# Patient Record
Sex: Female | Born: 1940
Health system: Southern US, Community
[De-identification: ages and names within clinical notes are randomized; demographics above are authoritative.]

## PROBLEM LIST (undated history)

## (undated) DIAGNOSIS — M810 Age-related osteoporosis without current pathological fracture: Secondary | ICD-10-CM

## (undated) DIAGNOSIS — E785 Hyperlipidemia, unspecified: Secondary | ICD-10-CM

## (undated) DIAGNOSIS — J189 Pneumonia, unspecified organism: Secondary | ICD-10-CM

## (undated) DIAGNOSIS — T7840XA Allergy, unspecified, initial encounter: Secondary | ICD-10-CM

## (undated) DIAGNOSIS — I10 Essential (primary) hypertension: Secondary | ICD-10-CM

## (undated) DIAGNOSIS — H409 Unspecified glaucoma: Secondary | ICD-10-CM

## (undated) HISTORY — DX: Allergy, unspecified, initial encounter: T78.40XA

## (undated) HISTORY — DX: Age-related osteoporosis without current pathological fracture: M81.0

## (undated) HISTORY — DX: Hyperlipidemia, unspecified: E78.5

## (undated) HISTORY — PX: DILATION AND CURETTAGE OF UTERUS: SHX78

## (undated) HISTORY — DX: Pneumonia, unspecified organism: J18.9

## (undated) HISTORY — DX: Essential (primary) hypertension: I10

## (undated) HISTORY — DX: Unspecified glaucoma: H40.9

## (undated) HISTORY — PX: WRIST FRACTURE SURGERY: SHX121

---

## 1998-05-16 ENCOUNTER — Ambulatory Visit (HOSPITAL_BASED_OUTPATIENT_CLINIC_OR_DEPARTMENT_OTHER): Admission: RE | Admit: 1998-05-16 | Discharge: 1998-05-16 | Payer: Self-pay | Admitting: Plastic Surgery

## 2002-08-12 HISTORY — PX: CATARACT EXTRACTION: SUR2

## 2004-07-26 ENCOUNTER — Ambulatory Visit: Payer: Self-pay

## 2005-09-16 ENCOUNTER — Ambulatory Visit: Payer: Self-pay | Admitting: Gastroenterology

## 2005-09-16 LAB — HM COLONOSCOPY: HM Colonoscopy: NORMAL

## 2006-02-18 ENCOUNTER — Ambulatory Visit: Payer: Self-pay

## 2007-06-04 ENCOUNTER — Ambulatory Visit: Payer: Self-pay

## 2008-08-09 ENCOUNTER — Ambulatory Visit: Payer: Self-pay

## 2009-11-23 ENCOUNTER — Ambulatory Visit: Payer: Self-pay

## 2010-09-15 ENCOUNTER — Inpatient Hospital Stay: Payer: Self-pay | Admitting: Orthopedic Surgery

## 2010-09-15 HISTORY — PX: HIP FRACTURE SURGERY: SHX118

## 2010-09-19 LAB — PATHOLOGY REPORT

## 2010-09-20 ENCOUNTER — Encounter: Payer: Self-pay | Admitting: Internal Medicine

## 2010-10-03 ENCOUNTER — Encounter: Payer: Self-pay | Admitting: Orthopedic Surgery

## 2010-10-11 ENCOUNTER — Encounter: Payer: Self-pay | Admitting: Orthopedic Surgery

## 2010-11-11 ENCOUNTER — Encounter: Payer: Self-pay | Admitting: Orthopedic Surgery

## 2011-02-21 ENCOUNTER — Ambulatory Visit: Payer: Self-pay

## 2011-04-10 ENCOUNTER — Encounter: Payer: Self-pay | Admitting: Orthopedic Surgery

## 2011-04-16 ENCOUNTER — Encounter: Payer: Self-pay | Admitting: Orthopedic Surgery

## 2011-04-26 ENCOUNTER — Ambulatory Visit: Payer: Self-pay | Admitting: Orthopedic Surgery

## 2011-05-13 ENCOUNTER — Encounter: Payer: Self-pay | Admitting: Orthopedic Surgery

## 2011-06-28 ENCOUNTER — Ambulatory Visit: Payer: Self-pay | Admitting: Anesthesiology

## 2011-07-16 ENCOUNTER — Encounter: Payer: Self-pay | Admitting: Anesthesiology

## 2011-08-13 ENCOUNTER — Encounter: Payer: Self-pay | Admitting: Anesthesiology

## 2011-09-20 ENCOUNTER — Encounter: Payer: Self-pay | Admitting: Family Medicine

## 2011-09-20 LAB — HM DIABETES EYE EXAM

## 2012-07-13 IMAGING — CR DG HIP COMPLETE 2+V*L*
1 series · 2 of 2 positions shown · non-contrast
Comparison: none

REASON FOR EXAM: FALL, LEFT HIP PAIN
COMMENTS:   May transport without cardiac monitor

[Series 1: view not recorded · 0.17mm/px · 2 of 2 slices shown]
[im 1/2]
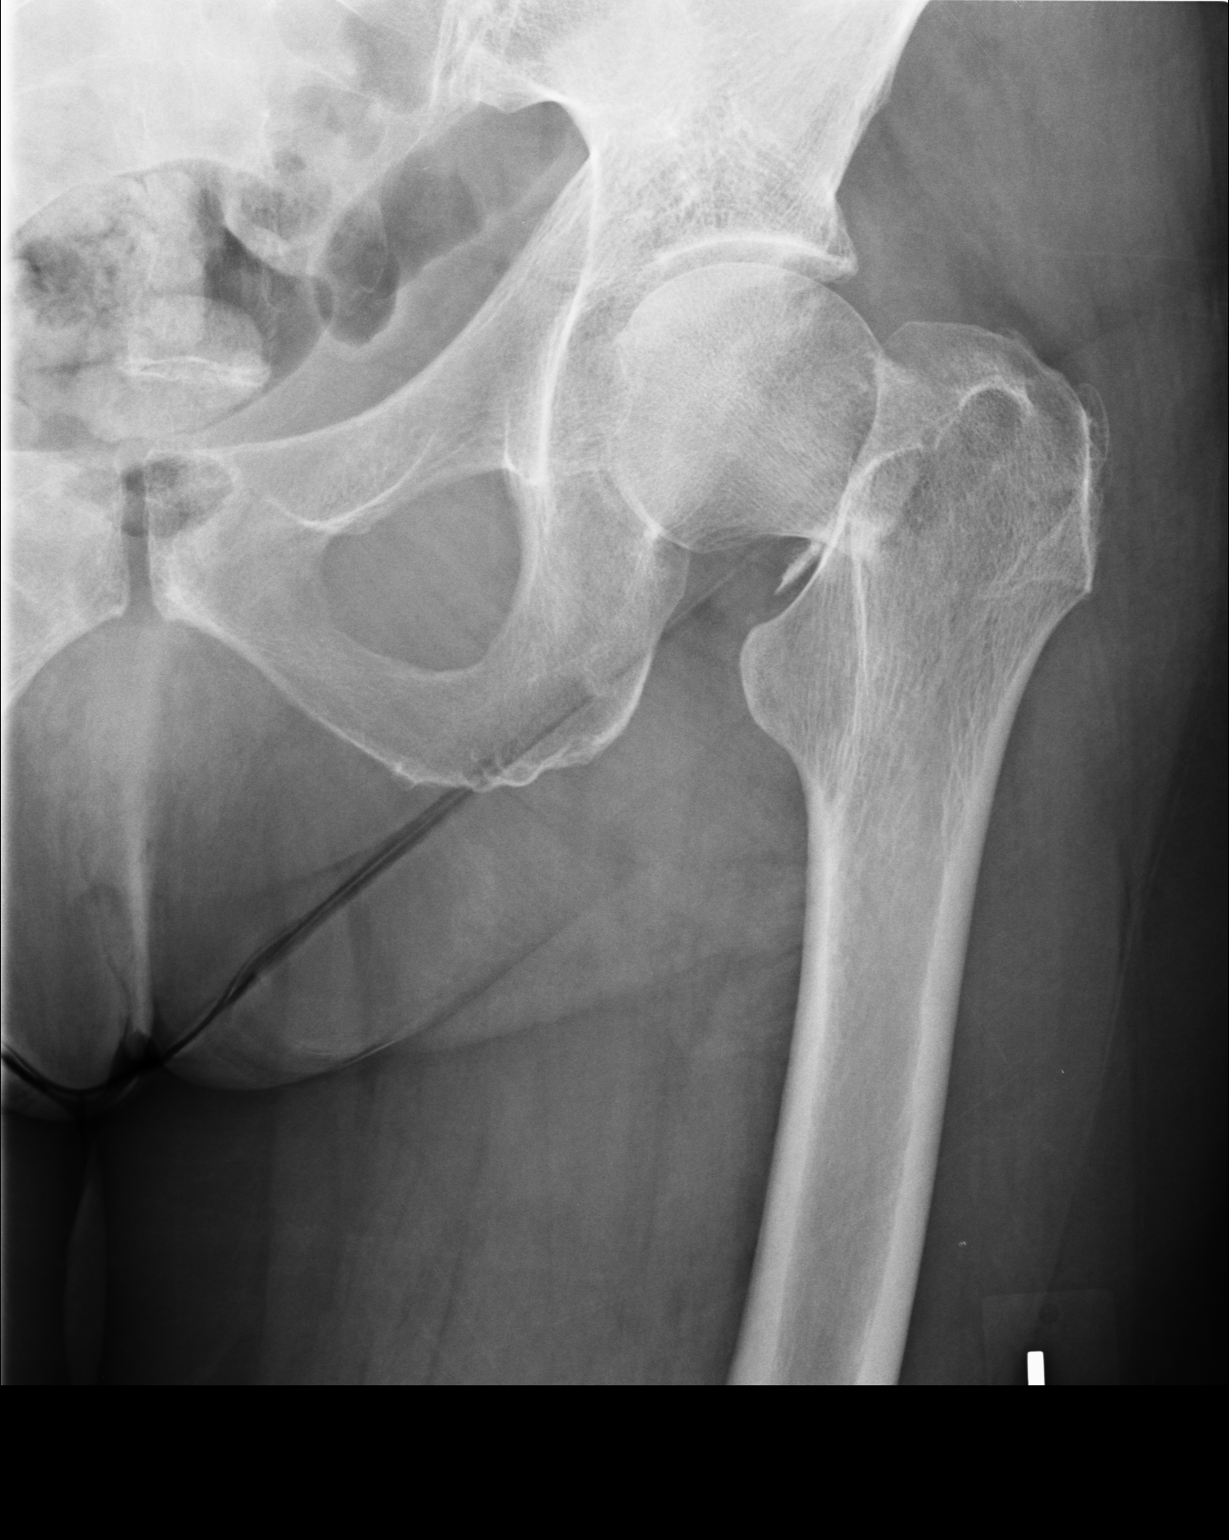
[im 2/2]
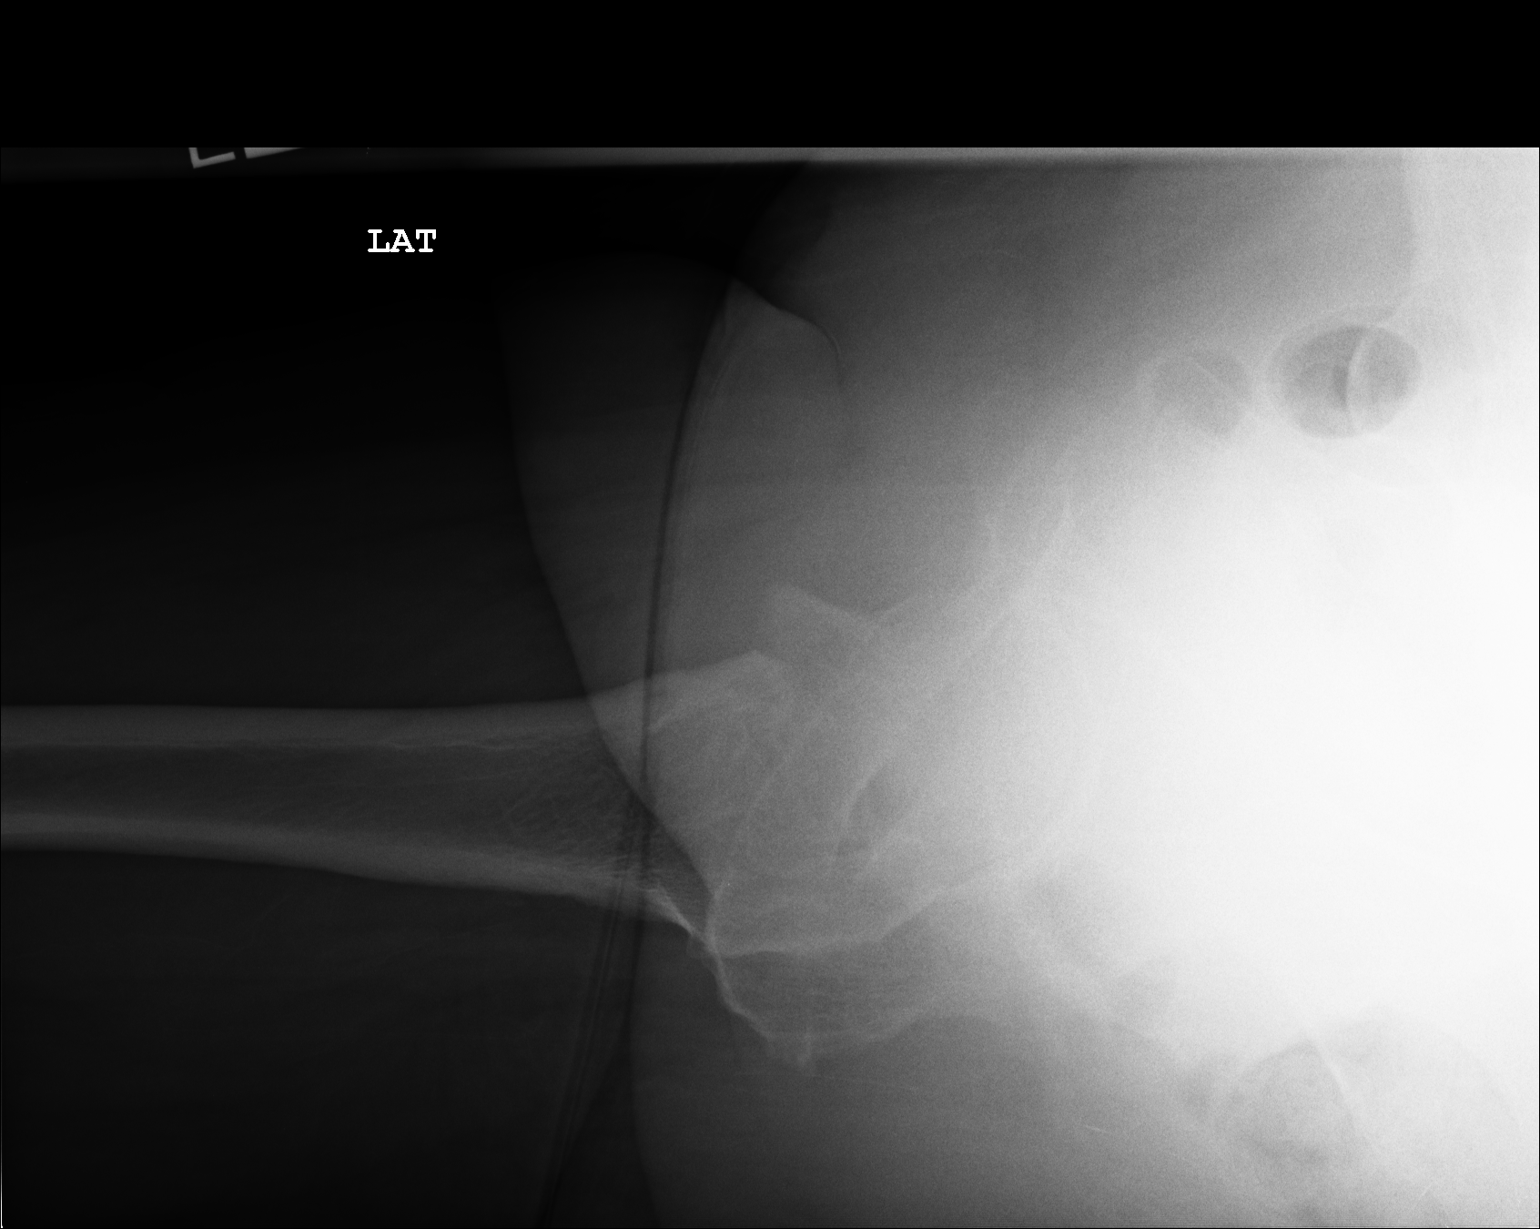

[2 of 2 positions shown; findings below may reference images not displayed]

PROCEDURE:     DXR - DXR HIP LEFT COMPLETE  - September 15, 2010  [DATE]

RESULT:     The patient has sustained an acute fracture at the base of the
neck of the left femur. There is angulation at the fracture site. The
intertrochanteric region appears normal. The femoral head remains normally
positioned in the acetabulum.
IMPRESSION: The patient has sustained an acute fracture of the base of
the neck of the left femur.

## 2012-07-14 IMAGING — CR DG HIP 1V*L*
1 series · 1 of 1 positions shown · non-contrast
Comparison: none

REASON FOR EXAM: post op
COMMENTS:   Bedside (portable):Y

PROCEDURE:     DXR - DXR HIP LEFT ONE VIEW  - September 16, 2010 [DATE]
RESULT:     A single crosstable lateral view of the left hip is submitted.
The patient has undergone left hip joint prosthesis placement.
Radiographically positioning of the prosthetic components is
radiographically good.

[view not recorded]
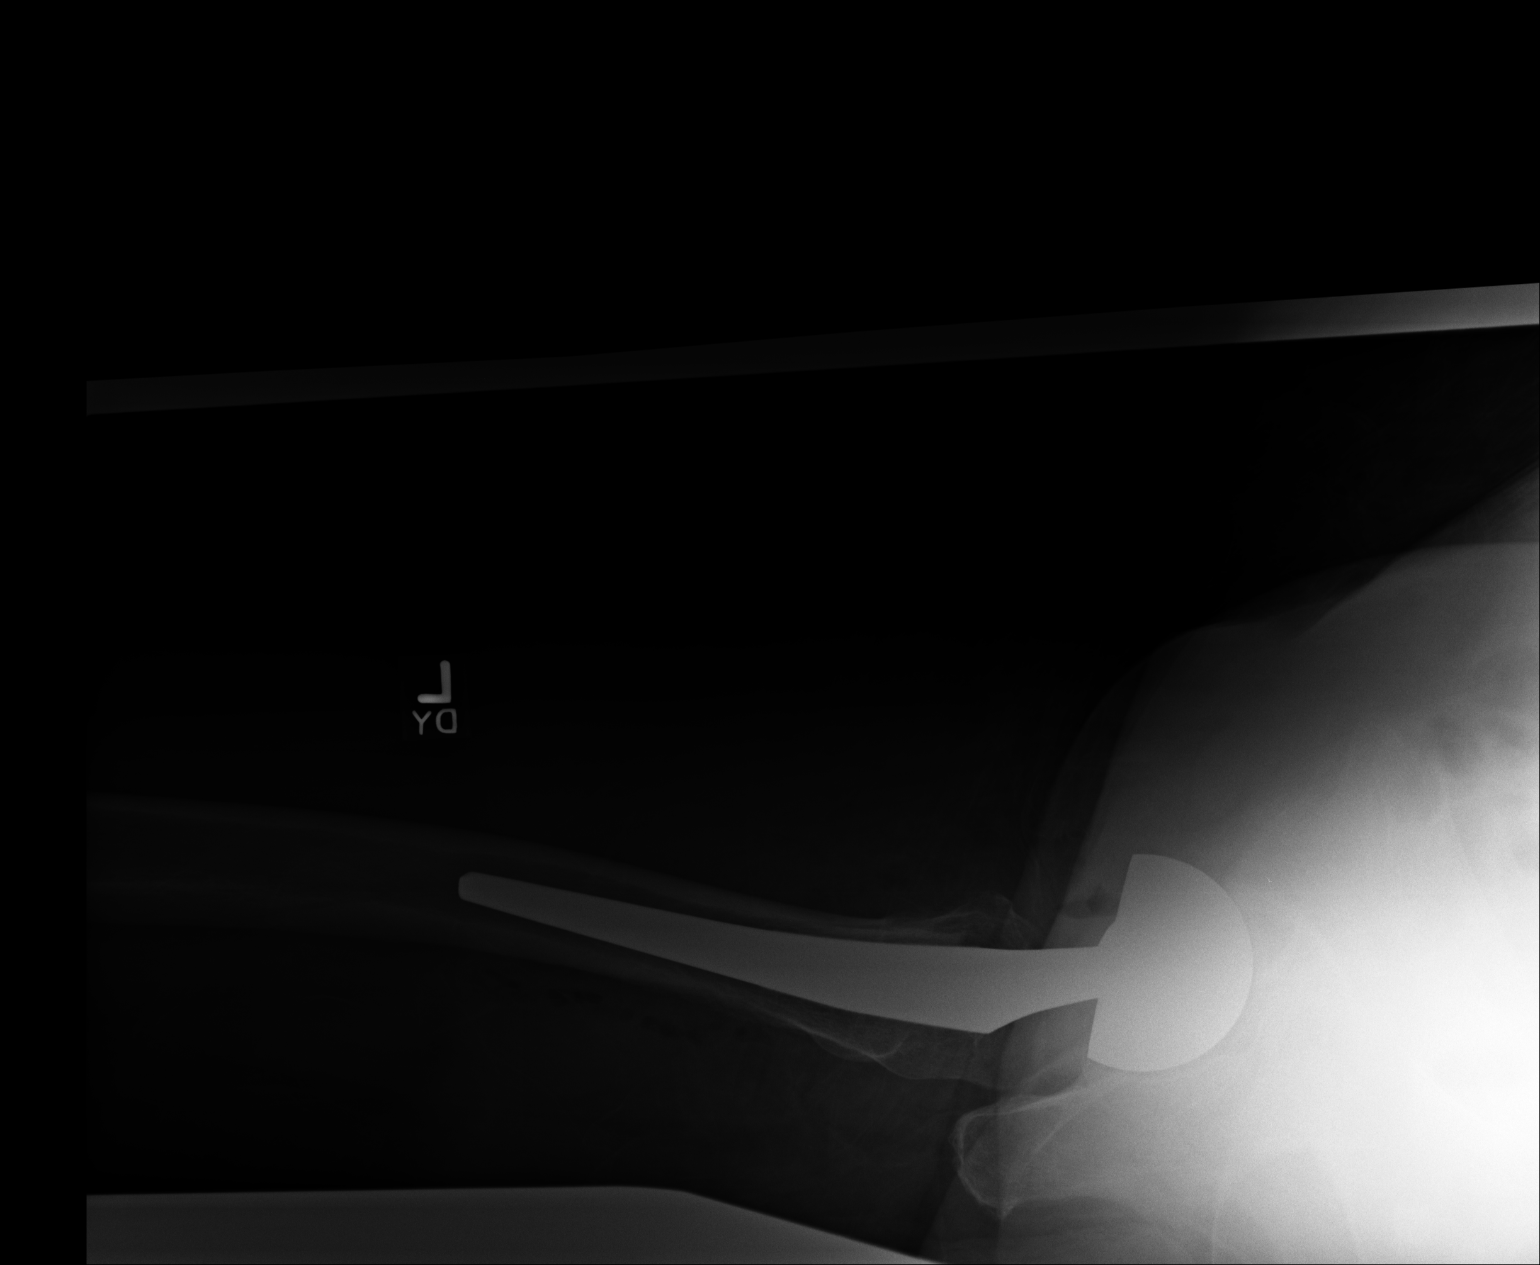

[1 of 1 positions shown; findings below may reference images not displayed]

IMPRESSION: The patient is undergone left hip joint prosthesis
placement. Further interpretation is deferred to Dr. Joselyn.

## 2012-08-06 ENCOUNTER — Ambulatory Visit: Payer: Self-pay | Admitting: Family Medicine

## 2012-09-04 ENCOUNTER — Ambulatory Visit: Payer: Self-pay | Admitting: Family Medicine

## 2012-09-18 ENCOUNTER — Ambulatory Visit: Payer: Self-pay | Admitting: Family Medicine

## 2012-09-29 ENCOUNTER — Ambulatory Visit: Payer: Self-pay | Admitting: Family Medicine

## 2013-05-20 ENCOUNTER — Ambulatory Visit: Payer: Self-pay | Admitting: Family Medicine

## 2013-06-14 ENCOUNTER — Ambulatory Visit: Payer: Self-pay | Admitting: Family Medicine

## 2013-08-13 LAB — HM MAMMOGRAPHY: HM Mammogram: NORMAL

## 2013-09-12 LAB — HM PAP SMEAR: HM PAP: NORMAL

## 2013-11-03 ENCOUNTER — Ambulatory Visit: Payer: Self-pay | Admitting: Family Medicine

## 2014-04-29 LAB — LIPID PANEL
CHOLESTEROL: 197 mg/dL (ref 0–200)
HDL: 83 mg/dL — AB (ref 35–70)
LDL Cholesterol: 103 mg/dL
TRIGLYCERIDES: 57 mg/dL (ref 40–160)

## 2014-07-03 IMAGING — CR DG CHEST 2V
1 series · 2 of 2 positions shown · non-contrast
Comparison: none

REASON FOR EXAM: cough
COMMENTS:

PROCEDURE:     KDR - KDXR CHEST PA (OR AP) AND LAT  - September 04, 2012 [DATE]
RESULT:
Comparison is made to a prior study dated 08/06/2012.

[Series 1: pa · 0.17mm/px · 2 of 2 slices shown]
[im 1/2]
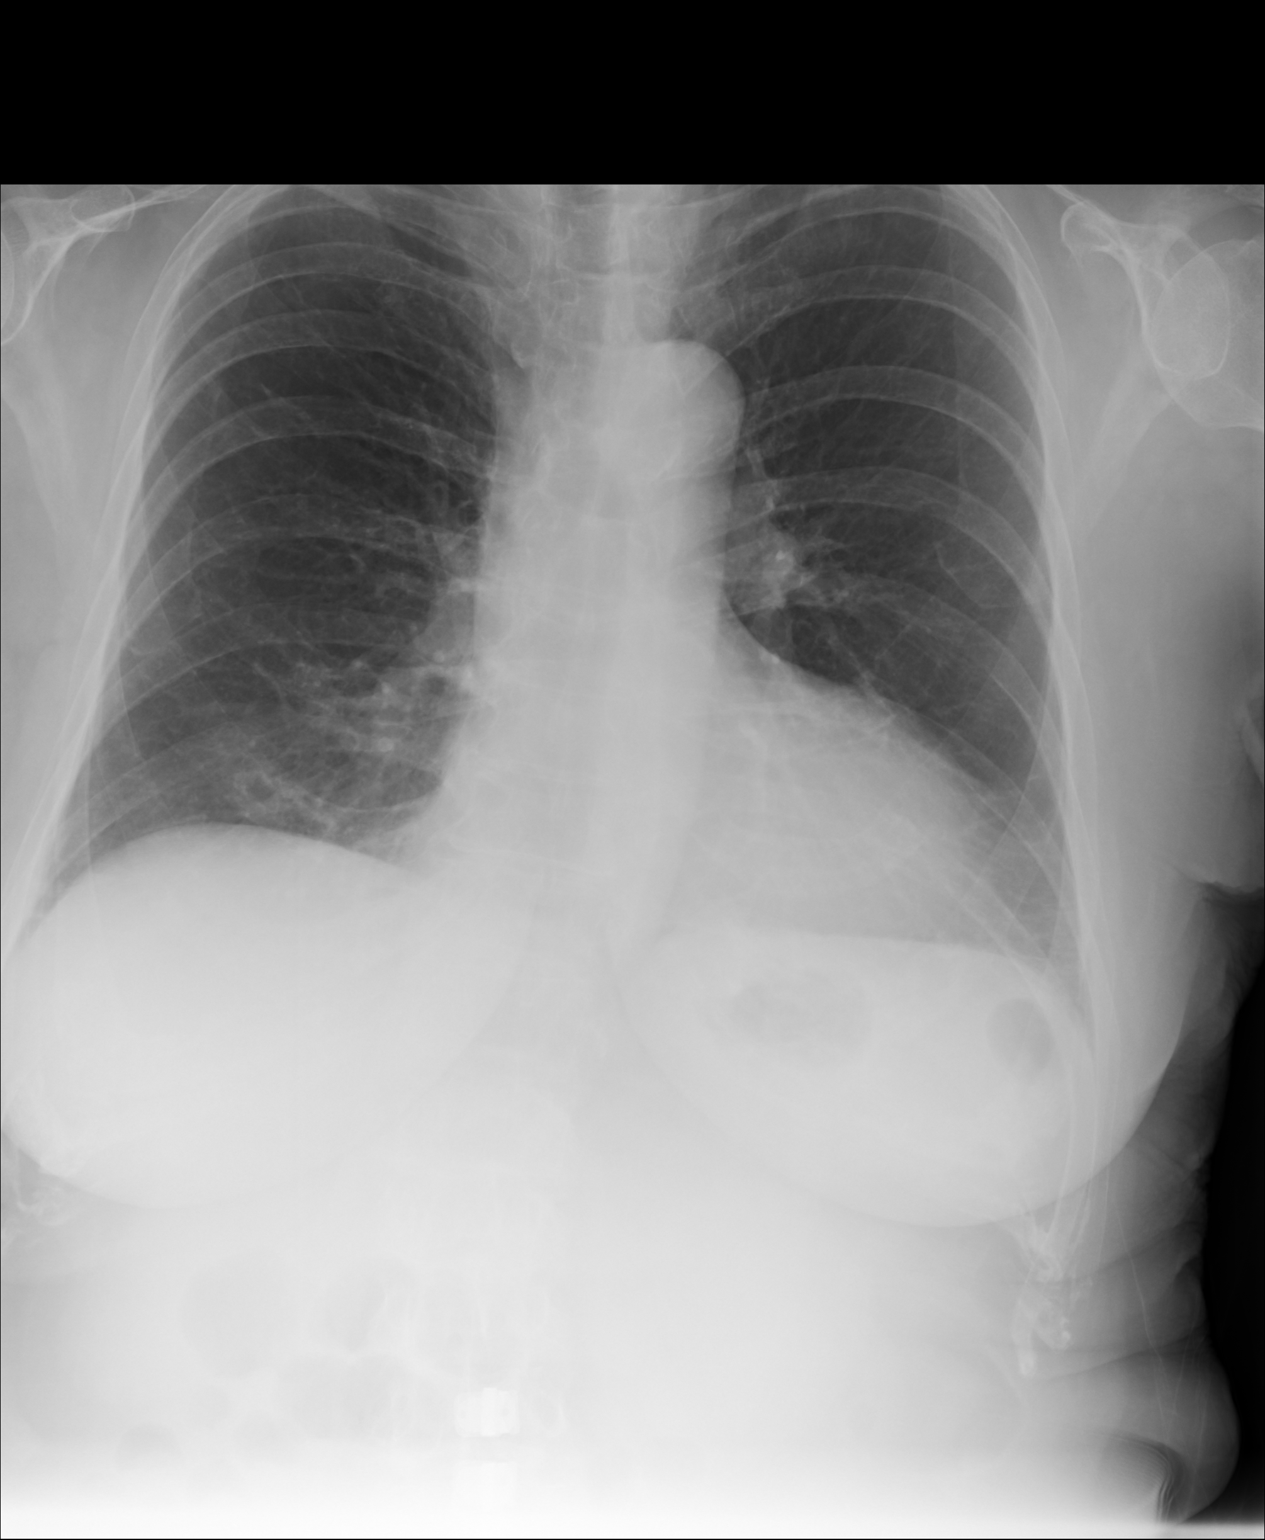
[im 2/2]
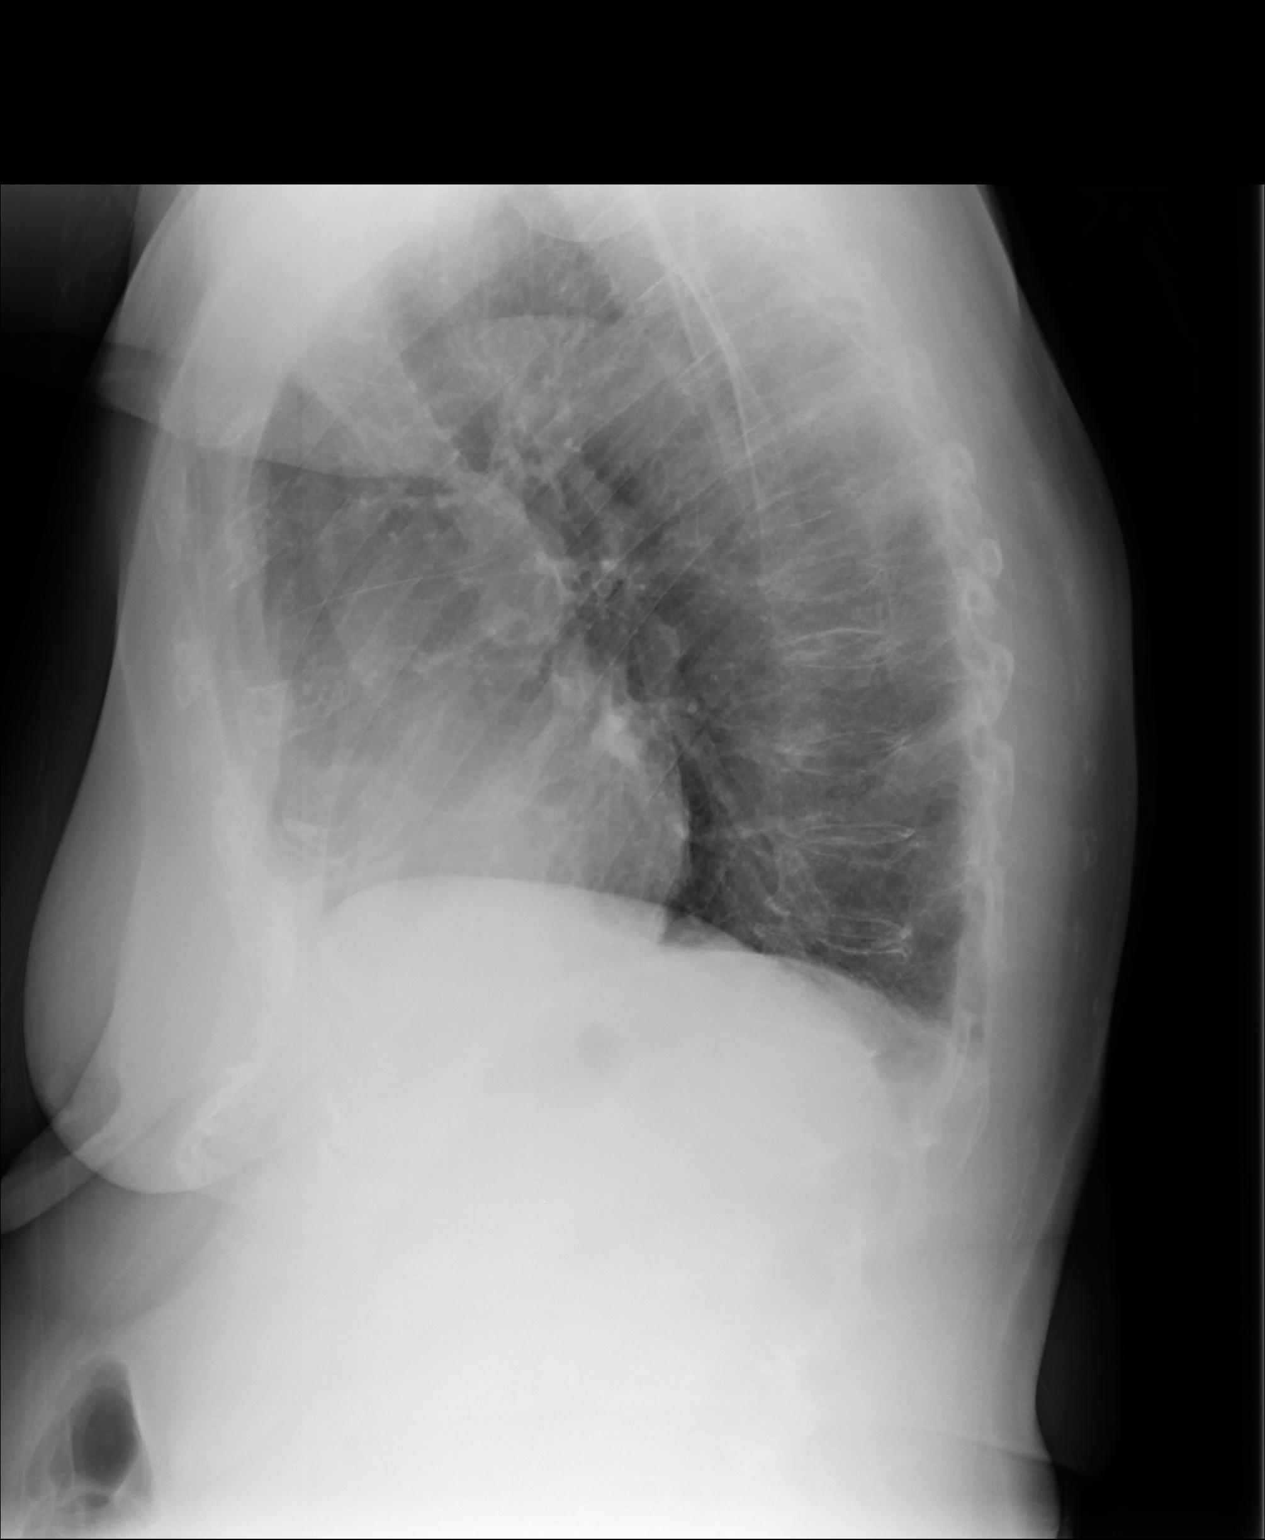

[2 of 2 positions shown; findings below may reference images not displayed]

FINDINGS: An area of increased density is appreciated within the right lung
base. This has a nodular component. This finding appears stable when
compared to the previous study. No new focal regions of consolidation or new
focal infiltrates are appreciated. The cardiac silhouette and visualized
bony skeleton is unremarkable.
IMPRESSION: Infiltrate within the right lower lobe, unchanged when
compared to the previous study. Surveillance evaluation is recommended.
Note, radiologic findings can lag behind clinical findings in the resolution
of pneumonitis.

## 2014-08-23 DIAGNOSIS — Z961 Presence of intraocular lens: Secondary | ICD-10-CM | POA: Diagnosis not present

## 2014-08-23 DIAGNOSIS — D2311 Other benign neoplasm of skin of right eyelid, including canthus: Secondary | ICD-10-CM | POA: Diagnosis not present

## 2014-08-23 DIAGNOSIS — H4011X1 Primary open-angle glaucoma, mild stage: Secondary | ICD-10-CM | POA: Diagnosis not present

## 2014-09-14 DIAGNOSIS — D2311 Other benign neoplasm of skin of right eyelid, including canthus: Secondary | ICD-10-CM | POA: Diagnosis not present

## 2014-11-07 LAB — BASIC METABOLIC PANEL
BUN: 12 mg/dL (ref 4–21)
CREATININE: 0.7 mg/dL (ref <=1.1)
Glucose: 94 mg/dL
POTASSIUM: 4.6 mmol/L (ref 3.4–5.3)
Sodium: 138 mmol/L (ref 137–147)

## 2014-11-07 LAB — HEMOGLOBIN A1C: Hgb A1c MFr Bld: 5.9 % (ref 4.0–6.0)

## 2014-11-07 LAB — HEPATIC FUNCTION PANEL
ALT: 11 U/L (ref 7–35)
AST: 22 U/L (ref 13–35)

## 2015-02-23 DIAGNOSIS — Z1231 Encounter for screening mammogram for malignant neoplasm of breast: Secondary | ICD-10-CM | POA: Diagnosis not present

## 2015-02-23 DIAGNOSIS — Z01419 Encounter for gynecological examination (general) (routine) without abnormal findings: Secondary | ICD-10-CM | POA: Diagnosis not present

## 2015-02-23 DIAGNOSIS — Z0000 Encounter for general adult medical examination without abnormal findings: Secondary | ICD-10-CM | POA: Diagnosis not present

## 2015-02-28 ENCOUNTER — Other Ambulatory Visit: Payer: Self-pay | Admitting: Family Medicine

## 2015-02-28 DIAGNOSIS — E785 Hyperlipidemia, unspecified: Secondary | ICD-10-CM | POA: Insufficient documentation

## 2015-02-28 NOTE — Telephone Encounter (Signed)
Last OV 10/2014  Thanks,   -Kaydin Labo  

## 2015-03-07 DIAGNOSIS — H26492 Other secondary cataract, left eye: Secondary | ICD-10-CM | POA: Diagnosis not present

## 2015-03-07 DIAGNOSIS — Z961 Presence of intraocular lens: Secondary | ICD-10-CM | POA: Diagnosis not present

## 2015-03-07 DIAGNOSIS — H4011X1 Primary open-angle glaucoma, mild stage: Secondary | ICD-10-CM | POA: Diagnosis not present

## 2015-03-29 ENCOUNTER — Ambulatory Visit (INDEPENDENT_AMBULATORY_CARE_PROVIDER_SITE_OTHER): Payer: Medicare PPO | Admitting: Family Medicine

## 2015-03-29 ENCOUNTER — Encounter: Payer: Self-pay | Admitting: Family Medicine

## 2015-03-29 VITALS — BP 140/98 | HR 72 | Temp 98.8°F | Resp 16 | Wt 159.0 lb

## 2015-03-29 DIAGNOSIS — R739 Hyperglycemia, unspecified: Secondary | ICD-10-CM | POA: Insufficient documentation

## 2015-03-29 DIAGNOSIS — J069 Acute upper respiratory infection, unspecified: Secondary | ICD-10-CM

## 2015-03-29 DIAGNOSIS — H4010X0 Unspecified open-angle glaucoma, stage unspecified: Secondary | ICD-10-CM | POA: Insufficient documentation

## 2015-03-29 DIAGNOSIS — R05 Cough: Secondary | ICD-10-CM

## 2015-03-29 DIAGNOSIS — J302 Other seasonal allergic rhinitis: Secondary | ICD-10-CM

## 2015-03-29 DIAGNOSIS — M519 Unspecified thoracic, thoracolumbar and lumbosacral intervertebral disc disorder: Secondary | ICD-10-CM

## 2015-03-29 DIAGNOSIS — J189 Pneumonia, unspecified organism: Secondary | ICD-10-CM

## 2015-03-29 DIAGNOSIS — S32010A Wedge compression fracture of first lumbar vertebra, initial encounter for closed fracture: Secondary | ICD-10-CM | POA: Insufficient documentation

## 2015-03-29 DIAGNOSIS — M1990 Unspecified osteoarthritis, unspecified site: Secondary | ICD-10-CM | POA: Insufficient documentation

## 2015-03-29 DIAGNOSIS — N6019 Diffuse cystic mastopathy of unspecified breast: Secondary | ICD-10-CM | POA: Insufficient documentation

## 2015-03-29 DIAGNOSIS — I10 Essential (primary) hypertension: Secondary | ICD-10-CM | POA: Insufficient documentation

## 2015-03-29 DIAGNOSIS — E559 Vitamin D deficiency, unspecified: Secondary | ICD-10-CM

## 2015-03-29 DIAGNOSIS — Z8781 Personal history of (healed) traumatic fracture: Secondary | ICD-10-CM | POA: Insufficient documentation

## 2015-03-29 DIAGNOSIS — R059 Cough, unspecified: Secondary | ICD-10-CM

## 2015-03-29 DIAGNOSIS — H409 Unspecified glaucoma: Secondary | ICD-10-CM

## 2015-03-29 HISTORY — DX: Pneumonia, unspecified organism: J18.9

## 2015-03-29 MED ORDER — AZITHROMYCIN 250 MG PO TABS: ORAL_TABLET | ORAL | Status: DC

## 2015-03-29 MED ORDER — BENZONATATE 100 MG PO CAPS: 100.0000 mg | ORAL_CAPSULE | Freq: Two times a day (BID) | ORAL | Status: DC | PRN

## 2015-03-29 NOTE — Progress Notes (Signed)
Patient: Anna Johnson Female    DOB: Mar 09, 1941   74 y.o.   MRN: [id number] Visit Date: 03/29/2015  Today's Provider: Lelon Huh, MD   Chief Complaint  Patient presents with  . Cough   Subjective:    HPI  Patient developed cough 03/27/2015. Patient has productive cough and rattling in her chest. Patient has been taking OTC mucus relief, that has not been helping. Patient has history of pneumonia and bronchitis. She states it improved somewhat yesterday, but became much more severe last night. Is now productive thick yellow mucous.   Allergies  Allergen Reactions  . Codeine   . Covera-Hs [Verapamil] Rash   Previous Medications   ALENDRONATE (FOSAMAX) 70 MG TABLET    Take 70 mg by mouth once a week. Take with a full glass of water on an empty stomach.   AMLODIPINE (NORVASC) 2.5 MG TABLET    Take 2.5 mg by mouth daily.   ASPIRIN 81 MG TABLET    Take 81 mg by mouth daily.   CHOLECALCIFEROL (VITAMIN D) 1000 UNITS TABLET    Take 1,000 Units by mouth daily.   CO-ENZYME Q-10 50 MG CAPSULE    Take 50 mg by mouth daily.   HYDROCHLOROTHIAZIDE (HYDRODIURIL) 12.5 MG TABLET    Take 12.5 mg by mouth daily.   LATANOPROST (XALATAN) 0.005 % OPHTHALMIC SOLUTION    Place 1 drop into both eyes at bedtime.   LORATADINE (CLARITIN) 10 MG TABLET    Take 10 mg by mouth daily as needed for allergies.   LOVASTATIN (MEVACOR) 40 MG TABLET    TAKE ONE (1) TABLET EACH DAY   MELOXICAM (MOBIC) 15 MG TABLET    Take 15 mg by mouth daily.   METOPROLOL SUCCINATE (TOPROL-XL) 50 MG 24 HR TABLET    Take 50 mg by mouth daily. Take with or immediately following a meal.   QUINAPRIL (ACCUPRIL) 40 MG TABLET    Take 40 mg by mouth at bedtime.   TIMOLOL (BETIMOL) 0.5 % OPHTHALMIC SOLUTION    Place 1 drop into both eyes daily.    Review of Systems  Constitutional: Negative for fever.  HENT: Positive for sinus pressure. Negative for ear pain and sore throat.   Respiratory: Positive for cough.   Cardiovascular:  Negative for chest pain and palpitations.  Neurological: Negative for dizziness, light-headedness and headaches.    Social History  Substance Use Topics  . Smoking status: Never Smoker   . Smokeless tobacco: Not on file  . Alcohol Use: 4.2 oz/week    7 Standard drinks or equivalent per week     Comment: Drinks wine almost everyday along with her meal   Objective:   BP 140/98 mmHg  Pulse 72  Temp(Src) 98.8 F (37.1 C) (Oral)  Resp 16  Wt 159 lb (72.122 kg)  SpO2 96%  Physical Exam  General Appearance:    Alert, cooperative, no distress  HENT:   bilateral TM normal without fluid or infection, neck without nodes, sinuses nontender, post nasal drip noted and nasal mucosa congested  Eyes:    PERRL, conjunctiva/corneas clear, EOM's intact       Lungs:     Clear to auscultation bilaterally, respirations unlabored  Heart:    Regular rate and rhythm  Neurologic:   Awake, alert, oriented x 3. No apparent focal neurological           defect.           Assessment &  Plan:     1. Upper respiratory infection  - azithromycin (ZITHROMAX) 250 MG tablet; 2 by mouth today, then 1 daily for 4 days  Dispense: 6 tablet; Refill: 0  2. Cough  - benzonatate (TESSALON) 100 MG capsule; Take 1 capsule (100 mg total) by mouth 2 (two) times daily as needed for cough.  Dispense: 20 capsule; Refill: 0        Lelon Huh, MD  [facility]

## 2015-04-07 ENCOUNTER — Other Ambulatory Visit: Payer: Self-pay | Admitting: Family Medicine

## 2015-04-07 DIAGNOSIS — M810 Age-related osteoporosis without current pathological fracture: Secondary | ICD-10-CM | POA: Insufficient documentation

## 2015-05-08 ENCOUNTER — Other Ambulatory Visit: Payer: Self-pay

## 2015-05-08 ENCOUNTER — Ambulatory Visit (INDEPENDENT_AMBULATORY_CARE_PROVIDER_SITE_OTHER): Payer: Medicare PPO | Admitting: Family Medicine

## 2015-05-08 ENCOUNTER — Encounter: Payer: Self-pay | Admitting: Family Medicine

## 2015-05-08 VITALS — BP 118/74 | HR 72 | Temp 98.1°F | Resp 16 | Wt 157.0 lb

## 2015-05-08 DIAGNOSIS — E785 Hyperlipidemia, unspecified: Secondary | ICD-10-CM

## 2015-05-08 DIAGNOSIS — I10 Essential (primary) hypertension: Secondary | ICD-10-CM | POA: Diagnosis not present

## 2015-05-08 DIAGNOSIS — Z23 Encounter for immunization: Secondary | ICD-10-CM

## 2015-05-08 DIAGNOSIS — M519 Unspecified thoracic, thoracolumbar and lumbosacral intervertebral disc disorder: Secondary | ICD-10-CM | POA: Insufficient documentation

## 2015-05-08 DIAGNOSIS — R9389 Abnormal findings on diagnostic imaging of other specified body structures: Secondary | ICD-10-CM | POA: Insufficient documentation

## 2015-05-08 DIAGNOSIS — N6019 Diffuse cystic mastopathy of unspecified breast: Secondary | ICD-10-CM | POA: Insufficient documentation

## 2015-05-08 DIAGNOSIS — R21 Rash and other nonspecific skin eruption: Secondary | ICD-10-CM | POA: Diagnosis not present

## 2015-05-08 DIAGNOSIS — R739 Hyperglycemia, unspecified: Secondary | ICD-10-CM

## 2015-05-08 LAB — POCT GLYCOSYLATED HEMOGLOBIN (HGB A1C): HEMOGLOBIN A1C: 5.5

## 2015-05-08 MED ORDER — LOVASTATIN 40 MG PO TABS: 40.0000 mg | ORAL_TABLET | Freq: Every day | ORAL | Status: DC

## 2015-05-08 MED ORDER — METOPROLOL SUCCINATE ER 50 MG PO TB24: 50.0000 mg | ORAL_TABLET | Freq: Every day | ORAL | Status: DC

## 2015-05-08 MED ORDER — HYDROCHLOROTHIAZIDE 12.5 MG PO TABS: 12.5000 mg | ORAL_TABLET | Freq: Every day | ORAL | Status: DC

## 2015-05-08 MED ORDER — QUINAPRIL HCL 40 MG PO TABS: 40.0000 mg | ORAL_TABLET | Freq: Every day | ORAL | Status: DC

## 2015-05-08 MED ORDER — AMLODIPINE BESYLATE 2.5 MG PO TABS: 2.5000 mg | ORAL_TABLET | Freq: Every day | ORAL | Status: DC

## 2015-05-08 NOTE — Progress Notes (Signed)
Subjective:    Patient ID: Anna Johnson, female    DOB: 1940/10/06, 74 y.o.   MRN: [id number]  Hypertension This is a chronic problem. The problem is unchanged. The problem is controlled. Pertinent negatives include no anxiety, blurred vision, chest pain, headaches, neck pain, peripheral edema, PND, shortness of breath or sweats. Risk factors for coronary artery disease include dyslipidemia. Treatments tried: Amlodipne 2.5 mg, HCTZ 12.5 mg, Metoprolol 50 mg, Quinapril 40 mg. There are no compliance problems.  There is no history of angina, kidney disease, CVA or heart failure.  Hyperlipidemia This is a chronic problem. The problem is controlled. Lipid results: 04/29/2014 Total- 197, Trig- 57, HDL- 83, LDL- 103. Pertinent negatives include no chest pain, myalgias or shortness of breath. Current antihyperlipidemic treatment includes statins (Lovastatin 40 mg). There are no compliance problems.  Risk factors for coronary artery disease include hypertension.  Hyperglycemia This is a chronic problem. Episode onset: Last A1C 11/07/2014 5.9% The problem has been unchanged. Associated symptoms include congestion (History of allergies), nausea (Only a day or so after taking Fosamax.) and a rash. Pertinent negatives include no abdominal pain, arthralgias, chest pain, coughing, fatigue, fever, headaches, joint swelling, myalgias, neck pain or vomiting.  Rash This is a new problem. The current episode started more than 1 month ago (Late June early July). The problem is unchanged. The affected locations include the lips. The rash is characterized by redness, swelling and itchiness. Associated symptoms include congestion (History of allergies). Pertinent negatives include no cough, diarrhea, fatigue, fever, shortness of breath or vomiting.  Pt is concern that Fosamax may be causing the rash and swelling.  She says she read it on the side effects.    Patient Active Problem List   Diagnosis Date Noted  .  Abnormal chest x-ray 05/08/2015  . Bloodgood disease 05/08/2015  . Thoracic, thoracolumbar and lumbosacral intervertebral disc disorder 05/08/2015  . HLD (hyperlipidemia) 05/08/2015  . BP (high blood pressure) 05/08/2015  . Avitaminosis D 05/08/2015  . Osteoporosis 04/07/2015  . Vitamin D deficiency 03/29/2015  . Glaucoma 03/29/2015  . Hypertension 03/29/2015  . Pneumonia 03/29/2015  . Seasonal allergies 03/29/2015  . Osteoarthritis 03/29/2015  . Disc disorder 03/29/2015  . Fibrocystic breast disease 03/29/2015  . Hyperglycemia 03/29/2015  . Compression fracture of L1 lumbar vertebra 03/29/2015  . History of hip fracture 03/29/2015  . Hyperlipemia 02/28/2015   Family History  Problem Relation Age of Onset  . Uterine cancer Mother   . Heart attack Father   . Healthy Brother    Social History   Social History  . Marital Status: Married    Spouse Name: Chrissie Noa  . Number of Children: 2  . Years of Education: College   Occupational History  . Retired Pharmacist, hospital    Social History Main Topics  . Smoking status: Never Smoker   . Smokeless tobacco: Never Used  . Alcohol Use: 4.2 oz/week    7 Standard drinks or equivalent per week     Comment: Drinks wine almost everyday along with her meal  . Drug Use: No  . Sexual Activity: Not on file   Other Topics Concern  . Not on file   Social History Narrative   Past Surgical History  Procedure Laterality Date  . Hip fracture surgery  09/15/2010    left hip  . Cataract extraction  2004  . Wrist fracture surgery      ORIF   Allergies  Allergen Reactions  . Codeine   .  Covera-Hs [Verapamil] Rash   Previous Medications   ALENDRONATE (FOSAMAX) 70 MG TABLET    TAKE ONE TABLET EVERY WEEK AS DIRECTED   ASPIRIN 81 MG TABLET    Take 81 mg by mouth daily.   CHOLECALCIFEROL (VITAMIN D) 1000 UNITS TABLET    Take 1,000 Units by mouth daily.   CO-ENZYME Q-10 50 MG CAPSULE    Take 50 mg by mouth daily.   FLUTICASONE (FLONASE) 50 MCG/ACT  NASAL SPRAY    Place into the nose.   LATANOPROST (XALATAN) 0.005 % OPHTHALMIC SOLUTION    Place 1 drop into both eyes at bedtime.   LORATADINE (CLARITIN) 10 MG TABLET    Take 10 mg by mouth daily as needed for allergies.   MELOXICAM (MOBIC) 15 MG TABLET    Take 15 mg by mouth daily.   TIMOLOL (BETIMOL) 0.5 % OPHTHALMIC SOLUTION    Place 1 drop into both eyes daily.   TIMOLOL (TIMOPTIC) 0.5 % OPHTHALMIC SOLUTION       BP 118/74 mmHg  Pulse 72  Temp(Src) 98.1 F (36.7 C) (Oral)  Resp 16  Wt 157 lb (71.215 kg)     Review of Systems  Constitutional: Negative.  Negative for fever and fatigue.  HENT: Positive for congestion (History of allergies).   Eyes: Negative for blurred vision.  Respiratory: Negative.  Negative for cough and shortness of breath.   Cardiovascular: Negative.  Negative for chest pain and PND.  Gastrointestinal: Positive for nausea (Only a day or so after taking Fosamax.). Negative for vomiting, abdominal pain, diarrhea, constipation, blood in stool, abdominal distention, anal bleeding and rectal pain.  Endocrine: Negative for cold intolerance, heat intolerance, polydipsia, polyphagia and polyuria.  Musculoskeletal: Positive for back pain (Chronic issue; massage therapy helps.). Negative for myalgias, joint swelling, arthralgias, gait problem, neck pain and neck stiffness.  Skin: Positive for rash.  Neurological: Negative for dizziness, light-headedness and headaches.       Objective:   Physical Exam  Constitutional: She is oriented to person, place, and time. She appears well-developed and well-nourished.  Cardiovascular: Normal rate and regular rhythm.   Pulmonary/Chest: Effort normal and breath sounds normal.  Neurological: She is alert and oriented to person, place, and time.  Skin: Skin is dry. Rash noted.  Around her lips. Scaly.  Erythematous.   Psychiatric: She has a normal mood and affect. Her behavior is normal. Judgment and thought content normal.     BP 118/74 mmHg  Pulse 72  Temp(Src) 98.1 F (36.7 C) (Oral)  Resp 16  Wt 157 lb (71.215 kg)   Results for orders placed or performed in visit on 05/08/15  POCT glycosylated hemoglobin (Hb A1C)  Result Value Ref Range   Hemoglobin A1C 5.5    Results for orders placed or performed in visit on 05/08/15  POCT glycosylated hemoglobin (Hb A1C)  Result Value Ref Range   Hemoglobin A1C 5.5        Assessment & Plan:  1. Hyperlipemia Condition is stable. Please continue current medication and  plan of care as noted.   - lovastatin (MEVACOR) 40 MG tablet; Take 1 tablet (40 mg total) by mouth at bedtime.  Dispense: 30 tablet; Refill: 5 - Lipid panel  2. Essential hypertension Condition is stable. Please continue current medication and  plan of care as noted.  Will refill medication today.  - amLODipine (NORVASC) 2.5 MG tablet; Take 1 tablet (2.5 mg total) by mouth daily.  Dispense: 30 tablet; Refill: 5 - hydrochlorothiazide (  HYDRODIURIL) 12.5 MG tablet; Take 1 tablet (12.5 mg total) by mouth daily.  Dispense: 30 tablet; Refill: 5 - metoprolol succinate (TOPROL-XL) 50 MG 24 hr tablet; Take 1 tablet (50 mg total) by mouth daily. Take with or immediately following a meal.  Dispense: 30 tablet; Refill: 5 - quinapril (ACCUPRIL) 40 MG tablet; Take 1 tablet (40 mg total) by mouth at bedtime.  Dispense: 30 tablet; Refill: 5 - TSH  3. Hyperglycemia BLood sugar is stable.  - POCT glycosylated hemoglobin (Hb A1C) - Comprehensive metabolic panel 4. Rash Unclear if related to lip products.  Will refer back to her dermatologist.  - CBC with Differential/Platelet  5. Flu vaccine need Given today.  - Flu vaccine HIGH DOSE PF (Fluzone High dose)  Margarita Rana, MD

## 2015-05-09 DIAGNOSIS — E785 Hyperlipidemia, unspecified: Secondary | ICD-10-CM | POA: Diagnosis not present

## 2015-05-09 DIAGNOSIS — R739 Hyperglycemia, unspecified: Secondary | ICD-10-CM | POA: Diagnosis not present

## 2015-05-09 DIAGNOSIS — I10 Essential (primary) hypertension: Secondary | ICD-10-CM | POA: Diagnosis not present

## 2015-05-09 DIAGNOSIS — R21 Rash and other nonspecific skin eruption: Secondary | ICD-10-CM | POA: Diagnosis not present

## 2015-05-10 ENCOUNTER — Telehealth: Payer: Self-pay

## 2015-05-10 LAB — CBC WITH DIFFERENTIAL/PLATELET
BASOS ABS: 0.1 10*3/uL (ref 0.0–0.2)
Basos: 1 %
EOS (ABSOLUTE): 0.3 10*3/uL (ref 0.0–0.4)
Eos: 6 %
HEMOGLOBIN: 14 g/dL (ref 11.1–15.9)
Hematocrit: 42.2 % (ref 34.0–46.6)
Immature Grans (Abs): 0 10*3/uL (ref 0.0–0.1)
Immature Granulocytes: 0 %
LYMPHS ABS: 1.3 10*3/uL (ref 0.7–3.1)
Lymphs: 23 %
MCH: 32.3 pg (ref 26.6–33.0)
MCHC: 33.2 g/dL (ref 31.5–35.7)
MCV: 97 fL (ref 79–97)
MONOCYTES: 14 %
MONOS ABS: 0.8 10*3/uL (ref 0.1–0.9)
NEUTROS PCT: 56 %
Neutrophils Absolute: 3.2 10*3/uL (ref 1.4–7.0)
Platelets: 287 10*3/uL (ref 150–379)
RBC: 4.34 x10E6/uL (ref 3.77–5.28)
RDW: 13.4 % (ref 12.3–15.4)
WBC: 5.7 10*3/uL (ref 3.4–10.8)

## 2015-05-10 LAB — COMPREHENSIVE METABOLIC PANEL
ALK PHOS: 45 IU/L (ref 39–117)
ALT: 11 IU/L (ref 0–32)
AST: 18 IU/L (ref 0–40)
Albumin/Globulin Ratio: 1.7 (ref 1.1–2.5)
Albumin: 4.1 g/dL (ref 3.5–4.8)
BUN/Creatinine Ratio: 18 (ref 11–26)
BUN: 12 mg/dL (ref 8–27)
Bilirubin Total: 0.5 mg/dL (ref 0.0–1.2)
CO2: 27 mmol/L (ref 18–29)
CREATININE: 0.67 mg/dL (ref 0.57–1.00)
Calcium: 9.4 mg/dL (ref 8.7–10.3)
Chloride: 100 mmol/L (ref 97–108)
GFR calc Af Amer: 99 mL/min/{1.73_m2} (ref >59)
GFR calc non Af Amer: 86 mL/min/{1.73_m2} (ref >59)
GLUCOSE: 97 mg/dL (ref 65–99)
Globulin, Total: 2.4 g/dL (ref 1.5–4.5)
Potassium: 4.2 mmol/L (ref 3.5–5.2)
Sodium: 141 mmol/L (ref 134–144)
Total Protein: 6.5 g/dL (ref 6.0–8.5)

## 2015-05-10 LAB — LIPID PANEL
CHOLESTEROL TOTAL: 179 mg/dL (ref 100–199)
Chol/HDL Ratio: 2.4 ratio units (ref 0.0–4.4)
HDL: 75 mg/dL (ref >39)
LDL CALC: 91 mg/dL (ref 0–99)
TRIGLYCERIDES: 63 mg/dL (ref 0–149)
VLDL CHOLESTEROL CAL: 13 mg/dL (ref 5–40)

## 2015-05-10 LAB — TSH: TSH: 2.11 u[IU]/mL (ref 0.450–4.500)

## 2015-05-10 NOTE — Telephone Encounter (Signed)
Advised  ED 

## 2015-05-10 NOTE — Telephone Encounter (Signed)
-----   Message from Margarita Rana, MD sent at 05/10/2015  9:46 AM EDT ----- Stable. Please notify patient. Thanks.

## 2015-05-10 NOTE — Telephone Encounter (Signed)
Pt returned your call.  Her cell is [phone]  Thanks Con Memos

## 2015-05-10 NOTE — Telephone Encounter (Signed)
LMTCB Emily Drozdowski, CMA  

## 2015-06-01 DIAGNOSIS — L570 Actinic keratosis: Secondary | ICD-10-CM | POA: Diagnosis not present

## 2015-06-01 DIAGNOSIS — L821 Other seborrheic keratosis: Secondary | ICD-10-CM | POA: Diagnosis not present

## 2015-06-01 DIAGNOSIS — K130 Diseases of lips: Secondary | ICD-10-CM | POA: Diagnosis not present

## 2015-06-01 DIAGNOSIS — L218 Other seborrheic dermatitis: Secondary | ICD-10-CM | POA: Diagnosis not present

## 2015-06-01 DIAGNOSIS — Z85828 Personal history of other malignant neoplasm of skin: Secondary | ICD-10-CM | POA: Diagnosis not present

## 2015-11-06 ENCOUNTER — Encounter: Payer: Medicare PPO | Admitting: Family Medicine

## 2015-11-23 ENCOUNTER — Ambulatory Visit (INDEPENDENT_AMBULATORY_CARE_PROVIDER_SITE_OTHER): Payer: Medicare Other | Admitting: Family Medicine

## 2015-11-23 ENCOUNTER — Encounter: Payer: Self-pay | Admitting: Family Medicine

## 2015-11-23 VITALS — BP 132/72 | HR 76 | Temp 98.1°F | Resp 16 | Ht 61.5 in | Wt 160.0 lb

## 2015-11-23 DIAGNOSIS — Z1211 Encounter for screening for malignant neoplasm of colon: Secondary | ICD-10-CM | POA: Diagnosis not present

## 2015-11-23 DIAGNOSIS — Z126 Encounter for screening for malignant neoplasm of bladder: Secondary | ICD-10-CM | POA: Diagnosis not present

## 2015-11-23 DIAGNOSIS — R739 Hyperglycemia, unspecified: Secondary | ICD-10-CM

## 2015-11-23 DIAGNOSIS — Z0000 Encounter for general adult medical examination without abnormal findings: Secondary | ICD-10-CM

## 2015-11-23 DIAGNOSIS — M1990 Unspecified osteoarthritis, unspecified site: Secondary | ICD-10-CM | POA: Diagnosis not present

## 2015-11-23 DIAGNOSIS — E785 Hyperlipidemia, unspecified: Secondary | ICD-10-CM

## 2015-11-23 DIAGNOSIS — I10 Essential (primary) hypertension: Secondary | ICD-10-CM

## 2015-11-23 DIAGNOSIS — J302 Other seasonal allergic rhinitis: Secondary | ICD-10-CM | POA: Diagnosis not present

## 2015-11-23 DIAGNOSIS — M810 Age-related osteoporosis without current pathological fracture: Secondary | ICD-10-CM

## 2015-11-23 LAB — POCT URINALYSIS DIPSTICK
Bilirubin, UA: NEGATIVE
Blood, UA: NEGATIVE
Glucose, UA: NEGATIVE
KETONES UA: NEGATIVE
NITRITE UA: NEGATIVE
PH UA: 6
PROTEIN UA: NEGATIVE
Spec Grav, UA: >=1.03
Urobilinogen, UA: 0.2

## 2015-11-23 MED ORDER — AMLODIPINE BESYLATE 2.5 MG PO TABS: 2.5000 mg | ORAL_TABLET | Freq: Every day | ORAL | Status: DC

## 2015-11-23 MED ORDER — FLUTICASONE PROPIONATE 50 MCG/ACT NA SUSP: 2.0000 | Freq: Every day | NASAL | Status: DC

## 2015-11-23 MED ORDER — HYDROCHLOROTHIAZIDE 12.5 MG PO TABS: 12.5000 mg | ORAL_TABLET | Freq: Every day | ORAL | Status: DC

## 2015-11-23 MED ORDER — QUINAPRIL HCL 40 MG PO TABS: 40.0000 mg | ORAL_TABLET | Freq: Every day | ORAL | Status: DC

## 2015-11-23 MED ORDER — ALENDRONATE SODIUM 70 MG PO TABS: ORAL_TABLET | ORAL | Status: DC

## 2015-11-23 MED ORDER — LOVASTATIN 40 MG PO TABS: 40.0000 mg | ORAL_TABLET | Freq: Every day | ORAL | Status: DC

## 2015-11-23 MED ORDER — MELOXICAM 15 MG PO TABS: 15.0000 mg | ORAL_TABLET | Freq: Every day | ORAL | Status: DC

## 2015-11-23 MED ORDER — METOPROLOL SUCCINATE ER 50 MG PO TB24: 50.0000 mg | ORAL_TABLET | Freq: Every day | ORAL | Status: DC

## 2015-11-23 NOTE — Progress Notes (Signed)
Patient: Anna Johnson, Female    DOB: 20-Jan-1941, 75 y.o.   MRN: [id number] Visit Date: 11/23/2015  Today's Provider: Margarita Rana, MD   Chief Complaint  Patient presents with  . Medicare Wellness   Subjective:    Annual wellness visit Anna Johnson is a 75 y.o. female. She feels well. She reports exercising once weekly; walks for about 20 minutes. She reports she is sleeping well.  Last Colonoscopy- 09/16/2005- Dr. Gustavo Lah. Colon WNL. Sees GYN for mammogram and Pap Smear. Pt had routine labs checked in September, 2016, and were stable. Pt has labs checked every 6 months. -----------------------------------------------------------   Review of Systems  HENT: Positive for dental problem and hearing loss.   Eyes: Positive for redness.  Musculoskeletal: Positive for myalgias and back pain.  Allergic/Immunologic: Positive for environmental allergies.  All other systems reviewed and are negative.   Social History   Social History  . Marital Status: Married    Spouse Name: Chrissie Noa  . Number of Children: 2  . Years of Education: College   Occupational History  . Retired Pharmacist, hospital    Social History Main Topics  . Smoking status: Never Smoker   . Smokeless tobacco: Never Used  . Alcohol Use: 4.2 oz/week    7 Standard drinks or equivalent per week     Comment: Drinks wine almost everyday along with her meal  . Drug Use: No  . Sexual Activity: Not on file   Other Topics Concern  . Not on file   Social History Narrative    Past Medical History  Diagnosis Date  . Hyperlipidemia   . Hypertension   . Allergy   . Glaucoma   . Osteoporosis      Patient Active Problem List   Diagnosis Date Noted  . Abnormal chest x-ray 05/08/2015  . Bloodgood disease 05/08/2015  . Thoracic, thoracolumbar and lumbosacral intervertebral disc disorder 05/08/2015  . HLD (hyperlipidemia) 05/08/2015  . BP (high blood pressure) 05/08/2015  . Avitaminosis D 05/08/2015  .  Osteoporosis 04/07/2015  . Vitamin D deficiency 03/29/2015  . Glaucoma 03/29/2015  . Hypertension 03/29/2015  . Pneumonia 03/29/2015  . Seasonal allergies 03/29/2015  . Osteoarthritis 03/29/2015  . Disc disorder 03/29/2015  . Fibrocystic breast disease 03/29/2015  . Hyperglycemia 03/29/2015  . Compression fracture of L1 lumbar vertebra (HCC) 03/29/2015  . History of hip fracture 03/29/2015  . Hyperlipemia 02/28/2015    Past Surgical History  Procedure Laterality Date  . Hip fracture surgery  09/15/2010    left hip  . Cataract extraction  2004  . Wrist fracture surgery      ORIF  . Dilation and curettage of uterus      Her family history includes Healthy in her brother; Heart attack in her father; Uterine cancer in her mother.    Previous Medications   ALENDRONATE (FOSAMAX) 70 MG TABLET    TAKE ONE TABLET EVERY WEEK AS DIRECTED   AMLODIPINE (NORVASC) 2.5 MG TABLET    Take 1 tablet (2.5 mg total) by mouth daily.   ASPIRIN 81 MG TABLET    Take 81 mg by mouth daily.   CHOLECALCIFEROL (VITAMIN D) 1000 UNITS TABLET    Take 1,000 Units by mouth daily.   CO-ENZYME Q-10 50 MG CAPSULE    Take 50 mg by mouth daily.   FLUTICASONE (FLONASE) 50 MCG/ACT NASAL SPRAY    Place into the nose. Reported on 11/23/2015   HYDROCHLOROTHIAZIDE (HYDRODIURIL) 12.5 MG TABLET  Take 1 tablet (12.5 mg total) by mouth daily.   LATANOPROST (XALATAN) 0.005 % OPHTHALMIC SOLUTION    Place 1 drop into both eyes at bedtime.   LORATADINE (CLARITIN) 10 MG TABLET    Take 10 mg by mouth daily as needed for allergies.   LOVASTATIN (MEVACOR) 40 MG TABLET    Take 1 tablet (40 mg total) by mouth at bedtime.   MELOXICAM (MOBIC) 15 MG TABLET    Take 15 mg by mouth daily. Reported on 11/23/2015   METOPROLOL SUCCINATE (TOPROL-XL) 50 MG 24 HR TABLET    Take 1 tablet (50 mg total) by mouth daily. Take with or immediately following a meal.   QUINAPRIL (ACCUPRIL) 40 MG TABLET    Take 1 tablet (40 mg total) by mouth at bedtime.    TIMOLOL (TIMOPTIC) 0.5 % OPHTHALMIC SOLUTION        Patient Care Team: Margarita Rana, MD as PCP - General (Family Medicine)     Objective:   Vitals: BP 132/72 mmHg  Pulse 76  Temp(Src) 98.1 F (36.7 C) (Oral)  Resp 16  Ht 5' 1.5" (1.562 m)  Wt 160 lb (72.576 kg)  BMI 29.75 kg/m2  Physical Exam  Constitutional: She is oriented to person, place, and time. She appears well-developed and well-nourished.  HENT:  Head: Normocephalic and atraumatic.  Right Ear: Tympanic membrane, external ear and ear canal normal.  Left Ear: Tympanic membrane, external ear and ear canal normal.  Nose: Nose normal.  Mouth/Throat: Uvula is midline, oropharynx is clear and moist and mucous membranes are normal.  Eyes: Conjunctivae, EOM and lids are normal. Pupils are equal, round, and reactive to light.  Neck: Trachea normal and normal range of motion. Neck supple. Carotid bruit is not present. No thyroid mass and no thyromegaly present.  Cardiovascular: Normal rate, regular rhythm and normal heart sounds.   Pulmonary/Chest: Effort normal and breath sounds normal.  Abdominal: Soft. Normal appearance and bowel sounds are normal. There is no hepatosplenomegaly. There is no tenderness.  Musculoskeletal: Normal range of motion.  Lymphadenopathy:    She has no cervical adenopathy.    She has no axillary adenopathy.  Neurological: She is alert and oriented to person, place, and time. She has normal strength. No cranial nerve deficit.  Skin: Skin is warm, dry and intact.  Psychiatric: She has a normal mood and affect. Her speech is normal and behavior is normal. Judgment and thought content normal. Cognition and memory are normal.    Activities of Daily Living In your present state of health, do you have any difficulty performing the following activities: 11/23/2015  Hearing? Y  Vision? N  Difficulty concentrating or making decisions? N  Walking or climbing stairs? Y  Dressing or bathing? N  Doing  errands, shopping? N    Fall Risk Assessment Fall Risk  11/23/2015  Falls in the past year? No     Depression Screen PHQ 2/9 Scores 11/23/2015  PHQ - 2 Score 0    Cognitive Testing - 6-CIT  Correct? Score   What year is it? yes 0 0 or 4  What month is it? yes 0 0 or 3  Memorize:    Pia Mau,  41,  [place],      What time is it? (within 1 hour) yes 0 0 or 3  Count backwards from 20 no 2 0, 2, or 4  Name the months of the year yes 0 0, 2, or 4  Repeat  name & address above yes 0 0, 2, 4, 6, 8, or 10       TOTAL SCORE  2/28   Interpretation:  Normal  Normal (0-7) Abnormal (8-28)       Assessment & Plan:     Annual Wellness Visit  Reviewed patient's Family Medical History Reviewed and updated list of patient's medical providers Assessment of cognitive impairment was done Assessed patient's functional ability Established a written schedule for health screening [place] Completed and Reviewed  Exercise Activities and Dietary recommendations Goals    None      Immunization History  Administered Date(s) Administered  . Influenza, High Dose Seasonal PF 05/08/2015  . Pneumococcal Conjugate-13 11/03/2013  . Pneumococcal Polysaccharide-23 06/04/2008  . Td 04/13/2008  . Tdap 04/29/2011  . Zoster 04/13/2008    Health Maintenance  Topic Date Due  . COLONOSCOPY  09/17/2015  . INFLUENZA VACCINE  03/12/2016  . TETANUS/TDAP  04/28/2021  . DEXA SCAN  Completed  . ZOSTAVAX  Completed  . PNA vac Low Risk Adult  Completed      Discussed health benefits of physical activity, and encouraged her to engage in regular exercise appropriate for her age and condition.    ------------------------------------------------------------------------------------------------------------  1. Medicare annual wellness visit, subsequent Stable. As above.  2. Bladder cancer screening Blood in UA was negative. Positive for leuks, but pt is  asymptomatic. Will not treat at this time. - POCT urinalysis dipstick Results for orders placed or performed in visit on 11/23/15  POCT urinalysis dipstick  Result Value Ref Range   Color, UA yellow    Clarity, UA clear    Glucose, UA Negative    Bilirubin, UA Negative    Ketones, UA Negative    Spec Grav, UA >=1.030    Blood, UA Negative    pH, UA 6.0    Protein, UA Negative    Urobilinogen, UA 0.2    Nitrite, UA Negative    Leukocytes, UA small (1+) (A) Negative     3. Essential hypertension Stable. Check labs and refill medications for 1 year. - CBC with Differential/Platelet - Comprehensive metabolic panel - quinapril (ACCUPRIL) 40 MG tablet; Take 1 tablet (40 mg total) by mouth at bedtime.  Dispense: 30 tablet; Refill: 11 - metoprolol succinate (TOPROL-XL) 50 MG 24 hr tablet; Take 1 tablet (50 mg total) by mouth daily. Take with or immediately following a meal.  Dispense: 30 tablet; Refill: 11 - hydrochlorothiazide (HYDRODIURIL) 12.5 MG tablet; Take 1 tablet (12.5 mg total) by mouth daily.  Dispense: 30 tablet; Refill: 5 - amLODipine (NORVASC) 2.5 MG tablet; Take 1 tablet (2.5 mg total) by mouth daily.  Dispense: 30 tablet; Refill: 11  4. Hyperglycemia Pt has H/O this. FU pending results. - Hemoglobin A1c  5. HLD (hyperlipidemia) Check labs and refill medications for 1 year. - TSH - Lipid panel  6. Osteoarthritis, unspecified osteoarthritis type, unspecified site Stable. Will refill Meloxicam. - meloxicam (MOBIC) 15 MG tablet; Take 1 tablet (15 mg total) by mouth daily. Reported on 11/23/2015  Dispense: 30 tablet; Refill: 5  7. Osteoporosis Refill meds for 1 year. - alendronate (FOSAMAX) 70 MG tablet; TAKE ONE TABLET EVERY WEEK AS DIRECTED  Dispense: 12 tablet; Refill: 4  8. Hyperlipemia Refill meds for 1 year. - lovastatin (MEVACOR) 40 MG tablet; Take 1 tablet (40 mg total) by mouth at bedtime.  Dispense: 30 tablet; Refill: 11  9. Seasonal allergies Refill meds  for 1 year. - fluticasone (FLONASE)  50 MCG/ACT nasal spray; Place 2 sprays into both nostrils daily. Reported on 11/23/2015  Dispense: 16 g; Refill: 11  10. Colon cancer screening Will order cologuard as below. Order signed and faxed. Will schedule colonoscopy pending results. - Cologuard   Patient seen and examined by Jerrell Belfast, MD, and note scribed by Renaldo Fiddler, CMA.  I have reviewed the document for accuracy and completeness and I agree with above. Jerrell Belfast, MD   Margarita Rana, MD

## 2015-11-25 LAB — COMPREHENSIVE METABOLIC PANEL
ALK PHOS: 43 IU/L (ref 39–117)
ALT: 9 IU/L (ref 0–32)
AST: 18 IU/L (ref 0–40)
Albumin/Globulin Ratio: 1.4 (ref 1.2–2.2)
Albumin: 3.9 g/dL (ref 3.5–4.8)
BILIRUBIN TOTAL: 0.4 mg/dL (ref 0.0–1.2)
BUN/Creatinine Ratio: 18 (ref 12–28)
BUN: 13 mg/dL (ref 8–27)
CO2: 27 mmol/L (ref 18–29)
Calcium: 9.4 mg/dL (ref 8.7–10.3)
Chloride: 97 mmol/L (ref 96–106)
Creatinine, Ser: 0.71 mg/dL (ref 0.57–1.00)
GFR calc non Af Amer: 84 mL/min/{1.73_m2} (ref >59)
GFR, EST AFRICAN AMERICAN: 96 mL/min/{1.73_m2} (ref >59)
GLUCOSE: 101 mg/dL — AB (ref 65–99)
Globulin, Total: 2.7 g/dL (ref 1.5–4.5)
POTASSIUM: 4.2 mmol/L (ref 3.5–5.2)
Sodium: 140 mmol/L (ref 134–144)
TOTAL PROTEIN: 6.6 g/dL (ref 6.0–8.5)

## 2015-11-25 LAB — CBC WITH DIFFERENTIAL/PLATELET
BASOS ABS: 0.1 10*3/uL (ref 0.0–0.2)
BASOS: 1 %
EOS (ABSOLUTE): 0.3 10*3/uL (ref 0.0–0.4)
Eos: 6 %
Hematocrit: 41.5 % (ref 34.0–46.6)
Hemoglobin: 14.1 g/dL (ref 11.1–15.9)
Immature Grans (Abs): 0 10*3/uL (ref 0.0–0.1)
Immature Granulocytes: 0 %
LYMPHS ABS: 1.3 10*3/uL (ref 0.7–3.1)
LYMPHS: 28 %
MCH: 32.6 pg (ref 26.6–33.0)
MCHC: 34 g/dL (ref 31.5–35.7)
MCV: 96 fL (ref 79–97)
MONOS ABS: 0.7 10*3/uL (ref 0.1–0.9)
Monocytes: 14 %
NEUTROS ABS: 2.5 10*3/uL (ref 1.4–7.0)
Neutrophils: 51 %
PLATELETS: 285 10*3/uL (ref 150–379)
RBC: 4.33 x10E6/uL (ref 3.77–5.28)
RDW: 13.1 % (ref 12.3–15.4)
WBC: 4.8 10*3/uL (ref 3.4–10.8)

## 2015-11-25 LAB — TSH: TSH: 1.9 u[IU]/mL (ref 0.450–4.500)

## 2015-11-25 LAB — LIPID PANEL
CHOLESTEROL TOTAL: 173 mg/dL (ref 100–199)
Chol/HDL Ratio: 2.1 ratio units (ref 0.0–4.4)
HDL: 83 mg/dL (ref >39)
LDL CALC: 80 mg/dL (ref 0–99)
TRIGLYCERIDES: 50 mg/dL (ref 0–149)
VLDL CHOLESTEROL CAL: 10 mg/dL (ref 5–40)

## 2015-11-25 LAB — HEMOGLOBIN A1C
Est. average glucose Bld gHb Est-mCnc: 117 mg/dL
Hgb A1c MFr Bld: 5.7 % — ABNORMAL HIGH (ref 4.8–5.6)

## 2015-11-27 ENCOUNTER — Telehealth: Payer: Self-pay

## 2015-11-27 NOTE — Telephone Encounter (Signed)
Left message to call back  

## 2015-11-27 NOTE — Telephone Encounter (Signed)
-----   Message from Margarita Rana, MD sent at 11/26/2015  6:58 AM EDT ----- Labs stable.  PLease notify patient. Thanks.

## 2015-11-27 NOTE — Telephone Encounter (Signed)
Advised patient as below.  

## 2015-12-04 LAB — COLOGUARD

## 2015-12-05 LAB — COLOGUARD: COLOGUARD: NEGATIVE

## 2016-04-04 ENCOUNTER — Telehealth: Payer: Self-pay | Admitting: Family Medicine

## 2016-04-04 NOTE — Telephone Encounter (Signed)
Pt was seeing Dr. Venia Minks and would like to tranfer to Dr. Rosanna Randy. Pt stated that she would rather see Dr. Rosanna Randy since Dr. Venia Minks is no longer in the office. Pt would like to schedule a follow up visit to transfer care. Please advise. Thanks TNP

## 2016-04-04 NOTE — Telephone Encounter (Signed)
Please review-aa 

## 2016-04-11 NOTE — Telephone Encounter (Signed)
Not taking new patients. I think Dr. Caryn Section will be willing to see her  and we should know about the new doctor in the next week

## 2016-04-11 NOTE — Telephone Encounter (Signed)
Left message/MW °

## 2016-04-11 NOTE — Telephone Encounter (Signed)
Please review-aa 

## 2016-04-11 NOTE — Telephone Encounter (Signed)
Will be due for follow up in October. Ok to schedule to establish and follow up chronic conditions then.

## 2016-04-17 NOTE — Telephone Encounter (Signed)
Appt is scheduled with Dr. Caryn Section for 05/29/16. Thanks TNP

## 2016-05-29 ENCOUNTER — Ambulatory Visit: Payer: Medicare Other | Admitting: Family Medicine

## 2016-05-30 ENCOUNTER — Ambulatory Visit (INDEPENDENT_AMBULATORY_CARE_PROVIDER_SITE_OTHER): Payer: Medicare Other | Admitting: Family Medicine

## 2016-05-30 ENCOUNTER — Encounter: Payer: Self-pay | Admitting: Family Medicine

## 2016-05-30 VITALS — BP 120/68 | HR 72 | Temp 98.1°F | Resp 16 | Wt 160.0 lb

## 2016-05-30 DIAGNOSIS — H4010X0 Unspecified open-angle glaucoma, stage unspecified: Secondary | ICD-10-CM

## 2016-05-30 DIAGNOSIS — E7800 Pure hypercholesterolemia, unspecified: Secondary | ICD-10-CM

## 2016-05-30 DIAGNOSIS — Z23 Encounter for immunization: Secondary | ICD-10-CM

## 2016-05-30 DIAGNOSIS — M810 Age-related osteoporosis without current pathological fracture: Secondary | ICD-10-CM

## 2016-05-30 DIAGNOSIS — E785 Hyperlipidemia, unspecified: Secondary | ICD-10-CM

## 2016-05-30 DIAGNOSIS — I10 Essential (primary) hypertension: Secondary | ICD-10-CM

## 2016-05-30 DIAGNOSIS — S32010S Wedge compression fracture of first lumbar vertebra, sequela: Secondary | ICD-10-CM

## 2016-05-30 DIAGNOSIS — R739 Hyperglycemia, unspecified: Secondary | ICD-10-CM | POA: Diagnosis not present

## 2016-05-30 NOTE — Progress Notes (Signed)
Patient: Anna Johnson Female    DOB: 12/25/1940   75 y.o.   MRN: [id number] Visit Date: 05/30/2016  Today's Provider: Lelon Huh, MD   Chief Complaint  Patient presents with  . Hypertension  . Hyperlipidemia  . Hyperglycemia   Subjective:    HPI  This is a previous patient of Dr. Venia Minks present today as new patient to me to establish care and follow up on chronic medical problems.    Hypertension, follow-up:  BP Readings from Last 3 Encounters:  05/30/16 120/68  11/23/15 132/72  05/08/15 118/74    She was last seen for hypertension 6 months ago.  BP at that visit was 132/72. Management since that visit includes . She reports good compliance with treatment. She is not having side effects.  She is not exercising. She is adherent to low salt diet.   Outside blood pressures are not being checked.  Patient denies chest pain, fatigue and lower extremity edema.   Cardiovascular risk factors include dyslipidemia.     Weight trend: stable Wt Readings from Last 3 Encounters:  05/30/16 160 lb (72.6 kg)  11/23/15 160 lb (72.6 kg)  05/08/15 157 lb (71.2 kg)    Current diet: well balanced     Lipid/Cholesterol, Follow-up:   Last seen for this6 months ago.  Management changes since that visit include no changes. . Last Lipid Panel:    Component Value Date/Time   CHOL 173 11/24/2015 0807   TRIG 50 11/24/2015 0807   HDL 83 11/24/2015 0807   CHOLHDL 2.1 11/24/2015 0807   LDLCALC 80 11/24/2015 0807    Risk factors for vascular disease include hypertension  She reports good compliance with treatment. She is not having side effects.  Current symptoms include none and have been stable. Weight trend: stable Prior visit with dietician: no Current diet: well balanced Current exercise: none  Wt Readings from Last 3 Encounters:  05/30/16 160 lb (72.6 kg)  11/23/15 160 lb (72.6 kg)  05/08/15 157 lb (71.2 kg)      Hyperglycemia, Follow-up:   Lab  Results  Component Value Date   HGBA1C 5.7 (H) 11/24/2015   HGBA1C 5.5 05/08/2015   HGBA1C 5.9 11/07/2014   GLUCOSE 101 (H) 11/24/2015   GLUCOSE 97 05/09/2015    Last seen for for this 6 months ago.  Management since then includes no changes. Current symptoms include none and have been stable.  Weight trend: stable Prior visit with dietician: no Current diet: well balanced Current exercise: none  Pertinent Labs:    Component Value Date/Time   CHOL 173 11/24/2015 0807   TRIG 50 11/24/2015 0807   CHOLHDL 2.1 11/24/2015 0807   CREATININE 0.71 11/24/2015 0807    Wt Readings from Last 3 Encounters:  05/30/16 160 lb (72.6 kg)  11/23/15 160 lb (72.6 kg)  05/08/15 157 lb (71.2 kg)       Allergies  Allergen Reactions  . Codeine   . Covera-Hs [Verapamil] Rash     Current Outpatient Prescriptions:  .  alendronate (FOSAMAX) 70 MG tablet, TAKE ONE TABLET EVERY WEEK AS DIRECTED, Disp: 12 tablet, Rfl: 4 .  amLODipine (NORVASC) 2.5 MG tablet, Take 1 tablet (2.5 mg total) by mouth daily., Disp: 30 tablet, Rfl: 11 .  aspirin 81 MG tablet, Take 81 mg by mouth daily., Disp: , Rfl:  .  cholecalciferol (VITAMIN D) 1000 UNITS tablet, Take 1,000 Units by mouth daily., Disp: , Rfl:  .  co-enzyme  Q-10 50 MG capsule, Take 50 mg by mouth daily., Disp: , Rfl:  .  fluticasone (FLONASE) 50 MCG/ACT nasal spray, Place 2 sprays into both nostrils daily. Reported on 11/23/2015, Disp: 16 g, Rfl: 11 .  hydrochlorothiazide (HYDRODIURIL) 12.5 MG tablet, Take 1 tablet (12.5 mg total) by mouth daily., Disp: 30 tablet, Rfl: 5 .  latanoprost (XALATAN) 0.005 % ophthalmic solution, Place 1 drop into both eyes at bedtime., Disp: , Rfl:  .  loratadine (CLARITIN) 10 MG tablet, Take 10 mg by mouth daily as needed for allergies., Disp: , Rfl:  .  lovastatin (MEVACOR) 40 MG tablet, Take 1 tablet (40 mg total) by mouth at bedtime., Disp: 30 tablet, Rfl: 11 .  meloxicam (MOBIC) 15 MG tablet, Take 1 tablet (15 mg  total) by mouth daily. Reported on 11/23/2015, Disp: 30 tablet, Rfl: 5 .  metoprolol succinate (TOPROL-XL) 50 MG 24 hr tablet, Take 1 tablet (50 mg total) by mouth daily. Take with or immediately following a meal., Disp: 30 tablet, Rfl: 11 .  quinapril (ACCUPRIL) 40 MG tablet, Take 1 tablet (40 mg total) by mouth at bedtime., Disp: 30 tablet, Rfl: 11 .  timolol (TIMOPTIC) 0.5 % ophthalmic solution, , Disp: , Rfl:   Review of Systems  Constitutional: Negative.   Respiratory: Negative.   Cardiovascular: Negative.   Endocrine: Negative.   Musculoskeletal: Negative.   Neurological: Negative.     Social History  Substance Use Topics  . Smoking status: Never Smoker  . Smokeless tobacco: Never Used  . Alcohol use 4.2 oz/week    7 Standard drinks or equivalent per week     Comment: Drinks wine almost everyday along with her meal   Objective:   BP 120/68 (BP Location: Left Arm, Patient Position: Sitting, Cuff Size: Normal)   Pulse 72   Temp 98.1 F (36.7 C)   Resp 16   Wt 160 lb (72.6 kg)   BMI 29.74 kg/m   Physical Exam   General Appearance:    Alert, cooperative, no distress  Eyes:    PERRL, conjunctiva/corneas clear, EOM's intact       Lungs:     Clear to auscultation bilaterally, respirations unlabored  Heart:    Regular rate and rhythm  Neurologic:   Awake, alert, oriented x 3. No apparent focal neurological           defect.           Assessment & Plan:     1. Essential hypertension Well controlled.  Continue current medications.   - Renal function panel  2. Pure hypercholesterolemia She is tolerating lovastatin well with no adverse effects.   - Lipid panel - Hepatic function panel  3. Hyperglycemia  - Hemoglobin A1c  4. Need for influenza vaccination  - Flu vaccine HIGH DOSE PF (Fluzone High dose)  5. Osteoporosis without current pathological fracture, unspecified osteoporosis type  - DG Bone Density; Future  6. Open-angle glaucoma, unspecified glaucoma  stage, unspecified laterality, unspecified open-angle glaucoma type Continue routine follow up ophthalmology  Return in about 6 months (around 11/28/2016) for Yearly Physical.      The entirety of the information documented in the History of Present Illness, Review of Systems and Physical Exam were personally obtained by me. Portions of this information were initially documented by Wilburt Finlay, CMA and reviewed by me for thoroughness and accuracy.    Lelon Huh, MD  [facility]

## 2016-06-01 LAB — HEPATIC FUNCTION PANEL
ALK PHOS: 41 IU/L (ref 39–117)
ALT: 14 IU/L (ref 0–32)
AST: 19 IU/L (ref 0–40)
Bilirubin Total: 0.5 mg/dL (ref 0.0–1.2)
Bilirubin, Direct: 0.17 mg/dL (ref 0.00–0.40)
Total Protein: 6.4 g/dL (ref 6.0–8.5)

## 2016-06-01 LAB — RENAL FUNCTION PANEL
ALBUMIN: 3.9 g/dL (ref 3.5–4.8)
BUN/Creatinine Ratio: 13 (ref 12–28)
BUN: 9 mg/dL (ref 8–27)
CO2: 26 mmol/L (ref 18–29)
Calcium: 9.1 mg/dL (ref 8.7–10.3)
Chloride: 97 mmol/L (ref 96–106)
Creatinine, Ser: 0.7 mg/dL (ref 0.57–1.00)
GFR calc Af Amer: 97 mL/min/{1.73_m2} (ref >59)
GFR, EST NON AFRICAN AMERICAN: 84 mL/min/{1.73_m2} (ref >59)
GLUCOSE: 99 mg/dL (ref 65–99)
PHOSPHORUS: 3 mg/dL (ref 2.5–4.5)
POTASSIUM: 4.2 mmol/L (ref 3.5–5.2)
Sodium: 137 mmol/L (ref 134–144)

## 2016-06-01 LAB — HEMOGLOBIN A1C
ESTIMATED AVERAGE GLUCOSE: 117 mg/dL
HEMOGLOBIN A1C: 5.7 % — AB (ref 4.8–5.6)

## 2016-06-01 LAB — LIPID PANEL
CHOL/HDL RATIO: 2.2 ratio (ref 0.0–4.4)
Cholesterol, Total: 165 mg/dL (ref 100–199)
HDL: 75 mg/dL (ref >39)
LDL Calculated: 80 mg/dL (ref 0–99)
TRIGLYCERIDES: 49 mg/dL (ref 0–149)
VLDL CHOLESTEROL CAL: 10 mg/dL (ref 5–40)

## 2016-06-03 ENCOUNTER — Telehealth: Payer: Self-pay

## 2016-06-03 NOTE — Telephone Encounter (Signed)
-----   Message from Birdie Sons, MD sent at 06/02/2016  8:49 AM EDT ----- Blood sugar, kidney functions, electrolytes and cholesterol are all normal. Check labs yearly.

## 2016-06-03 NOTE — Telephone Encounter (Signed)
Tried calling Patient. No answer. Left message to call back.

## 2016-06-03 NOTE — Telephone Encounter (Signed)
Pt returned call regarding labs  [phone]  Thanks teri

## 2016-06-04 NOTE — Telephone Encounter (Signed)
Left message to call back  

## 2016-06-05 NOTE — Telephone Encounter (Signed)
Patient advised of results.

## 2016-07-11 ENCOUNTER — Ambulatory Visit
Admission: RE | Admit: 2016-07-11 | Discharge: 2016-07-11 | Disposition: A | Discharge status: Home / Self Care | Payer: Medicare Other | Source: Ambulatory Visit | Attending: Family Medicine | Admitting: Family Medicine

## 2016-07-11 DIAGNOSIS — E2839 Other primary ovarian failure: Secondary | ICD-10-CM | POA: Insufficient documentation

## 2016-07-11 DIAGNOSIS — M8589 Other specified disorders of bone density and structure, multiple sites: Secondary | ICD-10-CM | POA: Insufficient documentation

## 2016-07-11 DIAGNOSIS — M810 Age-related osteoporosis without current pathological fracture: Secondary | ICD-10-CM

## 2016-07-12 ENCOUNTER — Encounter: Payer: Self-pay | Admitting: Family Medicine

## 2016-07-12 ENCOUNTER — Telehealth: Payer: Self-pay

## 2016-07-12 NOTE — Telephone Encounter (Signed)
Left message to call back  

## 2016-07-12 NOTE — Telephone Encounter (Signed)
Pt advised-aa 

## 2016-07-12 NOTE — Telephone Encounter (Signed)
-----   Message from Birdie Sons, MD sent at 07/12/2016  8:07 AM EST ----- Bone density test is stable from 2014. Shows borderline osteoporosis. Continue once weekly alendronate. Repeat BMD in 2-3 years.

## 2016-07-22 ENCOUNTER — Other Ambulatory Visit: Payer: Self-pay | Admitting: Family Medicine

## 2016-07-22 DIAGNOSIS — I10 Essential (primary) hypertension: Secondary | ICD-10-CM

## 2016-11-19 ENCOUNTER — Other Ambulatory Visit: Payer: Self-pay | Admitting: Obstetrics & Gynecology

## 2016-11-19 DIAGNOSIS — Z1231 Encounter for screening mammogram for malignant neoplasm of breast: Secondary | ICD-10-CM

## 2016-11-27 ENCOUNTER — Other Ambulatory Visit: Payer: Self-pay | Admitting: Family Medicine

## 2016-11-27 ENCOUNTER — Encounter: Payer: Medicare Other | Admitting: Family Medicine

## 2016-11-27 ENCOUNTER — Ambulatory Visit: Payer: Medicare Other

## 2016-11-27 DIAGNOSIS — I10 Essential (primary) hypertension: Secondary | ICD-10-CM

## 2016-11-27 MED ORDER — QUINAPRIL HCL 40 MG PO TABS: 40.0000 mg | ORAL_TABLET | Freq: Every day | ORAL | 11 refills | Status: DC

## 2016-11-27 NOTE — Telephone Encounter (Signed)
[facility] faxed a request on the following medication. Thanks CC  quinapril (ACCUPRIL) 40 MG tablet  Take one tablet at bedtime.

## 2016-12-04 ENCOUNTER — Ambulatory Visit (INDEPENDENT_AMBULATORY_CARE_PROVIDER_SITE_OTHER): Payer: Medicare Other

## 2016-12-04 ENCOUNTER — Encounter: Payer: Self-pay | Admitting: Family Medicine

## 2016-12-04 ENCOUNTER — Ambulatory Visit (INDEPENDENT_AMBULATORY_CARE_PROVIDER_SITE_OTHER): Payer: Medicare Other | Admitting: Family Medicine

## 2016-12-04 VITALS — BP 140/78 | HR 72 | Temp 97.6°F | Ht 62.0 in | Wt 159.4 lb

## 2016-12-04 DIAGNOSIS — R739 Hyperglycemia, unspecified: Secondary | ICD-10-CM | POA: Diagnosis not present

## 2016-12-04 DIAGNOSIS — M810 Age-related osteoporosis without current pathological fracture: Secondary | ICD-10-CM | POA: Diagnosis not present

## 2016-12-04 DIAGNOSIS — Z1211 Encounter for screening for malignant neoplasm of colon: Secondary | ICD-10-CM | POA: Diagnosis not present

## 2016-12-04 DIAGNOSIS — I10 Essential (primary) hypertension: Secondary | ICD-10-CM

## 2016-12-04 DIAGNOSIS — E785 Hyperlipidemia, unspecified: Secondary | ICD-10-CM

## 2016-12-04 DIAGNOSIS — Z0000 Encounter for general adult medical examination without abnormal findings: Secondary | ICD-10-CM | POA: Diagnosis not present

## 2016-12-04 LAB — POCT GLYCOSYLATED HEMOGLOBIN (HGB A1C)
ESTIMATED AVERAGE GLUCOSE: 120
Hemoglobin A1C: 5.8

## 2016-12-04 MED ORDER — ALENDRONATE SODIUM 70 MG PO TABS: ORAL_TABLET | ORAL | 4 refills | Status: DC

## 2016-12-04 MED ORDER — METOPROLOL SUCCINATE ER 50 MG PO TB24: 50.0000 mg | ORAL_TABLET | Freq: Every day | ORAL | 11 refills | Status: DC

## 2016-12-04 MED ORDER — LOVASTATIN 40 MG PO TABS: 40.0000 mg | ORAL_TABLET | Freq: Every day | ORAL | 11 refills | Status: DC

## 2016-12-04 MED ORDER — AMLODIPINE BESYLATE 2.5 MG PO TABS: 2.5000 mg | ORAL_TABLET | Freq: Every day | ORAL | 11 refills | Status: DC

## 2016-12-04 NOTE — Progress Notes (Signed)
Patient: Anna Johnson Female    DOB: Jun 01, 1941   76 y.o.   MRN: [id number] Visit Date: 12/04/2016  Today's Provider: Lelon Huh, MD   Chief Complaint  Patient presents with  . Follow-up  . Hypertension  . Hyperlipidemia  . Hyperglycemia   Subjective:    Patient saw McKenzie proior to office visit for AWV.      Hypertension, follow-up:  BP Readings from Last 3 Encounters:  12/04/16 140/78  05/30/16 120/68  11/23/15 132/72    She was last seen for hypertension 6 months ago.  BP at that visit was 120/68. Management since that visit includes; labs checked, no changes.She reports good compliance with treatment. She is not having side effects. none She is exercising. She is adherent to low salt diet.   Outside blood pressures are not checking. She is experiencing none.  Patient denies none.   Cardiovascular risk factors include none.  Use of agents associated with hypertension: none.   ----------------------------------------------------------------    Lipid/Cholesterol, Follow-up:   Last seen for this 6 months ago.  Management since that visit includes; labs checked, no changes.  Last Lipid Panel:    Component Value Date/Time   CHOL 165 05/31/2016 0824   TRIG 49 05/31/2016 0824   HDL 75 05/31/2016 0824   CHOLHDL 2.2 05/31/2016 0824   LDLCALC 80 05/31/2016 0824    She reports good compliance with treatment. She is not having side effects. none  Wt Readings from Last 3 Encounters:  12/04/16 159 lb 6.4 oz (72.3 kg)  05/30/16 160 lb (72.6 kg)  11/23/15 160 lb (72.6 kg)    ----------------------------------------------------------------  Hyperglycemia From 05/30/2016-labs checked, no changes. Last Hemoglobin A1c-5.7   Allergies  Allergen Reactions  . Codeine   . Covera-Hs [Verapamil] Rash     Current Outpatient Prescriptions:  .  Acetaminophen (ACETAMINOPHEN EXTRA STRENGTH) 500 MG coapsule, Take 2 capsules by mouth as needed for  fever., Disp: , Rfl:  .  alendronate (FOSAMAX) 70 MG tablet, TAKE ONE TABLET EVERY WEEK AS DIRECTED, Disp: 12 tablet, Rfl: 4 .  amLODipine (NORVASC) 2.5 MG tablet, Take 1 tablet (2.5 mg total) by mouth daily., Disp: 30 tablet, Rfl: 11 .  aspirin 81 MG tablet, Take 81 mg by mouth daily., Disp: , Rfl:  .  cholecalciferol (VITAMIN D) 1000 UNITS tablet, Take 2,000 Units by mouth daily. , Disp: , Rfl:  .  co-enzyme Q-10 50 MG capsule, Take 50 mg by mouth daily., Disp: , Rfl:  .  Coenzyme Q10 (CO Q-10) 200 MG CAPS, Take 200 mg by mouth daily., Disp: , Rfl:  .  fluticasone (FLONASE) 50 MCG/ACT nasal spray, Place 2 sprays into both nostrils daily. Reported on 11/23/2015 (Patient taking differently: Place 2 sprays into both nostrils daily. Reported on 11/23/2015), Disp: 16 g, Rfl: 11 .  hydrochlorothiazide (HYDRODIURIL) 12.5 MG tablet, TAKE ONE (1) TABLET EACH DAY, Disp: 90 tablet, Rfl: 4 .  latanoprost (XALATAN) 0.005 % ophthalmic solution, Place 1 drop into both eyes at bedtime., Disp: , Rfl:  .  loratadine (CLARITIN) 10 MG tablet, Take 10 mg by mouth daily as needed for allergies., Disp: , Rfl:  .  lovastatin (MEVACOR) 40 MG tablet, Take 1 tablet (40 mg total) by mouth at bedtime., Disp: 30 tablet, Rfl: 11 .  meloxicam (MOBIC) 15 MG tablet, Take 1 tablet (15 mg total) by mouth daily. Reported on 11/23/2015 (Patient not taking: Reported on 12/04/2016), Disp: 30 tablet, Rfl: 5 .  metoprolol succinate (TOPROL-XL) 50 MG 24 hr tablet, Take 1 tablet (50 mg total) by mouth daily. Take with or immediately following a meal., Disp: 30 tablet, Rfl: 11 .  quinapril (ACCUPRIL) 40 MG tablet, Take 1 tablet (40 mg total) by mouth at bedtime., Disp: 30 tablet, Rfl: 11 .  timolol (TIMOPTIC) 0.5 % ophthalmic solution, Place 1 drop into both eyes daily. , Disp: , Rfl:   Review of Systems  HENT: Positive for dental problem.   Eyes: Positive for redness.  Musculoskeletal: Positive for arthralgias and back pain.  All other  systems reviewed and are negative.   Social History  Substance Use Topics  . Smoking status: Never Smoker  . Smokeless tobacco: Never Used  . Alcohol use 12.6 oz/week    7 Standard drinks or equivalent, 7 Glasses of wine, 7 Shots of liquor per week   Objective:    Vital Signs - Last Recorded  Most recent update: 12/04/2016 1:41 PM by Fabio Neighbors, LPN  BP  427/06 (BP Location: Right Arm)     Pulse  72     Temp  97.6 F (36.4 C) (Oral)     Ht  5\' 2"  (1.575 m)     Wt  159 lb 6.4 oz (72.3 kg)      BMI  29.15 kg/m    BMI and BSA Data   Body Mass Index: 29.15 kg/m Body Surface Area: 1.78 m      Physical Exam  General appearance: alert, well developed, well nourished, cooperative and in no distress Head: Normocephalic, without obvious abnormality, atraumatic Respiratory: Respirations even and unlabored, normal respiratory rate Extremities: No gross deformities Skin: Skin color, texture, turgor normal. No rashes seen  Psych: Appropriate mood and affect. Neurologic: Mental status: Alert, oriented to person, place, and time, thought content appropriate.   Results for orders placed or performed in visit on 12/04/16  POCT glycosylated hemoglobin (Hb A1C)  Result Value Ref Range   Hemoglobin A1C 5.8    Est. average glucose Bld gHb Est-mCnc 120        Assessment & Plan:     1. Essential hypertension Well controlled.  Continue current medications.   - amLODipine (NORVASC) 2.5 MG tablet; Take 1 tablet (2.5 mg total) by mouth daily.  Dispense: 30 tablet; Refill: 11 - metoprolol succinate (TOPROL-XL) 50 MG 24 hr tablet; Take 1 tablet (50 mg total) by mouth daily. Take with or immediately following a meal.  Dispense: 30 tablet; Refill: 11  2. Hyperglycemia Stable check a1c in 6 months.  - POCT glycosylated hemoglobin (Hb A1C)  3. Colon cancer screening Cologuard done in 11/2015  4. Osteoporosis, unspecified osteoporosis type, unspecified pathological  fracture presence Doing well on alendronate. Consider drug holiday after November 2019 - alendronate (FOSAMAX) 70 MG tablet; TAKE ONE TABLET EVERY WEEK AS DIRECTED  Dispense: 12 tablet; Refill: 4  5. Hyperlipidemia, unspecified hyperlipidemia type She is tolerating lovastatin well with no adverse effects.   - lovastatin (MEVACOR) 40 MG tablet; Take 1 tablet (40 mg total) by mouth at bedtime.  Dispense: 30 tablet; Refill: [place], MD  [facility]

## 2016-12-04 NOTE — Patient Instructions (Addendum)
Anna Johnson , Thank you for taking time to come for your Medicare Wellness Visit. I appreciate your ongoing commitment to your health goals. Please review the following plan we discussed and let me know if I can assist you in the future.   Screening recommendations/referrals: Colonoscopy: completed 09/16/05 Mammogram: completed 08/13/13, next scheduled 12/17/16 Bone Density: completed 07/11/16, due 06/2026 Recommended yearly ophthalmology/optometry visit for glaucoma screening and checkup Recommended yearly dental visit for hygiene and checkup  Vaccinations: Influenza vaccine: up to date, due 04/2017 Pneumococcal vaccine: series completed Tdap vaccine: completed 04/29/11, due 04/2021 Shingles vaccine: completed 04/13/08    Advanced directives: Please bring a copy of your POA (Power of [place]) and/or Living Will to your next appointment.   Conditions/risks identified: Increase exercise.  Next appointment: None, need to schedule 1 year AWV.   Preventive Care 67 Years and Older, Female Preventive care refers to lifestyle choices and visits with your health care provider that can promote health and wellness. What does preventive care include?  A yearly physical exam. This is also called an annual well check.  Dental exams once or twice a year.  Routine eye exams. Ask your health care provider how often you should have your eyes checked.  Personal lifestyle choices, including:  Daily care of your teeth and gums.  Regular physical activity.  Eating a healthy diet.  Avoiding tobacco and drug use.  Limiting alcohol use.  Practicing safe sex.  Taking low-dose aspirin every day.  Taking vitamin and mineral supplements as recommended by your health care provider. What happens during an annual well check? The services and screenings done by your health care provider during your annual well check will depend on your age, overall health, lifestyle risk factors, and family  history of disease. Counseling  Your health care provider may ask you questions about your:  Alcohol use.  Tobacco use.  Drug use.  Emotional well-being.  Home and relationship well-being.  Sexual activity.  Eating habits.  History of falls.  Memory and ability to understand (cognition).  Work and work Statistician.  Reproductive health. Screening  You may have the following tests or measurements:  Height, weight, and BMI.  Blood pressure.  Lipid and cholesterol levels. These may be checked every 5 years, or more frequently if you are over 56 years old.  Skin check.  Lung cancer screening. You may have this screening every year starting at age 11 if you have a 30-pack-year history of smoking and currently smoke or have quit within the past 15 years.  Fecal occult blood test (FOBT) of the stool. You may have this test every year starting at age 63.  Flexible sigmoidoscopy or colonoscopy. You may have a sigmoidoscopy every 5 years or a colonoscopy every 10 years starting at age 45.  Hepatitis C blood test.  Hepatitis B blood test.  Sexually transmitted disease (STD) testing.  Diabetes screening. This is done by checking your blood sugar (glucose) after you have not eaten for a while (fasting). You may have this done every 1-3 years.  Bone density scan. This is done to screen for osteoporosis. You may have this done starting at age 55.  Mammogram. This may be done every 1-2 years. Talk to your health care provider about how often you should have regular mammograms. Talk with your health care provider about your test results, treatment options, and if necessary, the need for more tests. Vaccines  Your health care provider may recommend certain vaccines, such as:  Influenza  vaccine. This is recommended every year.  Tetanus, diphtheria, and acellular pertussis (Tdap, Td) vaccine. You may need a Td booster every 10 years.  Zoster vaccine. You may need this after  age 50.  Pneumococcal 13-valent conjugate (PCV13) vaccine. One dose is recommended after age 36.  Pneumococcal polysaccharide (PPSV23) vaccine. One dose is recommended after age 87. Talk to your health care provider about which screenings and vaccines you need and how often you need them. This information is not intended to replace advice given to you by your health care provider. Make sure you discuss any questions you have with your health care provider. Document Released: 08/25/2015 Document Revised: 04/17/2016 Document Reviewed: 05/30/2015 Elsevier Interactive Patient Education  2017 [place] Prevention in the Home Falls can cause injuries. They can happen to people of all ages. There are many things you can do to make your home safe and to help prevent falls. What can I do on the outside of my home?  Regularly fix the edges of walkways and driveways and fix any cracks.  Remove anything that might make you trip as you walk through a door, such as a raised step or threshold.  Trim any bushes or trees on the path to your home.  Use bright outdoor lighting.  Clear any walking paths of anything that might make someone trip, such as rocks or tools.  Regularly check to see if handrails are loose or broken. Make sure that both sides of any steps have handrails.  Any raised decks and porches should have guardrails on the edges.  Have any leaves, snow, or ice cleared regularly.  Use sand or salt on walking paths during winter.  Clean up any spills in your garage right away. This includes oil or grease spills. What can I do in the bathroom?  Use night lights.  Install grab bars by the toilet and in the tub and shower. Do not use towel bars as grab bars.  Use non-skid mats or decals in the tub or shower.  If you need to sit down in the shower, use a plastic, non-slip stool.  Keep the floor dry. Clean up any water that spills on the floor as soon as it  happens.  Remove soap buildup in the tub or shower regularly.  Attach bath mats securely with double-sided non-slip rug tape.  Do not have throw rugs and other things on the floor that can make you trip. What can I do in the bedroom?  Use night lights.  Make sure that you have a light by your bed that is easy to reach.  Do not use any sheets or blankets that are too big for your bed. They should not hang down onto the floor.  Have a firm chair that has side arms. You can use this for support while you get dressed.  Do not have throw rugs and other things on the floor that can make you trip. What can I do in the kitchen?  Clean up any spills right away.  Avoid walking on wet floors.  Keep items that you use a lot in easy-to-reach places.  If you need to reach something above you, use a strong step stool that has a grab bar.  Keep electrical cords out of the way.  Do not use floor polish or wax that makes floors slippery. If you must use wax, use non-skid floor wax.  Do not have throw rugs and other things on the floor that can  make you trip. What can I do with my stairs?  Do not leave any items on the stairs.  Make sure that there are handrails on both sides of the stairs and use them. Fix handrails that are broken or loose. Make sure that handrails are as long as the stairways.  Check any carpeting to make sure that it is firmly attached to the stairs. Fix any carpet that is loose or worn.  Avoid having throw rugs at the top or bottom of the stairs. If you do have throw rugs, attach them to the floor with carpet tape.  Make sure that you have a light switch at the top of the stairs and the bottom of the stairs. If you do not have them, ask someone to add them for you. What else can I do to help prevent falls?  Wear shoes that:  Do not have high heels.  Have rubber bottoms.  Are comfortable and fit you well.  Are closed at the toe. Do not wear sandals.  If you  use a stepladder:  Make sure that it is fully opened. Do not climb a closed stepladder.  Make sure that both sides of the stepladder are locked into place.  Ask someone to hold it for you, if possible.  Clearly mark and make sure that you can see:  Any grab bars or handrails.  First and last steps.  Where the edge of each step is.  Use tools that help you move around (mobility aids) if they are needed. These include:  Canes.  Walkers.  Scooters.  Crutches.  Turn on the lights when you go into a dark area. Replace any light bulbs as soon as they burn out.  Set up your furniture so you have a clear path. Avoid moving your furniture around.  If any of your floors are uneven, fix them.  If there are any pets around you, be aware of where they are.  Review your medicines with your doctor. Some medicines can make you feel dizzy. This can increase your chance of falling. Ask your doctor what other things that you can do to help prevent falls. This information is not intended to replace advice given to you by your health care provider. Make sure you discuss any questions you have with your health care provider. Document Released: 05/25/2009 Document Revised: 01/04/2016 Document Reviewed: 09/02/2014 Elsevier Interactive Patient Education  2017 [employer].

## 2016-12-04 NOTE — Progress Notes (Signed)
Subjective:   Anna Johnson is a 76 y.o. female who presents for Medicare Annual (Subsequent) preventive examination.  Review of Systems:  N/A Cardiac Risk Factors include: advanced age (>57men, >70 women);dyslipidemia;hypertension;sedentary lifestyle     Objective:     Vitals: BP 140/78 (BP Location: Right Arm)   Pulse 72   Temp 97.6 F (36.4 C) (Oral)   Ht 5\' 2"  (1.575 m)   Wt 159 lb 6.4 oz (72.3 kg)   BMI 29.15 kg/m   Body mass index is 29.15 kg/m.   Tobacco History  Smoking Status  . Never Smoker  Smokeless Tobacco  . Never Used     Counseling given: Not Answered   Past Medical History:  Diagnosis Date  . Allergy   . Glaucoma   . Hyperlipidemia   . Hypertension   . Osteoporosis    Past Surgical History:  Procedure Laterality Date  . CATARACT EXTRACTION  2004  . DILATION AND CURETTAGE OF UTERUS    . HIP FRACTURE SURGERY  09/15/2010   left hip  . WRIST FRACTURE SURGERY     ORIF   Family History  Problem Relation Age of Onset  . Uterine cancer Mother   . Heart attack Father   . Healthy Brother   . Cancer Son     colon, died from sx   History  Sexual Activity  . Sexual activity: Not on file    Outpatient Encounter Prescriptions as of 12/04/2016  Medication Sig  . Acetaminophen (ACETAMINOPHEN EXTRA STRENGTH) 500 MG coapsule Take 2 capsules by mouth as needed for fever.  Marland Kitchen alendronate (FOSAMAX) 70 MG tablet TAKE ONE TABLET EVERY WEEK AS DIRECTED  . amLODipine (NORVASC) 2.5 MG tablet Take 1 tablet (2.5 mg total) by mouth daily.  Marland Kitchen aspirin 81 MG tablet Take 81 mg by mouth daily.  . cholecalciferol (VITAMIN D) 1000 UNITS tablet Take 2,000 Units by mouth daily.   . Coenzyme Q10 (CO Q-10) 200 MG CAPS Take 200 mg by mouth daily.  . fluticasone (FLONASE) 50 MCG/ACT nasal spray Place 2 sprays into both nostrils daily. Reported on 11/23/2015 (Patient taking differently: Place 2 sprays into both nostrils daily. Reported on 11/23/2015)  . hydrochlorothiazide  (HYDRODIURIL) 12.5 MG tablet TAKE ONE (1) TABLET EACH DAY  . latanoprost (XALATAN) 0.005 % ophthalmic solution Place 1 drop into both eyes at bedtime.  Marland Kitchen loratadine (CLARITIN) 10 MG tablet Take 10 mg by mouth daily as needed for allergies.  Marland Kitchen lovastatin (MEVACOR) 40 MG tablet Take 1 tablet (40 mg total) by mouth at bedtime.  . metoprolol succinate (TOPROL-XL) 50 MG 24 hr tablet Take 1 tablet (50 mg total) by mouth daily. Take with or immediately following a meal.  . quinapril (ACCUPRIL) 40 MG tablet Take 1 tablet (40 mg total) by mouth at bedtime.  . timolol (TIMOPTIC) 0.5 % ophthalmic solution Place 1 drop into both eyes daily.   Marland Kitchen co-enzyme Q-10 50 MG capsule Take 50 mg by mouth daily.  . meloxicam (MOBIC) 15 MG tablet Take 1 tablet (15 mg total) by mouth daily. Reported on 11/23/2015 (Patient not taking: Reported on 12/04/2016)   No facility-administered encounter medications on file as of 12/04/2016.     Activities of Daily Living In your present state of health, do you have any difficulty performing the following activities: 12/04/2016  Hearing? N  Vision? N  Difficulty concentrating or making decisions? N  Walking or climbing stairs? N  Dressing or bathing? N  Doing errands,  shopping? N  Preparing Food and eating ? N  Using the Toilet? N  In the past six months, have you accidently leaked urine? N  Do you have problems with loss of bowel control? N  Managing your Medications? N  Managing your Finances? N  Housekeeping or managing your Housekeeping? N  Some recent data might be hidden    Patient Care Team: Birdie Sons, MD as PCP - General (Family Medicine)    Assessment:     Exercise Activities and Dietary recommendations Current Exercise Habits: The patient does not participate in regular exercise at present (stairs in home, lots of walking daily though not structured), Exercise limited by: None identified  Goals    . Exercise          Recommend increasing  exercise/activity. Pt to start walking 2 days a week for 45 minutes.       Fall Risk Fall Risk  12/04/2016 11/23/2015  Falls in the past year? No No   Depression Screen PHQ 2/9 Scores 12/04/2016 11/23/2015  PHQ - 2 Score 0 0     Cognitive Function        Immunization History  Administered Date(s) Administered  . Influenza, High Dose Seasonal PF 05/08/2015, 05/30/2016  . Pneumococcal Conjugate-13 11/03/2013  . Pneumococcal Polysaccharide-23 06/04/2008  . Td 04/13/2008  . Tdap 04/29/2011  . Zoster 04/13/2008   Screening Tests Health Maintenance  Topic Date Due  . INFLUENZA VACCINE  03/12/2017  . DEXA SCAN  07/12/2019  . TETANUS/TDAP  04/28/2021  . PNA vac Low Risk Adult  Completed      Plan:  I have personally reviewed and addressed the Medicare Annual Wellness questionnaire and have noted the following in the patient's chart:  A. Medical and social history B. Use of alcohol, tobacco or illicit drugs  C. Current medications and supplements D. Functional ability and status E.  Nutritional status F.  Physical activity G. Advance directives H. List of other physicians I.  Hospitalizations, surgeries, and ER visits in previous 12 months J.  [place] such as hearing and vision if needed, cognitive and depression L. Referrals and appointments - none  In addition, I have reviewed and discussed with patient certain preventive protocols, quality metrics, and best practice recommendations. A written personalized care plan for preventive services as well as general preventive health recommendations were provided to patient.  See attached scanned questionnaire for additional information.   Signed,  Fabio Neighbors, LPN Nurse Health Advisor   MD Recommendations: None.  I have reviewed the health advisor's note, was available for consultation, and agree with documentation and plan  Lelon Huh, MD

## 2016-12-05 ENCOUNTER — Other Ambulatory Visit: Payer: Self-pay | Admitting: Family Medicine

## 2016-12-05 DIAGNOSIS — I10 Essential (primary) hypertension: Secondary | ICD-10-CM

## 2016-12-05 DIAGNOSIS — E785 Hyperlipidemia, unspecified: Secondary | ICD-10-CM

## 2016-12-11 ENCOUNTER — Ambulatory Visit: Payer: Medicare Other

## 2016-12-17 ENCOUNTER — Ambulatory Visit
Admission: RE | Admit: 2016-12-17 | Discharge: 2016-12-17 | Disposition: A | Discharge status: Home / Self Care | Payer: Medicare Other | Source: Ambulatory Visit | Attending: Obstetrics & Gynecology | Admitting: Obstetrics & Gynecology

## 2016-12-17 DIAGNOSIS — Z1231 Encounter for screening mammogram for malignant neoplasm of breast: Secondary | ICD-10-CM | POA: Diagnosis not present

## 2017-06-10 ENCOUNTER — Encounter: Payer: Self-pay | Admitting: Family Medicine

## 2017-06-10 ENCOUNTER — Ambulatory Visit (INDEPENDENT_AMBULATORY_CARE_PROVIDER_SITE_OTHER): Payer: Medicare Other | Admitting: Family Medicine

## 2017-06-10 VITALS — BP 126/70 | HR 64 | Temp 97.8°F | Resp 16 | Wt 161.0 lb

## 2017-06-10 DIAGNOSIS — R739 Hyperglycemia, unspecified: Secondary | ICD-10-CM | POA: Diagnosis not present

## 2017-06-10 DIAGNOSIS — M810 Age-related osteoporosis without current pathological fracture: Secondary | ICD-10-CM

## 2017-06-10 DIAGNOSIS — I10 Essential (primary) hypertension: Secondary | ICD-10-CM | POA: Diagnosis not present

## 2017-06-10 DIAGNOSIS — Z23 Encounter for immunization: Secondary | ICD-10-CM

## 2017-06-10 DIAGNOSIS — E559 Vitamin D deficiency, unspecified: Secondary | ICD-10-CM

## 2017-06-10 DIAGNOSIS — E785 Hyperlipidemia, unspecified: Secondary | ICD-10-CM

## 2017-06-10 NOTE — Progress Notes (Signed)
Patient: Anna Johnson Female    DOB: 1941/02/19   76 y.o.   MRN: [id number] Visit Date: 06/10/2017  Today's Provider: Lelon Huh, MD   Chief Complaint  Patient presents with  . Hypertension  . Hyperlipidemia  . Hyperglycemia   Subjective:    HPI   Hypertension, follow-up:  BP Readings from Last 3 Encounters:  06/10/17 126/70  12/04/16 140/78  05/30/16 120/68    She was last seen for hypertension 6 months ago.  BP at that visit was 140/78. Management since that visit includes; no changes.She reports good compliance with treatment. She is not having side effects.  She is exercising. She is adherent to low salt diet.   Outside blood pressures are checked occasionally. She is experiencing none.  Patient denies exertional chest pressure/discomfort, lower extremity edema and palpitations Cardiovascular risk factors include dyslipidemia.       Lipid/Cholesterol, Follow-up:   Last seen for this 1 years ago.  Management since that visit includes; labs checked, no changes.  Last Lipid Panel:    Component Value Date/Time   CHOL 165 05/31/2016 0824   TRIG 49 05/31/2016 0824   HDL 75 05/31/2016 0824   CHOLHDL 2.2 05/31/2016 0824   LDLCALC 80 05/31/2016 0824    She reports good compliance with treatment. She is not having side effects.   Wt Readings from Last 3 Encounters:  06/10/17 161 lb (73 kg)  12/04/16 159 lb 6.4 oz (72.3 kg)  05/30/16 160 lb (72.6 kg)     Hyperglycemia From 12/04/2016-Stable check a1c in 6 months. Hemoglobin A1C 5.8.   Allergies  Allergen Reactions  . Codeine   . Covera-Hs [Verapamil] Rash     Current Outpatient Prescriptions:  .  Acetaminophen (ACETAMINOPHEN EXTRA STRENGTH) 500 MG coapsule, Take 2 capsules by mouth as needed for fever., Disp: , Rfl:  .  alendronate (FOSAMAX) 70 MG tablet, TAKE ONE TABLET EVERY WEEK AS DIRECTED, Disp: 12 tablet, Rfl: 4 .  amLODipine (NORVASC) 2.5 MG tablet, TAKE ONE (1) TABLET EACH  DAY, Disp: 30 tablet, Rfl: 12 .  aspirin 81 MG tablet, Take 81 mg by mouth daily., Disp: , Rfl:  .  cholecalciferol (VITAMIN D) 1000 UNITS tablet, Take 2,000 Units by mouth daily. , Disp: , Rfl:  .  Coenzyme Q10 (CO Q-10) 200 MG CAPS, Take 200 mg by mouth daily., Disp: , Rfl:  .  fluticasone (FLONASE) 50 MCG/ACT nasal spray, Place 2 sprays into both nostrils daily. Reported on 11/23/2015 (Patient taking differently: Place 2 sprays into both nostrils daily. Reported on 11/23/2015), Disp: 16 g, Rfl: 11 .  hydrochlorothiazide (HYDRODIURIL) 12.5 MG tablet, TAKE ONE (1) TABLET EACH DAY, Disp: 90 tablet, Rfl: 4 .  latanoprost (XALATAN) 0.005 % ophthalmic solution, Place 1 drop into both eyes at bedtime., Disp: , Rfl:  .  loratadine (CLARITIN) 10 MG tablet, Take 10 mg by mouth daily as needed for allergies., Disp: , Rfl:  .  lovastatin (MEVACOR) 40 MG tablet, TAKE ONE (1) TABLET AT BEDTIME, Disp: 30 tablet, Rfl: 12 .  metoprolol succinate (TOPROL-XL) 50 MG 24 hr tablet, TAKE ONE TABLET DAILY WITH ANY MEAL OR IMMEDIATELY FOLLOWING A MEAL, Disp: 30 tablet, Rfl: 12 .  quinapril (ACCUPRIL) 40 MG tablet, Take 1 tablet (40 mg total) by mouth at bedtime., Disp: 30 tablet, Rfl: 11 .  timolol (TIMOPTIC) 0.5 % ophthalmic solution, Place 1 drop into both eyes daily. , Disp: , Rfl:  .  co-enzyme Q-10  50 MG capsule, Take 50 mg by mouth daily., Disp: , Rfl:  .  meloxicam (MOBIC) 15 MG tablet, Take 1 tablet (15 mg total) by mouth daily. Reported on 11/23/2015 (Patient not taking: Reported on 12/04/2016), Disp: 30 tablet, Rfl: 5  Review of Systems  Constitutional: Negative for appetite change, chills, fatigue and fever.  Respiratory: Negative for chest tightness and shortness of breath.   Cardiovascular: Negative for chest pain and palpitations.  Gastrointestinal: Negative for abdominal pain, nausea and vomiting.  Neurological: Negative for dizziness and weakness.    Social History  Substance Use Topics  . Smoking  status: Never Smoker  . Smokeless tobacco: Never Used  . Alcohol use 12.6 oz/week    7 Standard drinks or equivalent, 7 Glasses of wine, 7 Shots of liquor per week   Objective:   BP 126/70 (BP Location: Left Arm, Patient Position: Sitting, Cuff Size: Normal)   Pulse 64   Temp 97.8 F (36.6 C)   Resp 16   Wt 161 lb (73 kg)   BMI 29.45 kg/m     Physical Exam   General Appearance:    Alert, cooperative, no distress  Eyes:    PERRL, conjunctiva/corneas clear, EOM's intact       Lungs:     Clear to auscultation bilaterally, respirations unlabored  Heart:    Regular rate and rhythm  Neurologic:   Awake, alert, oriented x 3. No apparent focal neurological           defect.           Assessment & Plan:     1. Essential hypertension Well controlled.  Continue current medications.   - EKG 12-Lead  2. Osteoporosis without current pathological fracture, unspecified osteoporosis type  - VITAMIN D 25 Hydroxy (Vit-D Deficiency, Fractures)  3. Hyperglycemia  - Hemoglobin A1c  4. Hyperlipidemia, unspecified hyperlipidemia type She is tolerating lovastatin well with no adverse effects.   - CBC with Differential/Platelet - Comprehensive metabolic panel - Lipid panel  5. Vitamin D deficiency  - VITAMIN D 25 Hydroxy (Vit-D Deficiency, Fractures)  6. Influenza vaccine needed  - Flu vaccine HIGH DOSE PF (Fluzone High dose)       Lelon Huh, MD  [facility]

## 2017-06-12 LAB — COMPLETE METABOLIC PANEL WITH GFR
AG RATIO: 1.4 (calc) (ref 1.0–2.5)
ALKALINE PHOSPHATASE (APISO): 34 U/L (ref 33–130)
ALT: 13 U/L (ref 6–29)
AST: 17 U/L (ref 10–35)
Albumin: 4 g/dL (ref 3.6–5.1)
BILIRUBIN TOTAL: 0.5 mg/dL (ref 0.2–1.2)
BUN: 14 mg/dL (ref 7–25)
CHLORIDE: 101 mmol/L (ref 98–110)
CO2: 29 mmol/L (ref 20–32)
Calcium: 9.2 mg/dL (ref 8.6–10.4)
Creat: 0.76 mg/dL (ref 0.60–0.93)
GFR, Est African American: 88 mL/min/{1.73_m2} (ref >=60)
GFR, Est Non African American: 76 mL/min/{1.73_m2} (ref >=60)
GLOBULIN: 2.8 g/dL (ref 1.9–3.7)
Glucose, Bld: 99 mg/dL (ref 65–99)
POTASSIUM: 4.1 mmol/L (ref 3.5–5.3)
SODIUM: 137 mmol/L (ref 135–146)
Total Protein: 6.8 g/dL (ref 6.1–8.1)

## 2017-06-12 LAB — CBC WITH DIFFERENTIAL/PLATELET
BASOS ABS: 72 {cells}/uL (ref 0–200)
Basophils Relative: 1.3 %
EOS PCT: 3.7 %
Eosinophils Absolute: 204 cells/uL (ref 15–500)
HEMATOCRIT: 40.5 % (ref 35.0–45.0)
HEMOGLOBIN: 13.9 g/dL (ref 11.7–15.5)
LYMPHS ABS: 1359 {cells}/uL (ref 850–3900)
MCH: 32.3 pg (ref 27.0–33.0)
MCHC: 34.3 g/dL (ref 32.0–36.0)
MCV: 94 fL (ref 80.0–100.0)
MPV: 10.4 fL (ref 7.5–12.5)
Monocytes Relative: 12.2 %
NEUTROS ABS: 3196 {cells}/uL (ref 1500–7800)
Neutrophils Relative %: 58.1 %
Platelets: 246 10*3/uL (ref 140–400)
RBC: 4.31 10*6/uL (ref 3.80–5.10)
RDW: 11.9 % (ref 11.0–15.0)
Total Lymphocyte: 24.7 %
WBC mixed population: 671 cells/uL (ref 200–950)
WBC: 5.5 10*3/uL (ref 3.8–10.8)

## 2017-06-12 LAB — HEMOGLOBIN A1C
Hgb A1c MFr Bld: 5.5 % of total Hgb (ref <5.7)
MEAN PLASMA GLUCOSE: 111 (calc)
eAG (mmol/L): 6.2 (calc)

## 2017-06-12 LAB — LIPID PANEL
CHOL/HDL RATIO: 2.1 (calc) (ref <5.0)
Cholesterol: 187 mg/dL (ref <200)
HDL: 89 mg/dL (ref >50)
LDL Cholesterol (Calc): 85 mg/dL (calc)
NON-HDL CHOLESTEROL (CALC): 98 mg/dL (ref <130)
TRIGLYCERIDES: 54 mg/dL (ref <150)

## 2017-06-12 LAB — VITAMIN D 25 HYDROXY (VIT D DEFICIENCY, FRACTURES): VIT D 25 HYDROXY: 55 ng/mL (ref 30–100)

## 2017-07-17 ENCOUNTER — Encounter: Payer: Self-pay | Admitting: Podiatry

## 2017-07-17 ENCOUNTER — Ambulatory Visit: Payer: Medicare Other | Admitting: Podiatry

## 2017-07-17 VITALS — BP 139/75 | HR 66

## 2017-07-17 DIAGNOSIS — L02612 Cutaneous abscess of left foot: Secondary | ICD-10-CM | POA: Diagnosis not present

## 2017-07-17 DIAGNOSIS — L03032 Cellulitis of left toe: Secondary | ICD-10-CM | POA: Diagnosis not present

## 2017-07-17 MED ORDER — CEPHALEXIN 500 MG PO CAPS: 500.0000 mg | ORAL_CAPSULE | Freq: Two times a day (BID) | ORAL | 0 refills | Status: DC

## 2017-07-17 NOTE — Progress Notes (Signed)
   Subjective:    Patient ID: Anna Johnson, female    DOB: 1941/06/07, 76 y.o.   MRN: [id number]  HPIthis patient presents the office with chief complaint of a painful tip of her third toe, left foot.  She says this is been painful for approximately 3 weeks and believes it started at Thanksgiving.  She says that she has provided no self treatment nor sought any professional help.  Patient is not a diabetic.  Patient says the corn on the tip of the third toe, left foot is painful walking and wearing her shoes.  She presents the office today for an evaluation and treatment of this painful corn on the tip of her third toe, left foot    Review of Systems  All other systems reviewed and are negative.      Objective:   Physical Exam General Appearance  Alert, conversant and in no acute stress.  Vascular  Dorsalis pedis and posterior pulses are palpable  bilaterally.  Capillary return is within normal limits  Bilaterally. Temperature is within normal limits  Bilaterally  Neurologic  Senn-Weinstein monofilament wire test within normal limits  bilaterally. Muscle power  Within normal limits bilaterally.  Nails Thick disfigured discolored nails with subungual debris bilaterally from hallux to fifth toes bilaterally. No evidence of bacterial infection or drainage bilaterally.  Orthopedic  No limitations of motion of motion feet bilaterally.  No crepitus or effusions noted.  Contracted digits 2 through 4 bilaterally.  Skin  normotropic skin with no porokeratosis noted bilaterally.  Clavi noted third toe left foot   Infected clavi 3rd toe left foot.   Initial exam.  Upon debridement of the clavi third toe, left foot. It was noted there was pus noted under the clavi.  This pus was evacuated and bandaged with Neosporin and dry sterile dressing.  Home instructions was given to this patient for soaking.  Prescribe cephalexin. 500 mg 1 twice a day.  Return to clinic when necessary   Gardiner Barefoot  DPM        Assessment & Plan:

## 2017-07-28 ENCOUNTER — Other Ambulatory Visit: Payer: Self-pay | Admitting: *Deleted

## 2017-07-28 DIAGNOSIS — I10 Essential (primary) hypertension: Secondary | ICD-10-CM

## 2017-07-28 MED ORDER — HYDROCHLOROTHIAZIDE 12.5 MG PO TABS
ORAL_TABLET | ORAL | 4 refills | Status: DC
Start: 2017-07-28 — End: 2017-12-01

## 2017-10-02 ENCOUNTER — Encounter: Payer: Self-pay | Admitting: Podiatry

## 2017-10-02 ENCOUNTER — Ambulatory Visit: Payer: Medicare Other | Admitting: Podiatry

## 2017-10-02 DIAGNOSIS — M79674 Pain in right toe(s): Secondary | ICD-10-CM

## 2017-10-02 DIAGNOSIS — M79675 Pain in left toe(s): Secondary | ICD-10-CM | POA: Diagnosis not present

## 2017-10-02 DIAGNOSIS — L84 Corns and callosities: Secondary | ICD-10-CM | POA: Diagnosis not present

## 2017-10-02 DIAGNOSIS — B351 Tinea unguium: Secondary | ICD-10-CM | POA: Diagnosis not present

## 2017-10-02 NOTE — Progress Notes (Signed)
Complaint:  Visit Type: Patient returns to my office for treatment of her long thick painful nails.  She says her nails are painful walking and wearing her shoes. She says that she is unable to self treat and during her last visit  for an infected clavi  I recommended she come in to have her  nails professionally treated.  Podiatric Exam: Vascular: dorsalis pedis and posterior tibial pulses are palpable bilateral. Capillary return is immediate. Temperature gradient is WNL. Skin turgor WNL  Sensorium: Normal Semmes Weinstein monofilament test. Normal tactile sensation bilaterally. Nail Exam: Pt has thick disfigured discolored nails with subungual debris noted bilateral entire nail hallux through fifth toenails Ulcer Exam: There is no evidence of ulcer or pre-ulcerative changes or infection. Orthopedic Exam: Muscle tone and strength are WNL. No limitations in general ROM. No crepitus or effusions noted. Foot type and digits show no abnormalities. Bony prominences are unremarkable. Skin: No Porokeratosis. No infection or ulcers.  Distal clavi 3rd toe left foot.  Diagnosis:  Onychomycosis, , Pain in right toe, pain in left toes Clavi left foot  Treatment & Plan Procedures and Treatment: Consent by patient was obtained for treatment procedures.   Debridement of mycotic and hypertrophic toenails, 1 through 5 bilateral and clearing of subungual debris. No ulceration, no infection noted. Debride clavi. Return Visit-Office Procedure: Patient instructed to return to the office for a follow up visit 3 months for continued evaluation and treatment.    Gardiner Barefoot DPM

## 2017-11-13 NOTE — Telephone Encounter (Signed)
This encounter was created in error - please disregard.

## 2017-12-01 ENCOUNTER — Other Ambulatory Visit: Payer: Self-pay | Admitting: Family Medicine

## 2017-12-01 ENCOUNTER — Ambulatory Visit: Payer: Self-pay | Admitting: Family Medicine

## 2017-12-01 DIAGNOSIS — I10 Essential (primary) hypertension: Secondary | ICD-10-CM

## 2017-12-01 DIAGNOSIS — E785 Hyperlipidemia, unspecified: Secondary | ICD-10-CM

## 2017-12-01 MED ORDER — LOVASTATIN 40 MG PO TABS: ORAL_TABLET | ORAL | 12 refills | Status: DC

## 2017-12-01 MED ORDER — QUINAPRIL HCL 40 MG PO TABS: 40.0000 mg | ORAL_TABLET | Freq: Every day | ORAL | 11 refills | Status: DC

## 2017-12-01 MED ORDER — METOPROLOL SUCCINATE ER 50 MG PO TB24: ORAL_TABLET | ORAL | 12 refills | Status: DC

## 2017-12-01 MED ORDER — AMLODIPINE BESYLATE 2.5 MG PO TABS: ORAL_TABLET | ORAL | 12 refills | Status: DC

## 2017-12-01 MED ORDER — HYDROCHLOROTHIAZIDE 12.5 MG PO TABS: ORAL_TABLET | ORAL | 4 refills | Status: DC

## 2017-12-01 NOTE — Telephone Encounter (Signed)
Pt needs refill on hr blood pressure meds  Lovastin 40mg   hydrochlorothiazide 12.5  Quinapril 40 mg  Metoprolol 50mg   Amlodipine 2.5 mg  She uses Anna Johnson  Her call back is [phone] if you need to call her back..  Thanks teri

## 2017-12-09 ENCOUNTER — Ambulatory Visit: Payer: Self-pay | Admitting: Family Medicine

## 2017-12-11 ENCOUNTER — Other Ambulatory Visit: Payer: Self-pay | Admitting: Family Medicine

## 2017-12-11 DIAGNOSIS — M810 Age-related osteoporosis without current pathological fracture: Secondary | ICD-10-CM

## 2017-12-18 ENCOUNTER — Encounter: Payer: Self-pay | Admitting: Podiatry

## 2017-12-18 ENCOUNTER — Ambulatory Visit: Payer: Medicare Other | Admitting: Podiatry

## 2017-12-18 DIAGNOSIS — M79675 Pain in left toe(s): Secondary | ICD-10-CM | POA: Diagnosis not present

## 2017-12-18 DIAGNOSIS — M79674 Pain in right toe(s): Secondary | ICD-10-CM | POA: Diagnosis not present

## 2017-12-18 DIAGNOSIS — L84 Corns and callosities: Secondary | ICD-10-CM | POA: Diagnosis not present

## 2017-12-18 DIAGNOSIS — B351 Tinea unguium: Secondary | ICD-10-CM | POA: Diagnosis not present

## 2017-12-18 NOTE — Progress Notes (Signed)
Complaint:  Visit Type: Patient returns to my office for treatment of her long thick painful nails.  She says her nails are painful walking and wearing her shoes. She says that she is unable to self treat and during her last visit  for an infected clavi  I recommended she come in to have her  nails professionally treated.  Podiatric Exam: Vascular: dorsalis pedis and posterior tibial pulses are palpable bilateral. Capillary return is immediate. Temperature gradient is WNL. Skin turgor WNL  Sensorium: Normal Semmes Weinstein monofilament test. Normal tactile sensation bilaterally. Nail Exam: Pt has thick disfigured discolored nails with subungual debris noted bilateral entire nail hallux through fifth toenails Ulcer Exam: There is no evidence of ulcer or pre-ulcerative changes or infection. Orthopedic Exam: Muscle tone and strength are WNL. No limitations in general ROM. No crepitus or effusions noted. Foot type and digits show no abnormalities. Bony prominences are unremarkable. Skin: No Porokeratosis. No infection or ulcers.  Distal clavi 3rd toe left foot.  Diagnosis:  Onychomycosis, , Pain in right toe, pain in left toes Clavi left foot  Treatment & Plan Procedures and Treatment: Consent by patient was obtained for treatment procedures.   Debridement of mycotic and hypertrophic toenails, 1 through 5 bilateral and clearing of subungual debris. No ulceration, no infection noted. Debride clavi. Return Visit-Office Procedure: Patient instructed to return to the office for a follow up visit 3 months for continued evaluation and treatment.    Gardiner Barefoot DPM

## 2017-12-24 ENCOUNTER — Ambulatory Visit (INDEPENDENT_AMBULATORY_CARE_PROVIDER_SITE_OTHER): Payer: Medicare Other | Admitting: Family Medicine

## 2017-12-24 ENCOUNTER — Ambulatory Visit (INDEPENDENT_AMBULATORY_CARE_PROVIDER_SITE_OTHER): Payer: Medicare Other

## 2017-12-24 VITALS — BP 140/72 | HR 72 | Temp 98.1°F | Ht 62.0 in | Wt 158.8 lb

## 2017-12-24 DIAGNOSIS — Z0000 Encounter for general adult medical examination without abnormal findings: Secondary | ICD-10-CM

## 2017-12-24 DIAGNOSIS — M810 Age-related osteoporosis without current pathological fracture: Secondary | ICD-10-CM | POA: Diagnosis not present

## 2017-12-24 DIAGNOSIS — I10 Essential (primary) hypertension: Secondary | ICD-10-CM

## 2017-12-24 DIAGNOSIS — Z6829 Body mass index (BMI) 29.0-29.9, adult: Secondary | ICD-10-CM | POA: Diagnosis not present

## 2017-12-24 NOTE — Patient Instructions (Signed)
   The CDC recommends two doses of Shingrix (the shingles vaccine) separated by 2 to 6 months for adults age 78 years and older. I recommend checking with your insurance plan regarding coverage for this vaccine.   

## 2017-12-24 NOTE — Patient Instructions (Addendum)
Anna Johnson , Thank you for taking time to come for your Medicare Wellness Visit. I appreciate your ongoing commitment to your health goals. Please review the following plan we discussed and let me know if I can assist you in the future.   Screening recommendations/referrals: Colonoscopy: Up to date Mammogram: Up to date Bone Density: Up to date Recommended yearly ophthalmology/optometry visit for glaucoma screening and checkup Recommended yearly dental visit for hygiene and checkup  Vaccinations: Influenza vaccine: Up to date Pneumococcal vaccine: Up to date Tdap vaccine: Up to date Shingles vaccine: Pt declines today.     Advanced directives: Please bring a copy of your POA (Power of Attorney) and/or Living Will to your next appointment.   Conditions/risks identified: Recommend increasing water intake to 4-6 glasses a day.   Next appointment: 10:00 AM today with Dr Caryn Section. Pt declined scheduling the AWV for 2020.    Preventive Care 39 Years and Older, Female Preventive care refers to lifestyle choices and visits with your health care provider that can promote health and wellness. What does preventive care include?  A yearly physical exam. This is also called an annual well check.  Dental exams once or twice a year.  Routine eye exams. Ask your health care provider how often you should have your eyes checked.  Personal lifestyle choices, including:  Daily care of your teeth and gums.  Regular physical activity.  Eating a healthy diet.  Avoiding tobacco and drug use.  Limiting alcohol use.  Practicing safe sex.  Taking low-dose aspirin every day.  Taking vitamin and mineral supplements as recommended by your health care provider. What happens during an annual well check? The services and screenings done by your health care provider during your annual well check will depend on your age, overall health, lifestyle risk factors, and family history of  disease. Counseling  Your health care provider may ask you questions about your:  Alcohol use.  Tobacco use.  Drug use.  Emotional well-being.  Home and relationship well-being.  Sexual activity.  Eating habits.  History of falls.  Memory and ability to understand (cognition).  Work and work Statistician.  Reproductive health. Screening  You may have the following tests or measurements:  Height, weight, and BMI.  Blood pressure.  Lipid and cholesterol levels. These may be checked every 5 years, or more frequently if you are over 45 years old.  Skin check.  Lung cancer screening. You may have this screening every year starting at age 51 if you have a 30-pack-year history of smoking and currently smoke or have quit within the past 15 years.  Fecal occult blood test (FOBT) of the stool. You may have this test every year starting at age 40.  Flexible sigmoidoscopy or colonoscopy. You may have a sigmoidoscopy every 5 years or a colonoscopy every 10 years starting at age 83.  Hepatitis C blood test.  Hepatitis B blood test.  Sexually transmitted disease (STD) testing.  Diabetes screening. This is done by checking your blood sugar (glucose) after you have not eaten for a while (fasting). You may have this done every 1-3 years.  Bone density scan. This is done to screen for osteoporosis. You may have this done starting at age 48.  Mammogram. This may be done every 1-2 years. Talk to your health care provider about how often you should have regular mammograms. Talk with your health care provider about your test results, treatment options, and if necessary, the need for more tests. Vaccines  Your health care provider may recommend certain vaccines, such as:  Influenza vaccine. This is recommended every year.  Tetanus, diphtheria, and acellular pertussis (Tdap, Td) vaccine. You may need a Td booster every 10 years.  Zoster vaccine. You may need this after age  74.  Pneumococcal 13-valent conjugate (PCV13) vaccine. One dose is recommended after age 19.  Pneumococcal polysaccharide (PPSV23) vaccine. One dose is recommended after age 2. Talk to your health care provider about which screenings and vaccines you need and how often you need them. This information is not intended to replace advice given to you by your health care provider. Make sure you discuss any questions you have with your health care provider. Document Released: 08/25/2015 Document Revised: 04/17/2016 Document Reviewed: 05/30/2015 Elsevier Interactive Patient Education  2017 [place] Prevention in the Home Falls can cause injuries. They can happen to people of all ages. There are many things you can do to make your home safe and to help prevent falls. What can I do on the outside of my home?  Regularly fix the edges of walkways and driveways and fix any cracks.  Remove anything that might make you trip as you walk through a door, such as a raised step or threshold.  Trim any bushes or trees on the path to your home.  Use bright outdoor lighting.  Clear any walking paths of anything that might make someone trip, such as rocks or tools.  Regularly check to see if handrails are loose or broken. Make sure that both sides of any steps have handrails.  Any raised decks and porches should have guardrails on the edges.  Have any leaves, snow, or ice cleared regularly.  Use sand or salt on walking paths during winter.  Clean up any spills in your garage right away. This includes oil or grease spills. What can I do in the bathroom?  Use night lights.  Install grab bars by the toilet and in the tub and shower. Do not use towel bars as grab bars.  Use non-skid mats or decals in the tub or shower.  If you need to sit down in the shower, use a plastic, non-slip stool.  Keep the floor dry. Clean up any water that spills on the floor as soon as it happens.  Remove  soap buildup in the tub or shower regularly.  Attach bath mats securely with double-sided non-slip rug tape.  Do not have throw rugs and other things on the floor that can make you trip. What can I do in the bedroom?  Use night lights.  Make sure that you have a light by your bed that is easy to reach.  Do not use any sheets or blankets that are too big for your bed. They should not hang down onto the floor.  Have a firm chair that has side arms. You can use this for support while you get dressed.  Do not have throw rugs and other things on the floor that can make you trip. What can I do in the kitchen?  Clean up any spills right away.  Avoid walking on wet floors.  Keep items that you use a lot in easy-to-reach places.  If you need to reach something above you, use a strong step stool that has a grab bar.  Keep electrical cords out of the way.  Do not use floor polish or wax that makes floors slippery. If you must use wax, use non-skid floor wax.  Do  not have throw rugs and other things on the floor that can make you trip. What can I do with my stairs?  Do not leave any items on the stairs.  Make sure that there are handrails on both sides of the stairs and use them. Fix handrails that are broken or loose. Make sure that handrails are as long as the stairways.  Check any carpeting to make sure that it is firmly attached to the stairs. Fix any carpet that is loose or worn.  Avoid having throw rugs at the top or bottom of the stairs. If you do have throw rugs, attach them to the floor with carpet tape.  Make sure that you have a light switch at the top of the stairs and the bottom of the stairs. If you do not have them, ask someone to add them for you. What else can I do to help prevent falls?  Wear shoes that:  Do not have high heels.  Have rubber bottoms.  Are comfortable and fit you well.  Are closed at the toe. Do not wear sandals.  If you use a  stepladder:  Make sure that it is fully opened. Do not climb a closed stepladder.  Make sure that both sides of the stepladder are locked into place.  Ask someone to hold it for you, if possible.  Clearly mark and make sure that you can see:  Any grab bars or handrails.  First and last steps.  Where the edge of each step is.  Use tools that help you move around (mobility aids) if they are needed. These include:  Canes.  Walkers.  Scooters.  Crutches.  Turn on the lights when you go into a dark area. Replace any light bulbs as soon as they burn out.  Set up your furniture so you have a clear path. Avoid moving your furniture around.  If any of your floors are uneven, fix them.  If there are any pets around you, be aware of where they are.  Review your medicines with your doctor. Some medicines can make you feel dizzy. This can increase your chance of falling. Ask your doctor what other things that you can do to help prevent falls. This information is not intended to replace advice given to you by your health care provider. Make sure you discuss any questions you have with your health care provider. Document Released: 05/25/2009 Document Revised: 01/04/2016 Document Reviewed: 09/02/2014 Elsevier Interactive Patient Education  2017 [employer].

## 2017-12-24 NOTE — Progress Notes (Signed)
Patient: Anna Johnson, Female    DOB: 1941/03/11, 77 y.o.   MRN: [id number] Visit Date: 12/24/2017  Today's Provider: Lelon Huh, MD   Chief Complaint  Patient presents with  . Annual Exam   Subjective:   Patient saw McKenzie for AWV today at 9:20.   Complete Physical Anna Johnson is a 77 y.o. female. She feels well. She reports exercising none. She reports she is sleeping well.  -----------------------------------------------------------   Hypertension, follow-up:  BP Readings from Last 3 Encounters:  12/24/17 140/72  07/17/17 139/75  06/10/17 126/70    She was last seen for hypertension 7 months ago.  BP at that visit was 126/70. Management since that visit includes; no changes.She reports good compliance with treatment. She is not having side effects. none She is not exercising. She is not adherent to low salt diet.   Outside blood pressures are not checking. She is experiencing none.  Patient denies none.   Cardiovascular risk factors include advanced age (older than 2 for men, 72 for women).  Use of agents associated with hypertension: none.   ------------------------------------------------------------    Lipid/Cholesterol, Follow-up:   Last seen for this 7 months ago.  Management since that visit includes; labs checked, no changes.  Last Lipid Panel:    Component Value Date/Time   CHOL 187 06/11/2017 0832   CHOL 165 05/31/2016 0824   TRIG 54 06/11/2017 0832   HDL 89 06/11/2017 0832   HDL 75 05/31/2016 0824   CHOLHDL 2.1 06/11/2017 0832   LDLCALC 85 06/11/2017 0832    She reports good compliance with treatment. She is not having side effects. none  Wt Readings from Last 3 Encounters:  12/24/17 158 lb 12.8 oz (72 kg)  06/10/17 161 lb (73 kg)  12/04/16 159 lb 6.4 oz (72.3 kg)    ------------------------------------------------------------  Osteoporosis without current pathological fracture, unspecified osteoporosis  type From 06/10/2017-labs checked, no changes.   Hyperglycemia From 06/10/2017-labs checked, no changes. Hemoglobin A1c 5.5.  Vitamin D deficiency From 06/10/2017-labs checked, no changes.     Review of Systems  Constitutional: Negative for chills, diaphoresis and fever.  HENT: Positive for dental problem. Negative for congestion, ear discharge, ear pain, hearing loss, nosebleeds, sore throat and tinnitus.   Eyes: Negative for photophobia, pain, discharge and redness.  Respiratory: Negative for cough, shortness of breath, wheezing and stridor.   Cardiovascular: Negative for chest pain, palpitations and leg swelling.  Gastrointestinal: Negative for abdominal pain, blood in stool, constipation, diarrhea, nausea and vomiting.  Endocrine: Negative for polydipsia.  Genitourinary: Negative for dysuria, flank pain, frequency, hematuria and urgency.  Musculoskeletal: Negative for back pain, myalgias and neck pain.  Skin: Negative for rash.  Allergic/Immunologic: Negative for environmental allergies.  Neurological: Negative for dizziness, tremors, seizures, weakness and headaches.  Hematological: Does not bruise/bleed easily.  Psychiatric/Behavioral: Negative for hallucinations and suicidal ideas. The patient is not nervous/anxious.     Social History   Socioeconomic History  . Marital status: Married    Spouse name: Chrissie Noa  . Number of children: 2  . Years of education: College  . Highest education level: Bachelor's degree (e.g., BA, AB, BS)  Occupational History  . Occupation: Retired Tour manager  . Financial resource strain: Not hard at all  . Food insecurity:    Worry: Never true    Inability: Never true  . Transportation needs:    Medical: No    Non-medical: No  Tobacco Use  .  Smoking status: Never Smoker  . Smokeless tobacco: Never Used  Substance and Sexual Activity  . Alcohol use: Yes    Alcohol/week: 4.2 - 6.0 oz    Types: 7 Glasses of wine per week   . Drug use: No  . Sexual activity: Not on file  Lifestyle  . Physical activity:    Days per week: Not on file    Minutes per session: Not on file  . Stress: Not at all  Relationships  . Social connections:    Talks on phone: Not on file    Gets together: Not on file    Attends religious service: Not on file    Active member of club or organization: Not on file    Attends meetings of clubs or organizations: Not on file    Relationship status: Not on file  . Intimate partner violence:    Fear of current or ex partner: Not on file    Emotionally abused: Not on file    Physically abused: Not on file    Forced sexual activity: Not on file  Other Topics Concern  . Not on file  Social History Narrative  . Not on file    Past Medical History:  Diagnosis Date  . Allergy   . Glaucoma   . Hyperlipidemia   . Hypertension   . Osteoporosis   . Pneumonia 03/29/2015     Patient Active Problem List   Diagnosis Date Noted  . Abnormal chest x-ray 05/08/2015  . Bloodgood disease 05/08/2015  . Thoracic, thoracolumbar and lumbosacral intervertebral disc disorder 05/08/2015  . Osteoporosis 04/07/2015  . Vitamin D deficiency 03/29/2015  . Open-angle glaucoma 03/29/2015  . Hypertension 03/29/2015  . Seasonal allergies 03/29/2015  . Osteoarthritis 03/29/2015  . Fibrocystic breast disease 03/29/2015  . Hyperglycemia 03/29/2015  . Compression fracture of L1 lumbar vertebra (HCC) 03/29/2015  . History of hip fracture 03/29/2015  . Hyperlipemia 02/28/2015    Past Surgical History:  Procedure Laterality Date  . CATARACT EXTRACTION  2004  . DILATION AND CURETTAGE OF UTERUS    . HIP FRACTURE SURGERY  09/15/2010   left hip  . WRIST FRACTURE SURGERY     ORIF    Her family history includes Cancer in her son; Healthy in her brother; Heart attack in her father; Uterine cancer in her mother.      Current Outpatient Medications:  .  Acetaminophen (ACETAMINOPHEN EXTRA STRENGTH) 500 MG  coapsule, Take 2 capsules by mouth as needed for fever., Disp: , Rfl:  .  alendronate (FOSAMAX) 70 MG tablet, TAKE ONE TABLET EVERY WEEK, Disp: 4 tablet, Rfl: 12 .  amLODipine (NORVASC) 2.5 MG tablet, TAKE ONE (1) TABLET EACH DAY, Disp: 30 tablet, Rfl: 12 .  aspirin 81 MG tablet, Take 81 mg by mouth daily., Disp: , Rfl:  .  cholecalciferol (VITAMIN D) 1000 UNITS tablet, Take 2,000 Units by mouth daily. , Disp: , Rfl:  .  Coenzyme Q10 (CO Q-10) 200 MG CAPS, Take 200 mg by mouth daily., Disp: , Rfl:  .  fluticasone (FLONASE) 50 MCG/ACT nasal spray, Place 2 sprays into both nostrils daily. Reported on 11/23/2015 (Patient taking differently: Place 2 sprays into both nostrils daily. Reported on 11/23/2015), Disp: 16 g, Rfl: 11 .  hydrochlorothiazide (HYDRODIURIL) 12.5 MG tablet, TAKE ONE (1) TABLET EACH DAY, Disp: 90 tablet, Rfl: 4 .  latanoprost (XALATAN) 0.005 % ophthalmic solution, Place 1 drop into both eyes at bedtime., Disp: , Rfl:  .  loratadine (  CLARITIN) 10 MG tablet, Take 10 mg by mouth daily as needed for allergies., Disp: , Rfl:  .  lovastatin (MEVACOR) 40 MG tablet, TAKE ONE (1) TABLET AT BEDTIME, Disp: 30 tablet, Rfl: 12 .  metoprolol succinate (TOPROL-XL) 50 MG 24 hr tablet, TAKE ONE TABLET DAILY WITH ANY MEAL OR IMMEDIATELY FOLLOWING A MEAL, Disp: 30 tablet, Rfl: 12 .  quinapril (ACCUPRIL) 40 MG tablet, Take 1 tablet (40 mg total) by mouth at bedtime., Disp: 30 tablet, Rfl: 11 .  timolol (TIMOPTIC) 0.5 % ophthalmic solution, Place 1 drop into both eyes daily. , Disp: , Rfl:   Patient Care Team: Birdie Sons, MD as PCP - General (Family Medicine) Warden Fillers, MD as Consulting Physician (Ophthalmology) Ward, Honor Loh, MD as Referring Physician (Obstetrics and Gynecology) Chelsea Aus, DDS as Consulting Physician (Dentistry) Clyde Canterbury, MD as Referring Physician (Otolaryngology) Gardiner Barefoot, DPM as Consulting Physician (Podiatry) Danella Sensing, MD as Consulting Physician  (Dermatology)     Objective:   Vitals:   140/72 (BP Location: Right Arm)    Pulse 72    Temp 98.1 F (36.7 C) (Oral)    Ht 5\' 2"  (1.575 m)    Wt 158 lb 12.8 oz (72 kg)    BMI 29.04 kg/m    BSA 1.77 m    Physical Exam    General Appearance:    Alert, cooperative, no distress, appears stated age  Head:    Normocephalic, without obvious abnormality, atraumatic  Eyes:    PERRL, conjunctiva/corneas clear, EOM's intact, fundi    benign, both eyes  Ears:    Normal TM's and external ear canals, both ears  Nose:   Nares normal, septum midline, mucosa normal, no drainage    or sinus tenderness  Throat:   Lips, mucosa, and tongue normal; teeth and gums normal  Neck:   Supple, symmetrical, trachea midline, no adenopathy;    thyroid:  no enlargement/tenderness/nodules; no carotid   bruit or JVD  Back:     Symmetric, no curvature, ROM normal, no CVA tenderness  Lungs:     Clear to auscultation bilaterally, respirations unlabored  Chest Wall:    No tenderness or deformity   Heart:    Regular rate and rhythm, S1 and S2 normal, no murmur, rub   or gallop  Breast Exam:    deferred  Abdomen:     Soft, non-tender, bowel sounds active all four quadrants,    no masses, no organomegaly  Pelvic:    deferred  Extremities:   Extremities normal, atraumatic, no cyanosis or edema  Pulses:   2+ and symmetric all extremities  Skin:   Skin color, texture, turgor normal, no rashes or lesions  Lymph nodes:   Cervical, supraclavicular, and axillary nodes normal  Neurologic:   CNII-XII intact, normal strength, sensation and reflexes    throughout    Activities of Daily Living In your present state of health, do you have any difficulty performing the following activities: 12/24/2017  Hearing? N  Vision? N  Difficulty concentrating or making decisions? N  Walking or climbing stairs? N  Dressing or bathing? N  Doing errands, shopping? N  Preparing Food and eating ? N  Using the Toilet? N    In the past six months, have you accidently leaked urine? N  Do you have problems with loss of bowel control? N  Managing your Medications? N  Managing your Finances? N  Housekeeping or managing your Housekeeping? N  Some recent data might  be hidden    Fall Risk Assessment Fall Risk  12/24/2017 12/04/2016 11/23/2015  Falls in the past year? No No No     Depression Screen PHQ 2/9 Scores 12/24/2017 12/24/2017 12/04/2016 12/04/2016  PHQ - 2 Score 0 0 0 0  PHQ- 9 Score 1 - 0 -      Assessment & Plan:    Annual Physical Reviewed patient's Family Medical History Reviewed and updated list of patient's medical providers Assessment of cognitive impairment was done Assessed patient's functional ability Established a written schedule for health screening [place] Completed and Reviewed  Exercise Activities and Dietary recommendations Goals    . DIET - INCREASE WATER INTAKE     Recommend increasing water intake to 4-6 glasses a day.        Immunization History  Administered Date(s) Administered  . Influenza Split 04/29/2011, 05/04/2012  . Influenza, High Dose Seasonal PF 05/08/2015, 05/30/2016, 06/10/2017  . Influenza,inj,Quad PF,6+ Mos 05/03/2013, 04/27/2014  . Pneumococcal Conjugate-13 11/03/2013  . Pneumococcal Polysaccharide-23 06/04/2008  . Td 04/13/2008  . Tdap 04/29/2011  . Zoster 04/13/2008    Health Maintenance  Topic Date Due  . INFLUENZA VACCINE  03/12/2018  . DEXA SCAN  07/12/2019  . TETANUS/TDAP  04/28/2021  . PNA vac Low Risk Adult  Completed     Discussed health benefits of physical activity, and encouraged her to engage in regular exercise appropriate for her age and condition.    ------------------------------------------------------------------------------------------------------------  1. Annual physical exam Doing well. Discussed and recommended Shingrix vaccine to get from pharmacy.    2.  Essential hypertension Well  controlled. Continue current medications.    3. Osteoporosis without current pathological fracture, unspecified osteoporosis type Doing well on alendronate which she has been taking since November 2014.    Lelon Huh, MD  [facility]

## 2017-12-24 NOTE — Progress Notes (Signed)
Subjective:   Anna Johnson is a 77 y.o. female who presents for Medicare Annual (Subsequent) preventive examination.  Review of Systems:  N/A  Cardiac Risk Factors include: advanced age (>31men, >58 women);dyslipidemia;hypertension     Objective:     Vitals: BP 140/72 (BP Location: Right Arm)   Pulse 72   Temp 98.1 F (36.7 C) (Oral)   Ht 5\' 2"  (1.575 m)   Wt 158 lb 12.8 oz (72 kg)   BMI 29.04 kg/m   Body mass index is 29.04 kg/m.  Advanced Directives 12/24/2017 12/04/2016 11/23/2015 05/08/2015  Does Patient Have a Medical Advance Directive? Yes No Yes Yes  Type of [employer] Power of [place];Living will  Copy of [place] in Chart? No - copy requested - - -  Would patient like information on creating a medical advance directive? - (No Data) - -    Tobacco Social History   Tobacco Use  Smoking Status Never Smoker  Smokeless Tobacco Never Used     Counseling given: Not Answered   Clinical Intake:  Pre-visit preparation completed: Yes  Pain : No/denies pain Pain Score: 0-No pain     Nutritional Status: BMI 25 -29 Overweight Nutritional Risks: None Diabetes: No  How often do you need to have someone help you when you read instructions, pamphlets, or other written materials from your doctor or pharmacy?: 1 - Never  Interpreter Needed?: No  Information entered by :: [facility], LPN  Past Medical History:  Diagnosis Date  . Allergy   . Glaucoma   . Hyperlipidemia   . Hypertension   . Osteoporosis   . Pneumonia 03/29/2015   Past Surgical History:  Procedure Laterality Date  . CATARACT EXTRACTION  2004  . DILATION AND CURETTAGE OF UTERUS    . HIP FRACTURE SURGERY  09/15/2010   left hip  . WRIST FRACTURE SURGERY     ORIF   Family History  Problem Relation Age of Onset  . Uterine cancer Mother   . Heart attack Father   . Healthy Brother   . Cancer Son         colon, died from sx   Social History   Socioeconomic History  . Marital status: Married    Spouse name: Anna Johnson  . Number of children: 2  . Years of education: College  . Highest education level: Bachelor's degree (e.g., BA, AB, BS)  Occupational History  . Occupation: Retired Tour manager  . Financial resource strain: Not hard at all  . Food insecurity:    Worry: Never true    Inability: Never true  . Transportation needs:    Medical: No    Non-medical: No  Tobacco Use  . Smoking status: Never Smoker  . Smokeless tobacco: Never Used  Substance and Sexual Activity  . Alcohol use: Yes    Alcohol/week: 4.2 - 6.0 oz    Types: 7 Glasses of wine per week  . Drug use: No  . Sexual activity: Not on file  Lifestyle  . Physical activity:    Days per week: Not on file    Minutes per session: Not on file  . Stress: Not at all  Relationships  . Social connections:    Talks on phone: Not on file    Gets together: Not on file    Attends religious service: Not on file    Active member of club or organization:  Not on file    Attends meetings of clubs or organizations: Not on file    Relationship status: Not on file  Other Topics Concern  . Not on file  Social History Narrative  . Not on file    Outpatient Encounter Medications as of 12/24/2017  Medication Sig  . Acetaminophen (ACETAMINOPHEN EXTRA STRENGTH) 500 MG coapsule Take 2 capsules by mouth as needed for fever.  Marland Kitchen alendronate (FOSAMAX) 70 MG tablet TAKE ONE TABLET EVERY WEEK  . amLODipine (NORVASC) 2.5 MG tablet TAKE ONE (1) TABLET EACH DAY  . aspirin 81 MG tablet Take 81 mg by mouth daily.  . cholecalciferol (VITAMIN D) 1000 UNITS tablet Take 2,000 Units by mouth daily.   . Coenzyme Q10 (CO Q-10) 200 MG CAPS Take 200 mg by mouth daily.  . fluticasone (FLONASE) 50 MCG/ACT nasal spray Place 2 sprays into both nostrils daily. Reported on 11/23/2015 (Patient taking differently: Place 2 sprays into both  nostrils daily. Reported on 11/23/2015)  . hydrochlorothiazide (HYDRODIURIL) 12.5 MG tablet TAKE ONE (1) TABLET EACH DAY  . latanoprost (XALATAN) 0.005 % ophthalmic solution Place 1 drop into both eyes at bedtime.  Marland Kitchen loratadine (CLARITIN) 10 MG tablet Take 10 mg by mouth daily as needed for allergies.  Marland Kitchen lovastatin (MEVACOR) 40 MG tablet TAKE ONE (1) TABLET AT BEDTIME  . metoprolol succinate (TOPROL-XL) 50 MG 24 hr tablet TAKE ONE TABLET DAILY WITH ANY MEAL OR IMMEDIATELY FOLLOWING A MEAL  . quinapril (ACCUPRIL) 40 MG tablet Take 1 tablet (40 mg total) by mouth at bedtime.  . timolol (TIMOPTIC) 0.5 % ophthalmic solution Place 1 drop into both eyes daily.   . meloxicam (MOBIC) 15 MG tablet Take 1 tablet (15 mg total) by mouth daily. Reported on 11/23/2015 (Patient not taking: Reported on 12/04/2016)  . [DISCONTINUED] cephALEXin (KEFLEX) 500 MG capsule Take 1 capsule (500 mg total) by mouth 2 (two) times daily.  . [DISCONTINUED] co-enzyme Q-10 50 MG capsule Take 50 mg by mouth daily.   No facility-administered encounter medications on file as of 12/24/2017.     Activities of Daily Living In your present state of health, do you have any difficulty performing the following activities: 12/24/2017  Hearing? N  Vision? N  Difficulty concentrating or making decisions? N  Walking or climbing stairs? N  Dressing or bathing? N  Doing errands, shopping? N  Preparing Food and eating ? N  Using the Toilet? N  In the past six months, have you accidently leaked urine? N  Do you have problems with loss of bowel control? N  Managing your Medications? N  Managing your Finances? N  Housekeeping or managing your Housekeeping? N  Some recent data might be hidden    Patient Care Team: Anna Sons, MD as PCP - General (Family Medicine) Anna Fillers, MD as Consulting Physician (Ophthalmology) Ward, Anna Loh, MD as Referring Physician (Obstetrics and Gynecology) Anna Johnson, DDS as Consulting  Physician (Dentistry) Anna Canterbury, MD as Referring Physician (Otolaryngology) Anna Johnson, DPM as Consulting Physician (Podiatry) Anna Sensing, MD as Consulting Physician (Dermatology)    Assessment:   This is a routine wellness examination for Anna Johnson.  Exercise Activities and Dietary recommendations Current Exercise Habits: The patient does not participate in regular exercise at present, Exercise limited by: None identified  Goals    . DIET - INCREASE WATER INTAKE     Recommend increasing water intake to 4-6 glasses a day.        Fall Risk Fall  Risk  12/24/2017 12/04/2016 11/23/2015  Falls in the past year? No No No   Is the patient's home free of loose throw rugs in walkways, pet beds, electrical cords, etc?   yes      Grab bars in the bathroom? yes      Handrails on the stairs?   no      Adequate lighting?   yes  Timed Get Up and Go performed: N/A  Depression Screen PHQ 2/9 Scores 12/24/2017 12/24/2017 12/04/2016 12/04/2016  PHQ - 2 Score 0 0 0 0  PHQ- 9 Score 1 - 0 -     Cognitive Function     6CIT Screen 12/24/2017 12/04/2016  What Year? 0 points 0 points  What month? 0 points 0 points  What time? 0 points 0 points  Count back from 20 0 points 0 points  Months in reverse 0 points 0 points  Repeat phrase 0 points 0 points  Total Score 0 0    Immunization History  Administered Date(s) Administered  . Influenza Split 04/29/2011, 05/04/2012  . Influenza, High Dose Seasonal PF 05/08/2015, 05/30/2016, 06/10/2017  . Influenza,inj,Quad PF,6+ Mos 05/03/2013, 04/27/2014  . Pneumococcal Conjugate-13 11/03/2013  . Pneumococcal Polysaccharide-23 06/04/2008  . Td 04/13/2008  . Tdap 04/29/2011  . Zoster 04/13/2008    Qualifies for Shingles Vaccine? Due for Shingles vaccine. Declined my offer to administer today. Education has been provided regarding the importance of this vaccine. Pt has been advised to call her insurance company to determine her out of pocket expense.  Advised she may also receive this vaccine at her local pharmacy or Health Dept. Verbalized acceptance and understanding.  Screening Tests Health Maintenance  Topic Date Due  . INFLUENZA VACCINE  03/12/2018  . DEXA SCAN  07/12/2019  . TETANUS/TDAP  04/28/2021  . PNA vac Low Risk Adult  Completed    Cancer Screenings: Lung: Low Dose CT Chest recommended if Age 58-80 years, 30 pack-year currently smoking OR have quit w/in 15years. Patient does not qualify. Breast:  Up to date on Mammogram? Yes   Up to date of Bone Density/Dexa? Yes Colorectal: Up to date  Additional Screenings:  Hepatitis C Screening: N/A     Plan:  I have personally reviewed and addressed the Medicare Annual Wellness questionnaire and have noted the following in the patient's chart:  A. Medical and social history B. Use of alcohol, tobacco or illicit drugs  C. Current medications and supplements D. Functional ability and status E.  Nutritional status F.  Physical activity G. Advance directives H. List of other physicians I.  Hospitalizations, surgeries, and ER visits in previous 12 months J.  [place] such as hearing and vision if needed, cognitive and depression L. Referrals and appointments - none  In addition, I have reviewed and discussed with patient certain preventive protocols, quality metrics, and best practice recommendations. A written personalized care plan for preventive services as well as general preventive health recommendations were provided to patient.  See attached scanned questionnaire for additional information.   Signed,  Fabio Neighbors, LPN Nurse Health Advisor   Nurse Recommendations: None.

## 2018-02-03 ENCOUNTER — Other Ambulatory Visit: Payer: Self-pay | Admitting: Obstetrics & Gynecology

## 2018-02-03 DIAGNOSIS — Z1231 Encounter for screening mammogram for malignant neoplasm of breast: Secondary | ICD-10-CM

## 2018-02-20 ENCOUNTER — Ambulatory Visit
Admission: RE | Admit: 2018-02-20 | Discharge: 2018-02-20 | Disposition: A | Discharge status: Home / Self Care | Payer: Medicare Other | Source: Ambulatory Visit | Attending: Obstetrics & Gynecology | Admitting: Obstetrics & Gynecology

## 2018-02-20 DIAGNOSIS — Z1231 Encounter for screening mammogram for malignant neoplasm of breast: Secondary | ICD-10-CM | POA: Diagnosis not present

## 2018-02-23 ENCOUNTER — Encounter: Payer: Self-pay | Admitting: Podiatry

## 2018-02-23 ENCOUNTER — Ambulatory Visit: Payer: Medicare Other | Admitting: Podiatry

## 2018-02-23 ENCOUNTER — Other Ambulatory Visit: Payer: Self-pay | Admitting: Obstetrics & Gynecology

## 2018-02-23 DIAGNOSIS — M79675 Pain in left toe(s): Secondary | ICD-10-CM

## 2018-02-23 DIAGNOSIS — B351 Tinea unguium: Secondary | ICD-10-CM

## 2018-02-23 DIAGNOSIS — N6489 Other specified disorders of breast: Secondary | ICD-10-CM

## 2018-02-23 DIAGNOSIS — M79674 Pain in right toe(s): Secondary | ICD-10-CM | POA: Diagnosis not present

## 2018-02-23 DIAGNOSIS — R928 Other abnormal and inconclusive findings on diagnostic imaging of breast: Secondary | ICD-10-CM

## 2018-02-23 NOTE — Progress Notes (Signed)
Complaint:  Visit Type: Patient returns to my office for treatment of her long thick painful nails.  She says her nails are painful walking and wearing her shoes.  This patient is unable to self treat.  Patient presents for preventative foot care services.   Podiatric Exam: Vascular: dorsalis pedis and posterior tibial pulses are palpable bilateral. Capillary return is immediate. Temperature gradient is WNL. Skin turgor WNL  Sensorium: Normal Semmes Weinstein monofilament test. Normal tactile sensation bilaterally. Nail Exam: Pt has thick disfigured discolored nails with subungual debris noted bilateral entire nail hallux through fifth toenails Ulcer Exam: There is no evidence of ulcer or pre-ulcerative changes or infection. Orthopedic Exam: Muscle tone and strength are WNL. No limitations in general ROM. No crepitus or effusions noted. Foot type and digits show no abnormalities. Bony prominences are unremarkable. Skin: No Porokeratosis. No infection or ulcers.  Distal clavi 3rd toe left foot. Listers corn fifth right asymptomatic.  Diagnosis:  Onychomycosis, , Pain in right toe, pain in left toes Clavi left foot  Treatment & Plan Procedures and Treatment: Consent by patient was obtained for treatment procedures.   Debridement of mycotic and hypertrophic toenails, 1 through 5 bilateral and clearing of subungual debris. No ulceration, no infection noted.  Return Visit-Office Procedure: Patient instructed to return to the office for a follow up visit 3 months for continued evaluation and treatment.    Gardiner Barefoot DPM

## 2018-02-26 ENCOUNTER — Ambulatory Visit: Payer: Medicare Other | Admitting: Podiatry

## 2018-03-06 ENCOUNTER — Ambulatory Visit
Admission: RE | Admit: 2018-03-06 | Discharge: 2018-03-06 | Disposition: A | Discharge status: Home / Self Care | Payer: Medicare Other | Source: Ambulatory Visit | Attending: Obstetrics & Gynecology | Admitting: Obstetrics & Gynecology

## 2018-03-06 DIAGNOSIS — R928 Other abnormal and inconclusive findings on diagnostic imaging of breast: Secondary | ICD-10-CM | POA: Insufficient documentation

## 2018-03-06 DIAGNOSIS — N6489 Other specified disorders of breast: Secondary | ICD-10-CM

## 2018-03-11 ENCOUNTER — Other Ambulatory Visit: Payer: Self-pay | Admitting: Obstetrics & Gynecology

## 2018-03-11 DIAGNOSIS — N6320 Unspecified lump in the left breast, unspecified quadrant: Secondary | ICD-10-CM

## 2018-03-11 DIAGNOSIS — R928 Other abnormal and inconclusive findings on diagnostic imaging of breast: Secondary | ICD-10-CM

## 2018-03-16 ENCOUNTER — Ambulatory Visit
Admission: RE | Admit: 2018-03-16 | Discharge: 2018-03-16 | Disposition: A | Discharge status: Home / Self Care | Payer: Medicare Other | Source: Ambulatory Visit | Attending: Obstetrics & Gynecology | Admitting: Obstetrics & Gynecology

## 2018-03-16 DIAGNOSIS — N6320 Unspecified lump in the left breast, unspecified quadrant: Secondary | ICD-10-CM | POA: Diagnosis present

## 2018-03-16 DIAGNOSIS — R928 Other abnormal and inconclusive findings on diagnostic imaging of breast: Secondary | ICD-10-CM | POA: Insufficient documentation

## 2018-03-16 HISTORY — PX: BREAST BIOPSY: SHX20

## 2018-03-19 ENCOUNTER — Encounter: Payer: Self-pay | Admitting: *Deleted

## 2018-03-19 ENCOUNTER — Other Ambulatory Visit: Payer: Self-pay | Admitting: Anatomic Pathology & Clinical Pathology

## 2018-03-19 LAB — SURGICAL PATHOLOGY

## 2018-03-19 NOTE — Progress Notes (Signed)
  Oncology Nurse Navigator Documentation  Navigator Location: [facility] (03/19/18 1000)   )                                                     Time Spent with Patient: 15 (03/19/18 1000)   Per notes from Dr. [name] patient has requested to be referred to Anna Johnson for her new diagnosis of breast cancer.  No navigation services needed.

## 2018-04-03 DIAGNOSIS — C50212 Malignant neoplasm of 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: Secondary | ICD-10-CM | POA: Insufficient documentation

## 2018-04-06 DIAGNOSIS — H409 Unspecified glaucoma: Secondary | ICD-10-CM | POA: Insufficient documentation

## 2018-04-12 HISTORY — PX: BREAST LUMPECTOMY: SHX2

## 2018-06-01 ENCOUNTER — Encounter: Payer: Self-pay | Admitting: Podiatry

## 2018-06-01 ENCOUNTER — Ambulatory Visit: Payer: Medicare Other | Admitting: Podiatry

## 2018-06-01 DIAGNOSIS — M79674 Pain in right toe(s): Secondary | ICD-10-CM | POA: Diagnosis not present

## 2018-06-01 DIAGNOSIS — M79675 Pain in left toe(s): Secondary | ICD-10-CM

## 2018-06-01 DIAGNOSIS — L84 Corns and callosities: Secondary | ICD-10-CM

## 2018-06-01 DIAGNOSIS — B351 Tinea unguium: Secondary | ICD-10-CM | POA: Diagnosis not present

## 2018-06-01 NOTE — Progress Notes (Signed)
Complaint:  Visit Type: Patient returns to my office for treatment of her long thick painful nails.  She says her nails are painful walking and wearing her shoes.  This patient is unable to self treat.  Patient presents for preventative foot care services.   Podiatric Exam: Vascular: dorsalis pedis and posterior tibial pulses are palpable bilateral. Capillary return is immediate. Temperature gradient is WNL. Skin turgor WNL  Sensorium: Normal Semmes Weinstein monofilament test. Normal tactile sensation bilaterally. Nail Exam: Pt has thick disfigured discolored nails with subungual debris noted bilateral entire nail hallux through fifth toenails Ulcer Exam: There is no evidence of ulcer or pre-ulcerative changes or infection. Orthopedic Exam: Muscle tone and strength are WNL. No limitations in general ROM. No crepitus or effusions noted. Foot type and digits show no abnormalities. Bony prominence 2nd  MCJ right foot with mild neuritis.  Hallux limitus 1st MPJ  B/L. Skin: No Porokeratosis. No infection or ulcers.  Distal clavi 3rd toe left foot. Listers corn fifth right asymptomatic.  Diagnosis:  Onychomycosis, , Pain in right toe, pain in left toes Clavi left foot  Treatment & Plan Procedures and Treatment: Consent by patient was obtained for treatment procedures.   Debridement of mycotic and hypertrophic toenails, 1 through 5 bilateral and clearing of subungual debris. No ulceration, no infection noted.  Return Visit-Office Procedure: Patient instructed to return to the office for a follow up visit 3 months for continued evaluation and treatment.    Gardiner Barefoot DPM

## 2018-06-15 ENCOUNTER — Other Ambulatory Visit: Payer: Self-pay | Admitting: Family Medicine

## 2018-06-15 DIAGNOSIS — J302 Other seasonal allergic rhinitis: Secondary | ICD-10-CM

## 2018-07-01 ENCOUNTER — Encounter: Payer: Self-pay | Admitting: Family Medicine

## 2018-07-01 ENCOUNTER — Ambulatory Visit: Payer: Medicare Other | Admitting: Family Medicine

## 2018-07-01 VITALS — BP 133/75 | HR 65 | Temp 98.5°F | Resp 16 | Wt 157.0 lb

## 2018-07-01 DIAGNOSIS — E559 Vitamin D deficiency, unspecified: Secondary | ICD-10-CM | POA: Diagnosis not present

## 2018-07-01 DIAGNOSIS — E785 Hyperlipidemia, unspecified: Secondary | ICD-10-CM

## 2018-07-01 DIAGNOSIS — Z23 Encounter for immunization: Secondary | ICD-10-CM | POA: Diagnosis not present

## 2018-07-01 DIAGNOSIS — C50212 Malignant neoplasm of 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: Secondary | ICD-10-CM

## 2018-07-01 DIAGNOSIS — M810 Age-related osteoporosis without current pathological fracture: Secondary | ICD-10-CM

## 2018-07-01 NOTE — Patient Instructions (Addendum)
   The CDC recommends two doses of Shingrix (the shingles vaccine) separated by 2 to 6 months for adults age 78 years and older. I recommend checking with your insurance plan regarding coverage for this vaccine.    . Please go to the lab draw station in [address] on the second floor of [facility] when you are fasting

## 2018-07-01 NOTE — Progress Notes (Signed)
Patient: Anna Johnson Female    DOB: 1941-05-27   77 y.o.   MRN: [id number] Visit Date: 07/01/2018  Today's Provider: Lelon Huh, MD   Chief Complaint  Patient presents with  . Hyperlipidemia  . Hypertension   Subjective:    HPI  Hypertension, follow-up:  BP Readings from Last 3 Encounters:  07/01/18 133/75  12/24/17 140/72  07/17/17 139/75    She was last seen for hypertension 6 months ago.  BP at that visit was . Management since that visit includes none.She reports good compliance with treatment. She is not having side effects.  She is exercising. She is not adherent to low salt diet.   Outside blood pressures are n/a. She is experiencing none.  Patient denies chest pain, chest pressure/discomfort, fatigue, irregular heart beat, lower extremity edema and palpitations.   Cardiovascular risk factors include hypertension.  Use of agents associated with hypertension: none.   ------------------------------------------------------------------------    Lipid/Cholesterol, Follow-up:   Last seen for this 1 years ago.  Management since that visit includes none.  Last Lipid Panel:    Component Value Date/Time   CHOL 187 06/11/2017 0832   CHOL 165 05/31/2016 0824   TRIG 54 06/11/2017 0832   HDL 89 06/11/2017 0832   HDL 75 05/31/2016 0824   CHOLHDL 2.1 06/11/2017 0832   LDLCALC 85 06/11/2017 0832    She reports good compliance with treatment. She is not having side effects.   Wt Readings from Last 3 Encounters:  07/01/18 157 lb (71.2 kg)  12/24/17 158 lb 12.8 oz (72 kg)  06/10/17 161 lb (73 kg)    ------------------------------------------------------------------------  Hyperglycemia  Last office visit was 1 year ago no changes. Last A1C was 5.5.  He is also here to follow up on vitamin d deficiency. Is taking supplemental vitamin d every day and tolerating well. Is taking alendronate consistently and tolerating well.   Allergies  Allergen  Reactions  . Codeine Other (See Comments)  . Seasonal Ic [Cholestatin]   . Covera-Hs [Verapamil] Rash     Current Outpatient Medications:  .  Acetaminophen (ACETAMINOPHEN EXTRA STRENGTH) 500 MG coapsule, Take 2 capsules by mouth as needed for fever., Disp: , Rfl:  .  alendronate (FOSAMAX) 70 MG tablet, TAKE ONE TABLET EVERY WEEK, Disp: 4 tablet, Rfl: 12 .  amLODipine (NORVASC) 2.5 MG tablet, TAKE ONE (1) TABLET EACH DAY, Disp: 30 tablet, Rfl: 12 .  aspirin 81 MG tablet, Take 81 mg by mouth daily., Disp: , Rfl:  .  cholecalciferol (VITAMIN D) 1000 UNITS tablet, Take 2,000 Units by mouth daily. , Disp: , Rfl:  .  Coenzyme Q10 (CO Q-10) 200 MG CAPS, Take 200 mg by mouth daily., Disp: , Rfl:  .  fluticasone (FLONASE) 50 MCG/ACT nasal spray, USE TWO SPRAYS IN EACH NOSTRIL EACH DAY AS DIRECTED BY PHYSICIAN, Disp: 16 g, Rfl: 10 .  hydrochlorothiazide (HYDRODIURIL) 12.5 MG tablet, TAKE ONE (1) TABLET EACH DAY, Disp: 90 tablet, Rfl: 4 .  latanoprost (XALATAN) 0.005 % ophthalmic solution, Place 1 drop into both eyes at bedtime., Disp: , Rfl:  .  letrozole (FEMARA) 2.5 MG tablet, Take 2.5 mg by mouth daily., Disp: , Rfl:  .  loratadine (CLARITIN) 10 MG tablet, Take 10 mg by mouth daily as needed for allergies., Disp: , Rfl:  .  lovastatin (MEVACOR) 40 MG tablet, TAKE ONE (1) TABLET AT BEDTIME, Disp: 30 tablet, Rfl: 12 .  metoprolol succinate (TOPROL-XL) 50 MG 24  hr tablet, TAKE ONE TABLET DAILY WITH ANY MEAL OR IMMEDIATELY FOLLOWING A MEAL, Disp: 30 tablet, Rfl: 12 .  quinapril (ACCUPRIL) 40 MG tablet, Take 1 tablet (40 mg total) by mouth at bedtime., Disp: 30 tablet, Rfl: 11 .  timolol (BETIMOL) 0.5 % ophthalmic solution, Apply to eye., Disp: , Rfl:  .  timolol (TIMOPTIC) 0.5 % ophthalmic solution, Place 1 drop into both eyes daily. , Disp: , Rfl:   Review of Systems  Constitutional: Negative.   HENT: Negative.   Respiratory: Negative.   Cardiovascular: Negative.   Gastrointestinal: Negative.     Genitourinary: Negative.   Neurological: Negative.     Social History   Tobacco Use  . Smoking status: Never Smoker  . Smokeless tobacco: Never Used  Substance Use Topics  . Alcohol use: Yes    Alcohol/week: 7.0 - 10.0 standard drinks    Types: 7 Glasses of wine per week   Objective:   BP 133/75 (BP Location: Right Arm, Patient Position: Sitting, Cuff Size: Normal)   Pulse 65   Temp 98.5 F (36.9 C) (Oral)   Resp 16   Wt 157 lb (71.2 kg)   SpO2 96%   BMI 28.72 kg/m  Vitals:   07/01/18 1049  BP: 133/75  Pulse: 65  Resp: 16  Temp: 98.5 F (36.9 C)  TempSrc: Oral  SpO2: 96%  Weight: 157 lb (71.2 kg)     Physical Exam   General Appearance:    Alert, cooperative, no distress  Eyes:    PERRL, conjunctiva/corneas clear, EOM's intact       Lungs:     Clear to auscultation bilaterally, respirations unlabored  Heart:    Regular rate and rhythm  Neurologic:   Awake, alert, oriented x 3. No apparent focal neurological           defect.         Assessment & Plan:     1. Osteoporosis without current pathological fracture, unspecified osteoporosis type Doing well with alendronate. Due for - DG Bone Density; Future  2. Hyperlipidemia, unspecified hyperlipidemia type She is tolerating lovastatin well with no adverse effects.   - Lipid panel  3. Vitamin D deficiency Doing well with supplemental vitamin d . - VITAMIN D 25 Hydroxy (Vit-D Deficiency, Fractures)  4. Need for pneumococcal vaccination  - Pneumococcal polysaccharide vaccine 23-valent greater than or equal to 2yo subcutaneous/IM  5. Malignant neoplasm of 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, unspecified estrogen receptor status (HCC)  - Pneumococcal polysaccharide vaccine 23-valent greater than or equal to 2yo subcutaneous/IM  6. Influenza vaccine needed  - Flu vaccine HIGH DOSE PF       Lelon Huh, MD  [facility]

## 2018-07-03 ENCOUNTER — Telehealth: Payer: Self-pay | Admitting: *Deleted

## 2018-07-03 LAB — LIPID PANEL
Chol/HDL Ratio: 2.6 ratio (ref 0.0–4.4)
Cholesterol, Total: 185 mg/dL (ref 100–199)
HDL: 72 mg/dL (ref >39)
LDL CALC: 101 mg/dL — AB (ref 0–99)
Triglycerides: 62 mg/dL (ref 0–149)
VLDL CHOLESTEROL CAL: 12 mg/dL (ref 5–40)

## 2018-07-03 LAB — VITAMIN D 25 HYDROXY (VIT D DEFICIENCY, FRACTURES): Vit D, 25-Hydroxy: 45.1 ng/mL (ref 30.0–100.0)

## 2018-07-03 NOTE — Telephone Encounter (Signed)
Patient was notified of results. Expressed understanding.  

## 2018-07-03 NOTE — Telephone Encounter (Signed)
LMOVM for pt to return call 

## 2018-07-03 NOTE — Telephone Encounter (Signed)
-----   Message from Birdie Sons, MD sent at 07/03/2018  8:01 AM EST ----- Cholesterol is good at 185. Vitamin d level is normal. Continue current medications.  Check yearly.

## 2018-07-30 ENCOUNTER — Other Ambulatory Visit: Payer: Self-pay | Admitting: Family Medicine

## 2018-07-30 DIAGNOSIS — I10 Essential (primary) hypertension: Secondary | ICD-10-CM

## 2018-08-07 ENCOUNTER — Other Ambulatory Visit: Payer: Self-pay | Admitting: Family Medicine

## 2018-08-07 DIAGNOSIS — I10 Essential (primary) hypertension: Secondary | ICD-10-CM

## 2018-08-07 MED ORDER — METOPROLOL SUCCINATE ER 50 MG PO TB24: ORAL_TABLET | ORAL | 12 refills | Status: DC

## 2018-08-07 NOTE — Telephone Encounter (Signed)
[facility] called.   Pt needing a refill/new Rx written on:  metoprolol succinate (TOPROL-XL) 50 MG 24 hr tablet   [facility]   Address: [address] [place], [place] [address] Phone: [phone]  Thanks, [facility]

## 2018-08-24 ENCOUNTER — Encounter: Payer: Self-pay | Admitting: Podiatry

## 2018-08-24 ENCOUNTER — Ambulatory Visit: Payer: Medicare Other | Admitting: Podiatry

## 2018-08-24 DIAGNOSIS — L84 Corns and callosities: Secondary | ICD-10-CM | POA: Diagnosis not present

## 2018-08-24 DIAGNOSIS — M79674 Pain in right toe(s): Secondary | ICD-10-CM | POA: Diagnosis not present

## 2018-08-24 DIAGNOSIS — B351 Tinea unguium: Secondary | ICD-10-CM | POA: Diagnosis not present

## 2018-08-24 DIAGNOSIS — M79675 Pain in left toe(s): Secondary | ICD-10-CM

## 2018-08-24 NOTE — Progress Notes (Signed)
Complaint:  Visit Type: Patient returns to my office for treatment of her long thick painful nails.  She says her nails are painful walking and wearing her shoes.  This patient is unable to self treat.  Patient presents for preventative foot care services.   Podiatric Exam: Vascular: dorsalis pedis and posterior tibial pulses are palpable bilateral. Capillary return is immediate. Temperature gradient is WNL. Skin turgor WNL  Sensorium: Normal Semmes Weinstein monofilament test. Normal tactile sensation bilaterally. Nail Exam: Pt has thick disfigured discolored nails with subungual debris noted bilateral entire nail hallux through fifth toenails Ulcer Exam: There is no evidence of ulcer or pre-ulcerative changes or infection. Orthopedic Exam: Muscle tone and strength are WNL. No limitations in general ROM. No crepitus or effusions noted. Foot type and digits show no abnormalities. Bony prominence 2nd  MCJ right foot with mild neuritis.  Hallux limitus 1st MPJ  B/L. Skin: No Porokeratosis. No infection or ulcers.  Distal clavi 3rd toe left foot. Listers corn fifth right asymptomatic. Asymptomatic HD 5th left  Diagnosis:  Onychomycosis, , Pain in right toe, pain in left toes Clavi left foot  Treatment & Plan Procedures and Treatment: Consent by patient was obtained for treatment procedures.   Debridement of mycotic and hypertrophic toenails, 1 through 5 bilateral and clearing of subungual debris. No ulceration, no infection noted.  Return Visit-Office Procedure: Patient instructed to return to the office for a follow up visit 10 weeks  for continued evaluation and treatment.    Gardiner Barefoot DPM

## 2018-08-26 ENCOUNTER — Ambulatory Visit
Admission: RE | Admit: 2018-08-26 | Discharge: 2018-08-26 | Disposition: A | Discharge status: Home / Self Care | Payer: Medicare Other | Source: Ambulatory Visit | Attending: Family Medicine | Admitting: Family Medicine

## 2018-08-26 DIAGNOSIS — M810 Age-related osteoporosis without current pathological fracture: Secondary | ICD-10-CM

## 2018-08-27 ENCOUNTER — Telehealth: Payer: Self-pay

## 2018-08-27 NOTE — Telephone Encounter (Signed)
Pt advised.  She is going to contact her oncologist.   Thanks,   Mickel Baas

## 2018-08-27 NOTE — Telephone Encounter (Signed)
-----   Message from Birdie Sons, MD sent at 08/26/2018  8:09 PM EST ----- Bone density is good, improved from last year. Can stop alendronate for now. Will recheck BMD in 12-18 months.

## 2018-08-27 NOTE — Telephone Encounter (Signed)
Pt advised.  She states she started letrozole about two months ago for breast cancer.  She is wanting to know if she should stay on alendronate.    Please advise.  Thanks,   -Mickel Baas

## 2018-08-27 NOTE — Telephone Encounter (Signed)
That's a good question. We usually have people stop alendronate for 1 year after they have taken it for 5 years to reduce the risk of 'atypical' fractures.  I would suggest she talk to her oncologist about it since the letrozole can cause bone density loss.

## 2018-08-27 NOTE — Telephone Encounter (Signed)
LMTCB 08/27/2018  Thanks,   -Mickel Baas

## 2018-11-05 ENCOUNTER — Ambulatory Visit: Payer: Medicare Other | Admitting: Podiatry

## 2018-11-28 ENCOUNTER — Other Ambulatory Visit: Payer: Self-pay | Admitting: Family Medicine

## 2018-11-28 DIAGNOSIS — I10 Essential (primary) hypertension: Secondary | ICD-10-CM

## 2018-12-30 ENCOUNTER — Ambulatory Visit: Payer: Medicare Other | Admitting: Family Medicine

## 2018-12-30 ENCOUNTER — Encounter: Payer: Self-pay | Admitting: Family Medicine

## 2018-12-30 ENCOUNTER — Other Ambulatory Visit: Payer: Self-pay

## 2018-12-30 DIAGNOSIS — I10 Essential (primary) hypertension: Secondary | ICD-10-CM | POA: Diagnosis not present

## 2018-12-30 DIAGNOSIS — M810 Age-related osteoporosis without current pathological fracture: Secondary | ICD-10-CM

## 2018-12-30 DIAGNOSIS — E785 Hyperlipidemia, unspecified: Secondary | ICD-10-CM

## 2018-12-30 MED ORDER — ALENDRONATE SODIUM 70 MG PO TABS: ORAL_TABLET | ORAL | 12 refills | Status: DC

## 2018-12-30 MED ORDER — AMLODIPINE BESYLATE 2.5 MG PO TABS: ORAL_TABLET | ORAL | 12 refills | Status: DC

## 2018-12-30 MED ORDER — LOVASTATIN 40 MG PO TABS: ORAL_TABLET | ORAL | 12 refills | Status: DC

## 2018-12-30 NOTE — Progress Notes (Signed)
Patient: Anna Johnson Female    DOB: 03-24-1941   78 y.o.   MRN: [id number] Visit Date: 12/30/2018  Today's Provider: Lelon Huh, MD   Chief Complaint  Patient presents with  . Follow-up   Subjective:     HPI  6 Month Follow Up. Patient need medication refills on Lovastatin, Amlodipine, and Alendronate.    Lipid/Cholesterol, Follow-up:   Last seen for this18 months ago.  Management changes since that visit include continuing lovastatin . Last Lipid Panel:    Component Value Date/Time   CHOL 185 07/02/2018 0834   TRIG 62 07/02/2018 0834   HDL 72 07/02/2018 0834   CHOLHDL 2.6 07/02/2018 0834   CHOLHDL 2.1 06/11/2017 0832   LDLCALC 101 (H) 07/02/2018 0834   LDLCALC 85 06/11/2017 0832     She reports excellent compliance with treatment. She is not having side effects.    Wt Readings from Last 3 Encounters:  12/30/18 153 lb 9.6 oz (69.7 kg)  07/01/18 157 lb (71.2 kg)  12/24/17 158 lb 12.8 oz (72 kg)    -------------------------------------------------------------------  Hypertension, follow-up:  BP Readings from Last 3 Encounters:  12/30/18 112/72  07/01/18 133/75  12/24/17 140/72    She was last seen for hypertension 6 months ago.  BP at that visit was well controlled. . Management since that visit includes continuing the same medications. . She reports excellent compliance with treatment. She is not having side effects.     Outside blood pressures are usually below 140/90.     Weight trend: stable Wt Readings from Last 3 Encounters:  12/30/18 153 lb 9.6 oz (69.7 kg)  07/01/18 157 lb (71.2 kg)  12/24/17 158 lb 12.8 oz (72 kg)    Current diet: in general, a "healthy" diet    ------------------------------------------------------------------------  Follow up osteoporosis Is doing well on alendronate which she is took consistently from 06/2013 through 08/2018. Is now on drug 'holiday'  Allergies  Allergen Reactions  . Codeine Other  (See Comments)  . Seasonal Ic [Cholestatin]   . Covera-Hs [Verapamil] Rash     Current Outpatient Medications:  .  Acetaminophen (ACETAMINOPHEN EXTRA STRENGTH) 500 MG coapsule, Take 2 capsules by mouth as needed for fever., Disp: , Rfl:  .  alendronate (FOSAMAX) 70 MG tablet, TAKE ONE TABLET EVERY WEEK, Disp: 4 tablet, Rfl: 12 .  amLODipine (NORVASC) 2.5 MG tablet, TAKE ONE (1) TABLET EACH DAY, Disp: 30 tablet, Rfl: 12 .  aspirin 81 MG tablet, Take 81 mg by mouth daily., Disp: , Rfl:  .  cholecalciferol (VITAMIN D) 1000 UNITS tablet, Take 2,000 Units by mouth daily. , Disp: , Rfl:  .  Coenzyme Q10 (CO Q-10) 200 MG CAPS, Take 200 mg by mouth daily., Disp: , Rfl:  .  dorzolamidel-timolol (COSOPT) 22.3-6.8 MG/ML SOLN ophthalmic solution, 1 drop 2 (two) times daily., Disp: , Rfl:  .  fluticasone (FLONASE) 50 MCG/ACT nasal spray, USE TWO SPRAYS IN EACH NOSTRIL EACH DAY AS DIRECTED BY PHYSICIAN, Disp: 16 g, Rfl: 10 .  hydrochlorothiazide (HYDRODIURIL) 12.5 MG tablet, TAKE 1 TABLET BY MOUTH EVERY DAY, Disp: 90 tablet, Rfl: 4 .  latanoprost (XALATAN) 0.005 % ophthalmic solution, Place 1 drop into both eyes at bedtime., Disp: , Rfl:  .  letrozole (FEMARA) 2.5 MG tablet, Take 2.5 mg by mouth daily., Disp: , Rfl:  .  loratadine (CLARITIN) 10 MG tablet, Take 10 mg by mouth daily as needed for allergies., Disp: , Rfl:  .  lovastatin (MEVACOR) 40 MG tablet, TAKE ONE (1) TABLET AT BEDTIME, Disp: 30 tablet, Rfl: 12 .  metoprolol succinate (TOPROL-XL) 50 MG 24 hr tablet, TAKE ONE TABLET DAILY WITH ANY MEAL OR IMMEDIATELY FOLLOWING A MEAL, Disp: 30 tablet, Rfl: 12 .  quinapril (ACCUPRIL) 40 MG tablet, TAKE ONE TABLET BY MOUTH AT BEDTIME, Disp: 30 tablet, Rfl: 11 .  timolol (BETIMOL) 0.5 % ophthalmic solution, Apply to eye., Disp: , Rfl:  .  timolol (TIMOPTIC) 0.5 % ophthalmic solution, Place 1 drop into both eyes daily. , Disp: , Rfl:   Review of Systems  Constitutional: Negative for appetite change, chills,  fatigue and fever.  Respiratory: Negative for chest tightness and shortness of breath.   Cardiovascular: Negative for chest pain and palpitations.  Gastrointestinal: Negative for abdominal pain, nausea and vomiting.  Neurological: Negative for dizziness and weakness.  All other systems reviewed and are negative.   Social History   Tobacco Use  . Smoking status: Never Smoker  . Smokeless tobacco: Never Used  Substance Use Topics  . Alcohol use: Yes    Alcohol/week: 7.0 - 10.0 standard drinks    Types: 7 Glasses of wine per week      Objective:   BP 112/72 (BP Location: Right Arm, Patient Position: Sitting, Cuff Size: Normal)   Pulse 66   Temp 98.1 F (36.7 C) (Oral)   Resp 16   Wt 153 lb 9.6 oz (69.7 kg)   BMI 28.09 kg/m  Vitals:   12/30/18 1107  BP: 112/72  Pulse: 66  Resp: 16  Temp: 98.1 F (36.7 C)  TempSrc: Oral  Weight: 153 lb 9.6 oz (69.7 kg)    Physical Exam   General Appearance:    Alert, cooperative, no distress  Eyes:    PERRL, conjunctiva/corneas clear, EOM's intact       Lungs:     Clear to auscultation bilaterally, respirations unlabored  Heart:    Regular rate and rhythm  Neurologic:   Awake, alert, oriented x 3. No apparent focal neurological           defect.           Assessment & Plan    1. Essential hypertension Well controlled.  Continue current medications.   - amLODipine (NORVASC) 2.5 MG tablet; TAKE ONE (1) TABLET EACH DAY  Dispense: 30 tablet; Refill: 12  2. Hyperlipidemia, unspecified hyperlipidemia type She is tolerating lovastatin well with no adverse effects.   - lovastatin (MEVACOR) 40 MG tablet; TAKE ONE (1) TABLET AT BEDTIME  Dispense: 30 tablet; Refill: 12  3. Osteoporosis, unspecified osteoporosis type, unspecified pathological fracture presence  Repeat BMD 2021     Lelon Huh, MD  [facility]

## 2018-12-30 NOTE — Patient Instructions (Signed)
.   Please review the attached list of medications and notify my office if there are any errors.   . Please bring all of your medications to every appointment so we can make sure that our medication list is the same as yours.   

## 2018-12-31 ENCOUNTER — Encounter: Payer: Self-pay | Admitting: Podiatry

## 2018-12-31 ENCOUNTER — Ambulatory Visit: Payer: Medicare Other | Admitting: Podiatry

## 2018-12-31 ENCOUNTER — Other Ambulatory Visit: Payer: Self-pay

## 2018-12-31 VITALS — Temp 97.4°F

## 2018-12-31 DIAGNOSIS — B351 Tinea unguium: Secondary | ICD-10-CM

## 2018-12-31 DIAGNOSIS — L84 Corns and callosities: Secondary | ICD-10-CM | POA: Diagnosis not present

## 2018-12-31 DIAGNOSIS — M79675 Pain in left toe(s): Secondary | ICD-10-CM

## 2018-12-31 DIAGNOSIS — M79674 Pain in right toe(s): Secondary | ICD-10-CM | POA: Diagnosis not present

## 2018-12-31 NOTE — Progress Notes (Signed)
Complaint:  Visit Type: Patient returns to my office for treatment of her long thick painful nails.  She says her nails are painful walking and wearing her shoes.  This patient is unable to self treat.  Patient presents for preventative foot care services.  Patient also has developed corns both feet.   Podiatric Exam: Vascular: dorsalis pedis and posterior tibial pulses are palpable bilateral. Capillary return is immediate. Temperature gradient is WNL. Skin turgor WNL  Sensorium: Normal Semmes Weinstein monofilament test. Normal tactile sensation bilaterally. Nail Exam: Pt has thick disfigured discolored nails with subungual debris noted bilateral entire nail hallux through fifth toenails Ulcer Exam: There is no evidence of ulcer or pre-ulcerative changes or infection. Orthopedic Exam: Muscle tone and strength are WNL. No limitations in general ROM. No crepitus or effusions noted. Foot type and digits show no abnormalities. Bony prominence 2nd  MCJ right foot with mild neuritis.  Hallux limitus 1st MPJ  B/L. Skin: No Porokeratosis. No infection or ulcers.  Distal clavi 3rd left.  Corn DIPJ 4th right.  Diagnosis:  Onychomycosis, , Pain in right toe, pain in left toes Clavi left foot  Corn right foot  Treatment & Plan Procedures and Treatment: Consent by patient was obtained for treatment procedures.   Debridement of mycotic and hypertrophic toenails, 1 through 5 bilateral and clearing of subungual debris. No ulceration, no infection noted. Debridement of corn/clavi  B/L.  Iatrogenic lesion 4th toenail right foot. Return Visit-Office Procedure: Patient instructed to return to the office for a follow up visit 10 weeks  for continued evaluation and treatment.    Gardiner Barefoot DPM

## 2019-03-11 ENCOUNTER — Ambulatory Visit: Payer: Medicare Other | Admitting: Podiatry

## 2019-03-18 ENCOUNTER — Ambulatory Visit (INDEPENDENT_AMBULATORY_CARE_PROVIDER_SITE_OTHER): Payer: Medicare Other | Admitting: Podiatry

## 2019-03-18 ENCOUNTER — Other Ambulatory Visit: Payer: Self-pay

## 2019-03-18 ENCOUNTER — Encounter: Payer: Self-pay | Admitting: Podiatry

## 2019-03-18 VITALS — Temp 97.6°F

## 2019-03-18 DIAGNOSIS — M79674 Pain in right toe(s): Secondary | ICD-10-CM

## 2019-03-18 DIAGNOSIS — L84 Corns and callosities: Secondary | ICD-10-CM | POA: Diagnosis not present

## 2019-03-18 DIAGNOSIS — B351 Tinea unguium: Secondary | ICD-10-CM

## 2019-03-18 DIAGNOSIS — M79675 Pain in left toe(s): Secondary | ICD-10-CM

## 2019-03-18 NOTE — Progress Notes (Signed)
Complaint:  Visit Type: Patient returns to my office for treatment of her long thick painful nails.  She says her nails are painful walking and wearing her shoes.  This patient is unable to self treat.  Patient presents for preventative foot care services.  Patient also has developed corns both feet.   Podiatric Exam: Vascular: dorsalis pedis and posterior tibial pulses are palpable bilateral. Capillary return is immediate. Temperature gradient is WNL. Skin turgor WNL  Sensorium: Normal Semmes Weinstein monofilament test. Normal tactile sensation bilaterally. Nail Exam: Pt has thick disfigured discolored nails with subungual debris noted bilateral entire nail hallux through fifth toenails Ulcer Exam: There is no evidence of ulcer or pre-ulcerative changes or infection. Orthopedic Exam: Muscle tone and strength are WNL. No limitations in general ROM. No crepitus or effusions noted. Foot type and digits show no abnormalities. Bony prominence 2nd  MCJ right foot with mild neuritis.  Hallux limitus 1st MPJ  B/L. Skin: No Porokeratosis. No infection or ulcers.  Distal clavi 3rd left.  Corn fifth toe left foot.  Diagnosis:  Onychomycosis, , Pain in right toe, pain in left toes Corn fifth toe left foot.  Treatment & Plan Procedures and Treatment: Consent by patient was obtained for treatment procedures.   Debridement of mycotic and hypertrophic toenails, 1 through 5 bilateral and clearing of subungual debris. No ulceration, no infection noted. Debridement of corn/clavi  B/L.   Return Visit-Office Procedure: Patient instructed to return to the office for a follow up visit 10 weeks  for continued evaluation and treatment.    Gardiner Barefoot DPM

## 2019-05-26 ENCOUNTER — Ambulatory Visit (INDEPENDENT_AMBULATORY_CARE_PROVIDER_SITE_OTHER): Payer: Medicare Other

## 2019-05-26 ENCOUNTER — Other Ambulatory Visit: Payer: Self-pay

## 2019-05-26 DIAGNOSIS — Z23 Encounter for immunization: Secondary | ICD-10-CM

## 2019-06-03 ENCOUNTER — Encounter: Payer: Self-pay | Admitting: Podiatry

## 2019-06-03 ENCOUNTER — Ambulatory Visit: Payer: Medicare Other | Admitting: Podiatry

## 2019-06-03 ENCOUNTER — Other Ambulatory Visit: Payer: Self-pay

## 2019-06-03 DIAGNOSIS — M79675 Pain in left toe(s): Secondary | ICD-10-CM | POA: Diagnosis not present

## 2019-06-03 DIAGNOSIS — M79674 Pain in right toe(s): Secondary | ICD-10-CM | POA: Diagnosis not present

## 2019-06-03 DIAGNOSIS — B351 Tinea unguium: Secondary | ICD-10-CM

## 2019-06-03 NOTE — Progress Notes (Signed)
Complaint:  Visit Type: Patient returns to my office for treatment of her long thick painful nails.  She says her nails are painful walking and wearing her shoes.  This patient is unable to self treat.  Patient presents for preventative foot care services.  Patient also has developed corns both feet.   Podiatric Exam: Vascular: dorsalis pedis and posterior tibial pulses are palpable bilateral. Capillary return is immediate. Temperature gradient is WNL. Skin turgor WNL  Sensorium: Normal Semmes Weinstein monofilament test. Normal tactile sensation bilaterally. Nail Exam: Pt has thick disfigured discolored nails with subungual debris noted bilateral entire nail hallux through fifth toenails Ulcer Exam: There is no evidence of ulcer or pre-ulcerative changes or infection. Orthopedic Exam: Muscle tone and strength are WNL. No limitations in general ROM. No crepitus or effusions noted. Foot type and digits show no abnormalities. Bony prominence 2nd  MCJ right foot with mild neuritis.  Hallux limitus 1st MPJ  B/L. Skin: No Porokeratosis. No infection or ulcers.  Distal clavi 3rd left.  Corn fifth toe left foot.  Diagnosis:  Onychomycosis, , Pain in right toe, pain in left toes   Treatment & Plan Procedures and Treatment: Consent by patient was obtained for treatment procedures.   Debridement of mycotic and hypertrophic toenails, 1 through 5 bilateral and clearing of subungual debris. No ulceration, no infection noted. Debridement of corn/clavi  B/L.   Return Visit-Office Procedure: Patient instructed to return to the office for a follow up visit 10 weeks  for continued evaluation and treatment.    Gardiner Barefoot DPM

## 2019-06-29 NOTE — Progress Notes (Signed)
Patient: Anna Johnson Female    DOB: 03-30-1941   78 y.o.   MRN: [id number] Visit Date: 06/30/2019  Today's Provider: Lelon Huh, MD   Chief Complaint  Patient presents with  . Hypertension  . Hyperlipidemia  . Osteoporosis   Subjective:     HPI  Lipid/Cholesterol, Follow-up:   Last seen for this 6 months ago.  Management changes since that visit include continuing Lovastatin  Last Lipid Panel: Lab Results  Component Value Date   CHOL 185 07/02/2018   HDL 72 07/02/2018   LDLCALC 101 (H) 07/02/2018   TRIG 62 07/02/2018   CHOLHDL 2.6 07/02/2018   She reports excellent compliance with treatment. She is not having side effects.       Wt Readings from Last 3 Encounters:  12/30/18 153 lb 9.6 oz (69.7 kg)  07/01/18 157 lb (71.2 kg)  12/24/17 158 lb 12.8 oz (72 kg)    -------------------------------------------------------------------  Hypertension, follow-up:     BP Readings from Last 3 Encounters:  12/30/18 112/72  07/01/18 133/75  12/24/17 140/72    She was last seen for hypertension 6 months ago.  BP at that visit was 112/72 Management since that visit includes no changes She reports excellent compliance with treatment. She is not having side effects.     Outside blood pressures are   Weight trend: stable    Wt Readings from Last 3 Encounters:  12/30/18 153 lb 9.6 oz (69.7 kg)  07/01/18 157 lb (71.2 kg)  12/24/17 158 lb 12.8 oz (72 kg)    Current diet: in general, a "healthy" diet    ------------------------------------------------------------------------  She does state that she has been off of alendronate since her BMD in January showed improvement, and she had previously been on alendronate for 5 years. However she states he oncologist was concened due to patient being on letrozole. Patient states her oncologist recommended getting another BMD in January and considered ordering injections for osteoporosis, which I  presume to be Denosumab. Patient does not report having had any side effects from   Allergies  Allergen Reactions  . Codeine Other (See Comments)  . Seasonal Ic [Cholestatin]   . Covera-Hs [Verapamil] Rash     Current Outpatient Medications:  .  Acetaminophen (ACETAMINOPHEN EXTRA STRENGTH) 500 MG coapsule, Take 2 capsules by mouth as needed for fever., Disp: , Rfl:  .  amLODipine (NORVASC) 2.5 MG tablet, TAKE ONE (1) TABLET EACH DAY, Disp: 30 tablet, Rfl: 12 .  cholecalciferol (VITAMIN D) 1000 UNITS tablet, Take 2,000 Units by mouth daily. , Disp: , Rfl:  .  Coenzyme Q10 (CO Q-10) 200 MG CAPS, Take 200 mg by mouth daily., Disp: , Rfl:  .  fluticasone (FLONASE) 50 MCG/ACT nasal spray, USE TWO SPRAYS IN EACH NOSTRIL EACH DAY AS DIRECTED BY PHYSICIAN, Disp: 16 g, Rfl: 10 .  hydrochlorothiazide (HYDRODIURIL) 12.5 MG tablet, TAKE 1 TABLET BY MOUTH EVERY DAY, Disp: 90 tablet, Rfl: 4 .  letrozole (FEMARA) 2.5 MG tablet, Take 2.5 mg by mouth daily., Disp: , Rfl:  .  loratadine (CLARITIN) 10 MG tablet, Take 10 mg by mouth daily as needed for allergies., Disp: , Rfl:  .  lovastatin (MEVACOR) 40 MG tablet, TAKE ONE (1) TABLET AT BEDTIME, Disp: 30 tablet, Rfl: 12 .  LUMIGAN 0.01 % SOLN, , Disp: , Rfl:  .  metoprolol succinate (TOPROL-XL) 50 MG 24 hr tablet, TAKE ONE TABLET DAILY WITH ANY MEAL OR IMMEDIATELY FOLLOWING A MEAL,  Disp: 90 tablet, Rfl: 4 .  quinapril (ACCUPRIL) 40 MG tablet, TAKE ONE TABLET BY MOUTH AT BEDTIME, Disp: 30 tablet, Rfl: 11 .  timolol (TIMOPTIC) 0.5 % ophthalmic solution, , Disp: , Rfl:  .  alendronate (FOSAMAX) 70 MG tablet, TAKE ONE TABLET EVERY WEEK (Patient not taking: Reported on 06/30/2019), Disp: 4 tablet, Rfl: 12 .  amoxicillin (AMOXIL) 500 MG capsule, Bottle, Disp: , Rfl:  .  aspirin 81 MG tablet, Take 81 mg by mouth daily., Disp: , Rfl:  .  dorzolamide-timolol (COSOPT) 22.3-6.8 MG/ML ophthalmic solution, , Disp: , Rfl:  .  latanoprost (XALATAN) 0.005 % ophthalmic  solution, Place 1 drop into both eyes at bedtime., Disp: , Rfl:   Review of Systems  Constitutional: Negative.   Respiratory: Negative for cough, shortness of breath and wheezing.   Cardiovascular: Negative for chest pain, palpitations and leg swelling.  Musculoskeletal: Negative.     Social History   Tobacco Use  . Smoking status: Never Smoker  . Smokeless tobacco: Never Used  Substance Use Topics  . Alcohol use: Yes    Alcohol/week: 7.0 - 10.0 standard drinks    Types: 7 Glasses of wine per week      Objective:   BP 130/80 (BP Location: Right Arm, Patient Position: Sitting, Cuff Size: Normal)   Pulse 77   Temp (!) 97.3 F (36.3 C) (Skin)   Wt 154 lb (69.9 kg)   SpO2 99%   BMI 28.17 kg/m   Vitals:   06/30/19 1105 06/30/19 1111  BP: (!) 142/88 130/80  Pulse: 77   Temp: (!) 97.3 F (36.3 C)   TempSrc: Skin   SpO2: 99%   Weight: 154 lb (69.9 kg)     Physical Exam   General Appearance:    Well developed, well nourished female in no acute distress  Eyes:    PERRL, conjunctiva/corneas clear, EOM's intact       Lungs:     Clear to auscultation bilaterally, respirations unlabored  Heart:    Normal heart rate. Normal rhythm. No murmurs, rubs, or gallops.   MS:   All extremities are intact.   Neurologic:   Awake, alert, oriented x 3. No apparent focal neurological           defect.           Assessment & Plan    1. Essential hypertension Stable. Continue current medications.   - metoprolol succinate (TOPROL-XL) 50 MG 24 hr tablet; TAKE ONE TABLET DAILY WITH ANY MEAL OR IMMEDIATELY FOLLOWING A MEAL  Dispense: 90 tablet; Refill: 4 - hydrochlorothiazide (HYDRODIURIL) 12.5 MG tablet; TAKE 1 TABLET BY MOUTH EVERY DAY  Dispense: 90 tablet; Refill: 4  2. Hyperlipidemia, unspecified hyperlipidemia type She is tolerating pravastatin well with no adverse effects.   - Lipid panel (fasting) - Comprehensive metabolic panel  3. Vitamin D deficiency  - Vitamin D (25  hydroxy)  4. Osteoporosis without current pathological fracture, unspecified osteoporosis type Current on bisphosphonate holiday and apparently oral surgeon is concerned about her being on bisphosphonate if any additional surgeries are needed. Is on - letrozole (FEMARA) 2.5 MG tablet; Take 2.5 mg by mouth daily. Anticipate BMD January 2021 for high risk medications. Consider Denosumab if she needs to be back on treatment.    5. Malignant neoplasm of 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, unspecified estrogen receptor status ([place]) On - letrozole (FEMARA) 2.5 MG tablet; Take 2.5 mg by mouth daily. Per oncology  The entirety of the  information documented in the History of Present Illness, Review of Systems and Physical Exam were personally obtained by me. Portions of this information were initially documented by Althea Charon CMA and reviewed by me for thoroughness and accuracy.       Lelon Huh, MD  [facility]

## 2019-06-30 ENCOUNTER — Other Ambulatory Visit: Payer: Self-pay

## 2019-06-30 ENCOUNTER — Ambulatory Visit: Payer: Medicare Other | Admitting: Family Medicine

## 2019-06-30 VITALS — BP 130/80 | HR 77 | Temp 97.3°F | Wt 154.0 lb

## 2019-06-30 DIAGNOSIS — I10 Essential (primary) hypertension: Secondary | ICD-10-CM | POA: Diagnosis not present

## 2019-06-30 DIAGNOSIS — E785 Hyperlipidemia, unspecified: Secondary | ICD-10-CM

## 2019-06-30 DIAGNOSIS — C50212 Malignant neoplasm of 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: Secondary | ICD-10-CM

## 2019-06-30 DIAGNOSIS — M810 Age-related osteoporosis without current pathological fracture: Secondary | ICD-10-CM

## 2019-06-30 DIAGNOSIS — E559 Vitamin D deficiency, unspecified: Secondary | ICD-10-CM | POA: Diagnosis not present

## 2019-06-30 MED ORDER — HYDROCHLOROTHIAZIDE 12.5 MG PO TABS: ORAL_TABLET | ORAL | 4 refills | Status: DC

## 2019-06-30 MED ORDER — METOPROLOL SUCCINATE ER 50 MG PO TB24: ORAL_TABLET | ORAL | 4 refills | Status: DC

## 2019-06-30 NOTE — Patient Instructions (Addendum)
.   Please review the attached list of medications and notify my office if there are any errors.   . Please bring all of your medications to every appointment so we can make sure that our medication list is the same as yours.    The CDC recommends two doses of Shingrix (the shingles vaccine) separated by 2 to 6 months for adults age 79 years and older. I recommend checking with your insurance plan regarding coverage for this vaccine.   

## 2019-07-02 LAB — COMPREHENSIVE METABOLIC PANEL
ALT: 11 IU/L (ref 0–32)
AST: 18 IU/L (ref 0–40)
Albumin/Globulin Ratio: 1.5 (ref 1.2–2.2)
Albumin: 4 g/dL (ref 3.7–4.7)
Alkaline Phosphatase: 48 IU/L (ref 39–117)
BUN/Creatinine Ratio: 18 (ref 12–28)
BUN: 13 mg/dL (ref 8–27)
Bilirubin Total: 0.5 mg/dL (ref 0.0–1.2)
CO2: 23 mmol/L (ref 20–29)
Calcium: 9.4 mg/dL (ref 8.7–10.3)
Chloride: 98 mmol/L (ref 96–106)
Creatinine, Ser: 0.74 mg/dL (ref 0.57–1.00)
GFR calc Af Amer: 89 mL/min/{1.73_m2} (ref >59)
GFR calc non Af Amer: 77 mL/min/{1.73_m2} (ref >59)
Globulin, Total: 2.6 g/dL (ref 1.5–4.5)
Glucose: 95 mg/dL (ref 65–99)
Potassium: 4.5 mmol/L (ref 3.5–5.2)
Sodium: 137 mmol/L (ref 134–144)
Total Protein: 6.6 g/dL (ref 6.0–8.5)

## 2019-07-02 LAB — LIPID PANEL
Chol/HDL Ratio: 2.4 ratio (ref 0.0–4.4)
Cholesterol, Total: 185 mg/dL (ref 100–199)
HDL: 77 mg/dL (ref >39)
LDL Chol Calc (NIH): 96 mg/dL (ref 0–99)
Triglycerides: 61 mg/dL (ref 0–149)
VLDL Cholesterol Cal: 12 mg/dL (ref 5–40)

## 2019-07-02 LAB — VITAMIN D 25 HYDROXY (VIT D DEFICIENCY, FRACTURES): Vit D, 25-Hydroxy: 45 ng/mL (ref 30.0–100.0)

## 2019-08-03 ENCOUNTER — Ambulatory Visit (INDEPENDENT_AMBULATORY_CARE_PROVIDER_SITE_OTHER): Payer: Medicare Other | Admitting: Family Medicine

## 2019-08-03 ENCOUNTER — Telehealth: Payer: Self-pay

## 2019-08-03 ENCOUNTER — Encounter: Payer: Self-pay | Admitting: Family Medicine

## 2019-08-03 DIAGNOSIS — J069 Acute upper respiratory infection, unspecified: Secondary | ICD-10-CM | POA: Diagnosis not present

## 2019-08-03 DIAGNOSIS — J40 Bronchitis, not specified as acute or chronic: Secondary | ICD-10-CM | POA: Diagnosis not present

## 2019-08-03 MED ORDER — AZITHROMYCIN 250 MG PO TABS: ORAL_TABLET | ORAL | 0 refills | Status: AC

## 2019-08-03 NOTE — Telephone Encounter (Signed)
There are no opening today on any other provider's schedules. Please advise

## 2019-08-03 NOTE — Telephone Encounter (Signed)
Copied from [place] [id number]. Topic: General - Other >> Aug 03, 2019  8:33 AM Sheran Luz wrote: Patient would like to know if Dr. Caryn Section would work her in for appointment today. She states it is for a follow up for her bronchitis.

## 2019-08-03 NOTE — Telephone Encounter (Signed)
She can have a virtual appointment at 4:20. She cannot come into office if she has bronchitis sx.   If she has any shortness of breath then she should go to urgent care.

## 2019-08-03 NOTE — Telephone Encounter (Signed)
Patient scheduled for a virtual visit at 4:20 today. She denies SOB.

## 2019-08-03 NOTE — Progress Notes (Signed)
Patient: Anna Johnson Female    DOB: 1941/05/16   78 y.o.   MRN: [id number] Visit Date: 08/03/2019  Today's Provider: Lelon Huh, MD   Chief Complaint  Patient presents with  . URI   Subjective:    Virtual Visit via Telephone Note  I connected with Anna Johnson on 08/03/19 at  4:20 PM EST by telephone and verified that I am speaking with the correct person using two identifiers.  Location: Patient: home Provider: [facility]   I discussed the limitations, risks, security and privacy concerns of performing an evaluation and management service by telephone and the availability of in person appointments. I also discussed with the patient that there may be a patient responsible charge related to this service. The patient expressed understanding and agreed to proceed.  URI  This is a new problem. Episode onset: 5 days ago. The problem has been waxing and waning. There has been no fever. Associated symptoms include congestion, coughing and wheezing. Pertinent negatives include no ear pain, headaches, rhinorrhea, sneezing or sore throat (scratchy throat on Thursday). Treatments tried: salt water gargles, Loratadine, and Mucinex DM. The treatment provided mild relief.    Allergies  Allergen Reactions  . Codeine Other (See Comments)  . Seasonal Ic [Cholestatin]   . Covera-Hs [Verapamil] Rash     Current Outpatient Medications:  .  Acetaminophen (ACETAMINOPHEN EXTRA STRENGTH) 500 MG coapsule, Take 2 capsules by mouth as needed for fever., Disp: , Rfl:  .  amLODipine (NORVASC) 2.5 MG tablet, TAKE ONE (1) TABLET EACH DAY, Disp: 30 tablet, Rfl: 12 .  amoxicillin (AMOXIL) 500 MG capsule, Bottle, Disp: , Rfl:  .  aspirin 81 MG tablet, Take 81 mg by mouth daily., Disp: , Rfl:  .  cholecalciferol (VITAMIN D) 1000 UNITS tablet, Take 2,000 Units by mouth daily. , Disp: , Rfl:  .  Coenzyme Q10 (CO Q-10) 200 MG CAPS, Take 200 mg by mouth daily., Disp: , Rfl:  .  dorzolamide-timolol  (COSOPT) 22.3-6.8 MG/ML ophthalmic solution, , Disp: , Rfl:  .  fluticasone (FLONASE) 50 MCG/ACT nasal spray, USE TWO SPRAYS IN EACH NOSTRIL EACH DAY AS DIRECTED BY PHYSICIAN, Disp: 16 g, Rfl: 10 .  hydrochlorothiazide (HYDRODIURIL) 12.5 MG tablet, TAKE 1 TABLET BY MOUTH EVERY DAY, Disp: 90 tablet, Rfl: 4 .  latanoprost (XALATAN) 0.005 % ophthalmic solution, Place 1 drop into both eyes at bedtime., Disp: , Rfl:  .  letrozole (FEMARA) 2.5 MG tablet, Take 2.5 mg by mouth daily., Disp: , Rfl:  .  loratadine (CLARITIN) 10 MG tablet, Take 10 mg by mouth daily as needed for allergies., Disp: , Rfl:  .  lovastatin (MEVACOR) 40 MG tablet, TAKE ONE (1) TABLET AT BEDTIME, Disp: 30 tablet, Rfl: 12 .  LUMIGAN 0.01 % SOLN, , Disp: , Rfl:  .  metoprolol succinate (TOPROL-XL) 50 MG 24 hr tablet, TAKE ONE TABLET DAILY WITH ANY MEAL OR IMMEDIATELY FOLLOWING A MEAL, Disp: 90 tablet, Rfl: 4 .  quinapril (ACCUPRIL) 40 MG tablet, TAKE ONE TABLET BY MOUTH AT BEDTIME, Disp: 30 tablet, Rfl: 11 .  timolol (TIMOPTIC) 0.5 % ophthalmic solution, , Disp: , Rfl:  .  alendronate (FOSAMAX) 70 MG tablet, TAKE ONE TABLET EVERY WEEK (Patient not taking: Reported on 06/30/2019), Disp: 4 tablet, Rfl: 12  Review of Systems  Constitutional: Negative.   HENT: Positive for congestion. Negative for ear pain, rhinorrhea, sneezing and sore throat (scratchy throat on Thursday).   Respiratory: Positive for  cough and wheezing.   Cardiovascular: Negative.   Musculoskeletal: Negative.   Neurological: Negative for headaches.    Social History   Tobacco Use  . Smoking status: Never Smoker  . Smokeless tobacco: Never Used  Substance Use Topics  . Alcohol use: Yes    Alcohol/week: 7.0 - 10.0 standard drinks    Types: 7 Glasses of wine per week      Objective:   There were no vitals taken for this visit.  Physical Exam   Awake, alert, oriented x 3. In no apparent distress      Assessment & Plan     1. Upper respiratory  infection   2. Bronchitis  - azithromycin (ZITHROMAX) 250 MG tablet; 2 by mouth today, then 1 daily for 4 days  Dispense: 6 tablet; Refill: 0  Call if symptoms change or if not rapidly improving.      I discussed the assessment and treatment plan with the patient. The patient was provided an opportunity to ask questions and all were answered. The patient agreed with the plan and demonstrated an understanding of the instructions.   The patient was advised to call back or seek an in-person evaluation if the symptoms worsen or if the condition fails to improve as anticipated.  I provided 9 minutes of non-face-to-face time during this encounter.      Lelon Huh, MD  [facility]

## 2019-08-19 ENCOUNTER — Other Ambulatory Visit: Payer: Self-pay

## 2019-08-19 ENCOUNTER — Ambulatory Visit: Payer: Medicare PPO | Admitting: Podiatry

## 2019-08-19 ENCOUNTER — Encounter: Payer: Self-pay | Admitting: Podiatry

## 2019-08-19 DIAGNOSIS — M79675 Pain in left toe(s): Secondary | ICD-10-CM | POA: Diagnosis not present

## 2019-08-19 DIAGNOSIS — M79674 Pain in right toe(s): Secondary | ICD-10-CM

## 2019-08-19 DIAGNOSIS — B351 Tinea unguium: Secondary | ICD-10-CM

## 2019-08-19 DIAGNOSIS — L84 Corns and callosities: Secondary | ICD-10-CM

## 2019-08-19 NOTE — Progress Notes (Signed)
Complaint:  Visit Type: Patient returns to my office for treatment of her long thick painful nails.  She says her nails are painful walking and wearing her shoes.  This patient is unable to self treat.  Patient presents for preventative foot care services.  Patient also has developed corns both feet.   Podiatric Exam: Vascular: dorsalis pedis and posterior tibial pulses are palpable bilateral. Capillary return is immediate. Temperature gradient is WNL. Skin turgor WNL  Sensorium: Normal Semmes Weinstein monofilament test. Normal tactile sensation bilaterally. Nail Exam: Pt has thick disfigured discolored nails with subungual debris noted bilateral entire nail hallux through fifth toenails Ulcer Exam: There is no evidence of ulcer or pre-ulcerative changes or infection. Orthopedic Exam: Muscle tone and strength are WNL. No limitations in general ROM. No crepitus or effusions noted. Foot type and digits show no abnormalities. Bony prominence 2nd  MCJ right foot with mild neuritis.  Hallux limitus 1st MPJ  B/L. Skin: No Porokeratosis. No infection or ulcers.  Distal clavi 3rd left.  Corn fifth toe left foot asymptomatic.  Diagnosis:  Onychomycosis, , Pain in right toe, pain in left toes  Clavi fourth toe right foot.  Treatment & Plan Procedures and Treatment: Consent by patient was obtained for treatment procedures.   Debridement of mycotic and hypertrophic toenails, 1 through 5 bilateral and clearing of subungual debris. No ulceration, no infection noted. Debridement of corn/clavi  B/L.  Iatrogenic lesion  Which was cauterized 3rd toe right foot. Return Visit-Office Procedure: Patient instructed to return to the office for a follow up visit 10 weeks  for continued evaluation and treatment.    Gardiner Barefoot DPM

## 2019-09-02 ENCOUNTER — Encounter: Payer: Self-pay | Admitting: Podiatry

## 2019-09-02 ENCOUNTER — Ambulatory Visit: Payer: Medicare PPO | Admitting: Podiatry

## 2019-09-02 ENCOUNTER — Other Ambulatory Visit: Payer: Self-pay

## 2019-09-02 DIAGNOSIS — L089 Local infection of the skin and subcutaneous tissue, unspecified: Secondary | ICD-10-CM | POA: Diagnosis not present

## 2019-09-02 DIAGNOSIS — L98491 Non-pressure chronic ulcer of skin of other sites limited to breakdown of skin: Secondary | ICD-10-CM

## 2019-09-02 MED ORDER — CEPHALEXIN 500 MG PO CAPS: 500.0000 mg | ORAL_CAPSULE | Freq: Two times a day (BID) | ORAL | 0 refills | Status: DC

## 2019-09-02 NOTE — Patient Instructions (Signed)

## 2019-09-02 NOTE — Progress Notes (Signed)
This patient presents the office for an evaluation and treatment of her painful fourth toe right foot.  Patient states that she has developed a red swollen painful tip of the fourth toe right foot present near the third toe.  She says that she was seen here 2 weeks ago and left with no pain and discomfort.  She says that approximately 5 days ago she started developing pain and redness at the tip of the fourth toe right foot.  She says she has minimal drainage but significant redness and swelling at the inside of the fourth toe right foot.  Pain is localized at the site where the skin has broken down on the fourth toe right foot.  She says she has been wearing a bandage since the weekend.  She presents the office today for evaluation and treatment.  General Appearance  Alert, conversant and in no acute stress.  Vascular  Dorsalis pedis and posterior tibial  pulses are palpable  bilaterally.  Capillary return is within normal limits  bilaterally. Temperature is within normal limits  bilaterally.  Neurologic  Senn-Weinstein monofilament wire test within normal limits  bilaterally. Muscle power within normal limits bilaterally.  Nails Thick disfigured discolored nails with subungual debris  from hallux to fifth toes bilaterally. No evidence of bacterial infection or drainage bilaterally.  Orthopedic  No limitations of motion  feet .  No crepitus or effusions noted.  No bony pathology or digital deformities noted. Hallux limitus 1st MPJ B/L.  Bony prominence dorsum right foot.  Skin  normotropic skin with no porokeratosis noted bilaterally.  Skin lesion on the medial distal aspect of the fourth toe right foot is noted with breakdown of the skin at this site.  No evidence of drainage or pus noted today.  Patient does have a clavi noted on the third which was bleeding last visit but is asymptomatic today.  There is clavi noted on the fourth toe right foot distally with minimal amount of callus.  Infected  ulcer fourth toe right foot.  Debride necrotic tissue 4th toe right.    Neosporin/DSD.  Prescribe cephalexin.  Home soaking instructions given to patient.  Patient states that her toes were doing fine for the last 10 days and believes she aggravated the condition herself.  Discussed this condition with this patient and told her that if problem occurs to return to the office on Monday for further evaluation and treatment.   Gardiner Barefoot DPM

## 2019-09-07 ENCOUNTER — Other Ambulatory Visit: Payer: Self-pay | Admitting: Family Medicine

## 2019-09-07 DIAGNOSIS — M818 Other osteoporosis without current pathological fracture: Secondary | ICD-10-CM

## 2019-09-16 ENCOUNTER — Ambulatory Visit: Payer: Medicare PPO | Admitting: Podiatry

## 2019-09-16 ENCOUNTER — Encounter: Payer: Self-pay | Admitting: Podiatry

## 2019-09-16 ENCOUNTER — Other Ambulatory Visit: Payer: Self-pay

## 2019-09-16 DIAGNOSIS — L089 Local infection of the skin and subcutaneous tissue, unspecified: Secondary | ICD-10-CM | POA: Diagnosis not present

## 2019-09-16 DIAGNOSIS — L98491 Non-pressure chronic ulcer of skin of other sites limited to breakdown of skin: Secondary | ICD-10-CM | POA: Diagnosis not present

## 2019-09-16 DIAGNOSIS — L84 Corns and callosities: Secondary | ICD-10-CM

## 2019-09-16 MED ORDER — CEPHALEXIN 500 MG PO CAPS: 500.0000 mg | ORAL_CAPSULE | Freq: Two times a day (BID) | ORAL | 0 refills | Status: DC

## 2019-09-16 NOTE — Progress Notes (Signed)
This patient presents the office for an evaluation and treatment of her painful fourth toe right foot.  Patient states that she has been soaking and bandaging her fourth toe but the toe is still painful and bleeding.    Patient says she has been taking her antibiotics. Pain is localized at the site where the skin has broken down on the fourth toe right foot.    She presents the office today for evaluation and treatment.  General Appearance  Alert, conversant and in no acute stress.  Vascular  Dorsalis pedis and posterior tibial  pulses are palpable  bilaterally.  Capillary return is within normal limits  bilaterally. Temperature is within normal limits  bilaterally.  Neurologic  Senn-Weinstein monofilament wire test within normal limits  bilaterally. Muscle power within normal limits bilaterally.  Nails Thick disfigured discolored nails with subungual debris  from hallux to fifth toes bilaterally. No evidence of bacterial infection or drainage bilaterally.  Orthopedic  No limitations of motion  feet .  No crepitus or effusions noted.  No bony pathology or digital deformities noted. Hallux limitus 1st MPJ B/L.  Bony prominence dorsum right foot.  Skin  normotropic skin with no porokeratosis noted bilaterally.  Skin ulcer at distal aspect fourth toe right.  Reduction in redness and swelling though redness and swelling persists.  No evidence of drainage or pus noted today.    Ulcer measures 3 mm. X 3 mm.  Clean granular tissue noted at ulcer site.  Infected ulcer fourth toe right foot.  Debride necrotic tissue 4th toe right.    Neosporin/DSD.  Prescribe cephalexin.  Home soaking instructions given to patient.    Discussed this condition with this patient and told her that if problem occurs to return to the office in 10 days for further evaluation and treatment.  Toe crest pad made for this patient.  Prescribe surgical shoes for right foot.   Gardiner Barefoot DPM

## 2019-09-24 ENCOUNTER — Telehealth: Payer: Self-pay

## 2019-09-24 NOTE — Telephone Encounter (Signed)
Copied from [place] [id number]. Topic: General - Other >> Sep 24, 2019  3:48 PM Parke Poisson wrote: Reason for CRM: [facility] wants to know if you want bone density done.Last one was done Jan 2020 and insurance usually will only cover ever 2 years

## 2019-09-26 NOTE — Telephone Encounter (Signed)
She has established osteoporosis on therapy for it, and has breast cancer and taking Femara which causes bone loss. She is supposed to have BMD yearly

## 2019-09-27 ENCOUNTER — Ambulatory Visit: Payer: Medicare PPO | Admitting: Podiatry

## 2019-09-27 ENCOUNTER — Ambulatory Visit (INDEPENDENT_AMBULATORY_CARE_PROVIDER_SITE_OTHER): Payer: Medicare PPO

## 2019-09-27 ENCOUNTER — Encounter: Payer: Self-pay | Admitting: Podiatry

## 2019-09-27 ENCOUNTER — Other Ambulatory Visit: Payer: Self-pay

## 2019-09-27 ENCOUNTER — Telehealth: Payer: Self-pay | Admitting: *Deleted

## 2019-09-27 DIAGNOSIS — L97519 Non-pressure chronic ulcer of other part of right foot with unspecified severity: Secondary | ICD-10-CM | POA: Diagnosis not present

## 2019-09-27 NOTE — Telephone Encounter (Signed)
I informed the patient that I was mailing her the correct receipt for today's visit.

## 2019-09-27 NOTE — Progress Notes (Signed)
This patient returns to the office for continued evaluation of her fourth toe right foot.  Patient had developed an infected ulcer on the tip of the fourth toe right foot.  She was seen 10 days ago and was told to take antibiotics and soak foot at home.  She was also given a individualized fourth toe crest pad and a wooden shoe to walk with.  She says that she has good days and bad days when the ulcer appears to be healing.  She presents the office today for continued evaluation and treatment of her fourth toe right foot.  General Appearance  Alert, conversant and in no acute stress.  Vascular  Dorsalis pedis and posterior tibial  pulses are palpable  bilaterally.  Capillary return is within normal limits  bilaterally. Temperature is within normal limits  bilaterally.  Neurologic  Senn-Weinstein monofilament wire test within normal limits  bilaterally. Muscle power within normal limits bilaterally.  Nails Thick disfigured discolored nails with subungual debris  from hallux to fifth toes bilaterally. No evidence of bacterial infection or drainage bilaterally.  Orthopedic  No limitations of motion  feet .  No crepitus or effusions noted.  No bony pathology or digital deformities noted. Hallux limitus 1st MPJ  B/L.  Bony prominence right foot.  Skin  normotropic skin with no porokeratosis noted bilaterally.  No signs of infections or ulcers noted.  Clean granular ulcer noted on the distal aspect fourth toe right foot.  No evidence of any drainage or pus noted.  No redness to the toe but patient does have swelling through the fourth toe right foot.  Ulcer measures 5 mm x 3 mm.    Ulcer 4th toe right [id number].  Examination of the ulcer reveals minimal improvement from her previous visit.  I chose to take an x-ray due to the swelling of the fourth toe and it reveals no evidence of absence of phalanx in the fourth toe right foot.  I proceeded to give whole instructions to this patient and found that I needed to  repeat the instructions 3 times.  I told her to soak her toe in Epson salts apply Neosporin then a bandage to the toe and to different pads to help to off weight-bear the fourth toe was dispensed.  She is also to wear her surgical shoe.  She says she is not very interested in receiving care at the [facility] care center here in [place] due to a poor result of a family member.  Asked her about her HbA1C and she did not know valus.  RTC 2 weeks.  If problem emerges she is to return to the office.   Gardiner Barefoot DPM

## 2019-10-07 ENCOUNTER — Ambulatory Visit
Admission: RE | Admit: 2019-10-07 | Discharge: 2019-10-07 | Disposition: A | Discharge status: Home / Self Care | Payer: Medicare PPO | Source: Ambulatory Visit | Attending: Family Medicine | Admitting: Family Medicine

## 2019-10-07 DIAGNOSIS — M818 Other osteoporosis without current pathological fracture: Secondary | ICD-10-CM | POA: Diagnosis not present

## 2019-10-07 DIAGNOSIS — M85851 Other specified disorders of bone density and structure, right thigh: Secondary | ICD-10-CM | POA: Diagnosis not present

## 2019-10-11 ENCOUNTER — Encounter: Payer: Self-pay | Admitting: Podiatry

## 2019-10-11 ENCOUNTER — Telehealth: Payer: Self-pay

## 2019-10-11 ENCOUNTER — Other Ambulatory Visit: Payer: Self-pay

## 2019-10-11 ENCOUNTER — Ambulatory Visit: Payer: Medicare PPO | Admitting: Podiatry

## 2019-10-11 DIAGNOSIS — L84 Corns and callosities: Secondary | ICD-10-CM | POA: Diagnosis not present

## 2019-10-11 DIAGNOSIS — L97519 Non-pressure chronic ulcer of other part of right foot with unspecified severity: Secondary | ICD-10-CM | POA: Diagnosis not present

## 2019-10-11 NOTE — Progress Notes (Addendum)
This patient returns to the office for continued evaluation of her fourth toe right foot.  Patient had developed an infected ulcer on the tip of the fourth toe right foot.  Her ulcer is still open and draining.Marland Kitchen  She was previously  given a individualized fourth toe crest pad and a wooden shoe to walk with. She also had an xray taken revealing no bony changes. She says that she has good days and bad days.   The   ulcer appears to be healing despite saying she has a painful toe.  She presents the office today for continued evaluation and treatment of her fourth toe right foot.  General Appearance  Alert, conversant and in no acute stress.  Vascular  Dorsalis pedis and posterior tibial  pulses are palpable  bilaterally.  Capillary return is within normal limits  bilaterally. Temperature is within normal limits  bilaterally.  Neurologic  Senn-Weinstein monofilament wire test within normal limits  bilaterally. Muscle power within normal limits bilaterally.  Nails Thick disfigured discolored nails with subungual debris  from hallux to fifth toes bilaterally. No evidence of bacterial infection or drainage bilaterally.  Orthopedic  No limitations of motion  feet .  No crepitus or effusions noted.  No bony pathology or digital deformities noted. Hallux limitus 1st MPJ  B/L.  Bony prominence right foot.  Skin  normotropic skin with no porokeratosis noted bilaterally.  No signs of infections or ulcers noted.  Clean granular ulcer noted on the distal aspect fourth toe right foot.  No evidence of any drainage or pus noted.  No redness to the toe but patient does have swelling through the fourth toe right foot.  Ulcer measures 3 mm. X 2 mm.   Ulcer 4th toe right foot   ROV.  Examination of the ulcer reveals minimal improvement from her previous visit.   Her ulcer has decreased in size since her last visit.  I  I told her to continue  to  soak her toe in Epson salts apply Neosporin then a bandage to the toe .    She is also to wear her surgical shoe.  She has not healed after 6 weeks of care so I referred her to Dr.  Posey Pronto for a second opinion.  Appointment made for tomorrow with Dr.  Posey Pronto.     Gardiner Barefoot DPM

## 2019-10-11 NOTE — Telephone Encounter (Signed)
Patient advised.

## 2019-10-11 NOTE — Telephone Encounter (Signed)
-----   Message from Birdie Sons, MD sent at 10/10/2019  8:38 AM EST ----- Bone density shows osteopenia. Stable since last year. Repeat in 2-3 years.

## 2019-10-12 ENCOUNTER — Ambulatory Visit: Payer: Medicare PPO | Admitting: Podiatry

## 2019-10-12 DIAGNOSIS — M205X1 Other deformities of toe(s) (acquired), right foot: Secondary | ICD-10-CM | POA: Diagnosis not present

## 2019-10-12 DIAGNOSIS — L97512 Non-pressure chronic ulcer of other part of right foot with fat layer exposed: Secondary | ICD-10-CM

## 2019-10-13 ENCOUNTER — Encounter: Payer: Self-pay | Admitting: Podiatry

## 2019-10-13 NOTE — Progress Notes (Signed)
  Subjective:  Patient ID: Anna Johnson, female    DOB: July 29, 1941,  MRN: [id number]  Chief Complaint  Patient presents with  . Foot Ulcer    pt is here for an ulcer f/u of the right foot, 4th toe pt states that she is concerned that it is not getting better and is looking to get it looked at   79 y.o. female returns today for planned flexor tenotomy of the fourth digit right foot digit. Patient presents today to see me from Dr. Prudence Davidson for chronic fourth digit distal tip ulceration of the has not been healing with local wound care. Patient was well treated by Dr. Prudence Davidson and the wound itself has progressively gotten worse or has been pretty much stagnant. Patient does have a toe contracture that may be causing excessive pressure. Patient has been wearing surgical shoe which has not been helping to reduce the size of the wound. Her pain scale is 1 out of 10. She denies any other acute complaints.  Objective:  There were no vitals filed for this visit.  General AA&O x3. Normal mood and affect.  Vascular Pedal pulses palpable.  Neurologic Epicritic sensation grossly intact.  Dermatologic Pre-ulcerative callus at the tip of the right, 4th toe. Granular ulcer with periwound hyperkeratotic lesion noted. The wound measures to be 0.3 cm x 0.2 cm x 0.2 cm. The wound does not probe to bone. No clinical signs of infection noted. No purulent drainage was expressed.  Orthopedic: Semi-reducible hammertoe deformity right, 4th toe    Assessment & Plan:  Patient was evaluated and treated and all questions answered.  Hammertoe right fourth digit with with distal tip wound measuring 0.3 cm x 0.2 cm x by 0.2 cm -Flexor tenotomy as below. -Advised to remove the dressing in 24 hours and apply a band-aid and triple abx ointment every day thereafter.  Procedure: Flexor Tenotomy Indication for Procedure: toe with semi-reducible hammertoe with distal tip ulceration. Flexor tenotomy indicated to alleviate contracture,  reduce pressure, and enhance healing of the ulceration. Location: right, 4th toe Anesthesia: Lidocaine 1% plain; 1.5 mL and Marcaine 0.5% plain; 1.5 mL digital block Instrumentation: 18 g needle  Technique: The toe was anesthetized as above and prepped in the usual fashion. The toe was exsanquinated and a tourniquet was secured at the base of the toe. An 18g needle was then used to percutaneously release the flexor tendon at the plantar surface of the toe with noted release of the hammertoe deformity. The incision was then dressed with antibiotic ointment and band-aid. Compression splint dressing applied. Patient tolerated the procedure well. Dressing: Dry, sterile, compression dressing. Disposition: Patient tolerated procedure well. Patient to return in 1 week for follow-up.  Ulcer right fourth digit distal tip -Debridement as below. -Dressed with triple antibiotic and a Band-Aid, DSD. -Continue off-loading with surgical shoe.  Procedure: Excisional Debridement of Wound Rationale: Removal of non-viable soft tissue from the wound to promote healing.  Anesthesia: none Pre-Debridement Wound Measurements: 0.3 cm x 0.3 cm x 0.2 cm  Post-Debridement Wound Measurements: 0.3 cm x 0.2 cm x 0.2 cm  Type of Debridement: Sharp Excisional Tissue Removed: Non-viable soft tissue Depth of Debridement: subcutaneous tissue. Technique: Sharp excisional debridement to bleeding, viable wound base.  Dressing: Dry, sterile, compression dressing. Disposition: Patient tolerated procedure well. Patient to return in 1 week for follow-up.  No follow-ups on file.         No follow-ups on file.

## 2019-10-26 ENCOUNTER — Encounter: Payer: Self-pay | Admitting: Podiatry

## 2019-10-26 ENCOUNTER — Other Ambulatory Visit: Payer: Self-pay

## 2019-10-26 ENCOUNTER — Ambulatory Visit: Payer: Medicare PPO | Admitting: Podiatry

## 2019-10-26 VITALS — Temp 97.7°F

## 2019-10-26 DIAGNOSIS — M79675 Pain in left toe(s): Secondary | ICD-10-CM

## 2019-10-26 DIAGNOSIS — B351 Tinea unguium: Secondary | ICD-10-CM | POA: Diagnosis not present

## 2019-10-26 DIAGNOSIS — M79674 Pain in right toe(s): Secondary | ICD-10-CM | POA: Diagnosis not present

## 2019-10-26 DIAGNOSIS — M205X1 Other deformities of toe(s) (acquired), right foot: Secondary | ICD-10-CM

## 2019-10-26 DIAGNOSIS — L97512 Non-pressure chronic ulcer of other part of right foot with fat layer exposed: Secondary | ICD-10-CM

## 2019-10-27 NOTE — Progress Notes (Signed)
  Subjective:  Patient ID: Anna Johnson, female    DOB: 09-04-1940,  MRN: [id number]  Chief Complaint  Patient presents with  . Foot Ulcer    "I think it looks better, its getting smaller and its not draining now"   79 y.o. female returns for follow-up of right tenotomy of the fourth digit flexor tendon.  Patient states that she is doing well.  She states the ulcer is doing much better.  She states the tenotomy definitely helped reduce the contracture.  She denies any acute complaint.  She states that she has been doing regular dressing changes.  She does not have any clinical signs of infection.  Also patient will need to discuss various option for right fifth digit corn/callus formation from excessive pressure and shoe gears.  She states this started from shoe rubbing against the fifth toe.  She just wants to make sure that there is anything that could be done to prevent the callus from happening.  She will try some over-the-counter callus remover which she wanted to discuss if there is anything else that can be done.  Objective:   Vitals:   10/26/19 1329  Temp: 97.7 F (36.5 C)    General AA&O x3. Normal mood and affect.  Vascular Pedal pulses palpable.  Neurologic Epicritic sensation grossly intact.  Dermatologic The wound has completely reepithelialized. No clinical signs of infection noted. No purulent drainage was expressed. Nail Exam: Pt has thick disfigured discolored nails with subungual debris noted bilateral entire nail hallux through fifth toenails  Orthopedic:  Good correction and alignment of the fourth hammertoe noted.  The digit itself appears to be sitting in rectus position..  Rotation of the fifth digit noted with toe contracture noted.   Assessment & Plan:  Patient was evaluated and treated and all questions answered.  Hammertoe contracture fourth digit right side -Status post flexor tenotomy.  The contracture is now in a good alignment and correction with resolve  meant of ulceration of the distal tip  Ulcer right fourth digit distal tip -Completely resolved.  Patient can stop doing dressing changes.    Right fifth digit adductovarus deformity of noted -I explained to the patient the etiology of the rotational adductovarus of the fifth digit with relationship of continuous or excessive pressure against the shoe.  I believe patient will benefit from toe protector to help protect against the pressure associated with it.  If there is no improvement in pain, I will possibly discuss surgical intervention to realign the fifth digit.  Patient was evaluated and treated and all questions answered.  Onychomycosis with pain  -Nails palliatively debrided as below. -Educated on self-care  Procedure: Nail Debridement Rationale: pain  Type of Debridement: manual, sharp debridement. Instrumentation: Nail nipper, rotary burr. Number of Nails: 10  Procedures and Treatment: Consent by patient was obtained for treatment procedures. The patient understood the discussion of treatment and procedures well. All questions were answered thoroughly reviewed. Debridement of mycotic and hypertrophic toenails, 1 through 5 bilateral and clearing of subungual debris. No ulceration, no infection noted.  Return Visit-Office Procedure: Patient instructed to return to the office for a follow up visit 3 months for continued evaluation and treatment.  Boneta Lucks, DPM    No follow-ups on file.         No follow-ups on file.

## 2019-11-04 ENCOUNTER — Ambulatory Visit: Payer: Medicare PPO | Admitting: Podiatry

## 2019-11-25 ENCOUNTER — Other Ambulatory Visit: Payer: Self-pay | Admitting: Family Medicine

## 2019-11-25 DIAGNOSIS — I10 Essential (primary) hypertension: Secondary | ICD-10-CM

## 2019-12-22 DIAGNOSIS — H26492 Other secondary cataract, left eye: Secondary | ICD-10-CM | POA: Diagnosis not present

## 2019-12-22 DIAGNOSIS — H353131 Nonexudative age-related macular degeneration, bilateral, early dry stage: Secondary | ICD-10-CM | POA: Diagnosis not present

## 2019-12-22 DIAGNOSIS — Z961 Presence of intraocular lens: Secondary | ICD-10-CM | POA: Diagnosis not present

## 2019-12-22 DIAGNOSIS — H401131 Primary open-angle glaucoma, bilateral, mild stage: Secondary | ICD-10-CM | POA: Diagnosis not present

## 2019-12-22 DIAGNOSIS — H04123 Dry eye syndrome of bilateral lacrimal glands: Secondary | ICD-10-CM | POA: Diagnosis not present

## 2019-12-27 NOTE — Progress Notes (Signed)
Established patient visit   Patient: Anna Johnson   DOB: [birth year]   79 y.o. Female  MRN: [id number] Visit Date: 12/28/2019  Today's healthcare provider: Lelon Huh, MD   Chief Complaint  Patient presents with  . Hypertension   [employer] as a scribe for Lelon Huh, MD.,have documented all relevant documentation on the behalf of Lelon Huh, MD,as directed by  Lelon Huh, MD while in the presence of Lelon Huh, MD.  Subjective    HPI  Has appointment with [facility] for [facility] today at 10:20am.   Hypertension, follow-up  BP Readings from Last 3 Encounters:  12/28/19 136/64  12/28/19 138/64  06/30/19 130/80   Wt Readings from Last 3 Encounters:  12/28/19 150 lb (68 kg)  12/28/19 150 lb (68 kg)  06/30/19 154 lb (69.9 kg)     She was last seen for hypertension 6 months ago.  BP at that visit was 130/80. Management since that visit includes continuing same medication.  She reports good compliance with treatment. She is not having side effects.  She is following a Regular diet. She is not exercising. She does not smoke.  Use of agents associated with hypertension: NSAIDS.   Outside blood pressures are not being checked at home. Symptoms: No chest pain No chest pressure  No palpitations No syncope  No dyspnea No orthopnea  No paroxysmal nocturnal dyspnea No lower extremity edema   Pertinent labs: Lab Results  Component Value Date   CHOL 185 07/01/2019   HDL 77 07/01/2019   LDLCALC 96 07/01/2019   TRIG 61 07/01/2019   CHOLHDL 2.4 07/01/2019   Lab Results  Component Value Date   NA 137 07/01/2019   K 4.5 07/01/2019   CREATININE 0.74 07/01/2019   GFRNONAA 77 07/01/2019   GFRAA 89 07/01/2019   GLUCOSE 95 07/01/2019     The ASCVD Risk score (Goff DC Jr., et al., 2013) failed to calculate for the following reasons:   The 2013 ASCVD risk score is only valid for ages 73 to 3    ---------------------------------------------------------------------------------------------------     Medications: Outpatient Medications Prior to Visit  Medication Sig  . Acetaminophen (ACETAMINOPHEN EXTRA STRENGTH) 500 MG coapsule Take 2 capsules by mouth as needed for fever.  Marland Kitchen amLODipine (NORVASC) 2.5 MG tablet TAKE ONE (1) TABLET EACH DAY  . amoxicillin (AMOXIL) 500 MG capsule Take 2,000 mg by mouth as directed. Prior to dental procedures  . aspirin 81 MG tablet Take 81 mg by mouth daily.  . cholecalciferol (VITAMIN D) 1000 UNITS tablet Take 2,000 Units by mouth daily.   . Coenzyme Q10 (CO Q-10) 200 MG CAPS Take 200 mg by mouth daily.  . dorzolamide-timolol (COSOPT) 22.3-6.8 MG/ML ophthalmic solution   . fluticasone (FLONASE) 50 MCG/ACT nasal spray USE TWO SPRAYS IN EACH NOSTRIL EACH DAY AS DIRECTED BY PHYSICIAN (Patient taking differently: As needed)  . hydrochlorothiazide (HYDRODIURIL) 12.5 MG tablet TAKE 1 TABLET BY MOUTH EVERY DAY  . latanoprost (XALATAN) 0.005 % ophthalmic solution Place 1 drop into both eyes at bedtime.  Marland Kitchen letrozole (FEMARA) 2.5 MG tablet Take 2.5 mg by mouth daily.  Marland Kitchen loratadine (CLARITIN) 10 MG tablet Take 10 mg by mouth daily as needed for allergies.  Marland Kitchen lovastatin (MEVACOR) 40 MG tablet TAKE ONE (1) TABLET AT BEDTIME  . LUMIGAN 0.01 % SOLN Place 1 drop into both eyes at bedtime.   . metoprolol succinate (TOPROL-XL) 50 MG 24 hr tablet TAKE ONE TABLET DAILY WITH ANY MEAL  OR IMMEDIATELY FOLLOWING A MEAL  . quinapril (ACCUPRIL) 40 MG tablet TAKE 1 TABLET BY MOUTH AT BEDTIME  . timolol (TIMOPTIC) 0.5 % ophthalmic solution Place 1 drop into both eyes daily. In AM  . alendronate (FOSAMAX) 70 MG tablet TAKE ONE TABLET EVERY WEEK (Patient not taking: Reported on 12/28/2019)  . [DISCONTINUED] cephALEXin (KEFLEX) 500 MG capsule Take 1 capsule (500 mg total) by mouth 2 (two) times daily. (Patient not taking: Reported on 12/28/2019)   No facility-administered  medications prior to visit.    Review of Systems  Constitutional: Negative for appetite change, chills, fatigue and fever.  Respiratory: Negative for chest tightness and shortness of breath.   Cardiovascular: Negative for chest pain and palpitations.  Gastrointestinal: Negative for abdominal pain, nausea and vomiting.  Neurological: Negative for dizziness and weakness.      Objective    BP 136/64 (BP Location: Right Arm, Patient Position: Sitting, Cuff Size: Normal)   Pulse 68   Temp (!) 97.3 F (36.3 C) (Temporal)   Wt 150 lb (68 kg)   BMI 27.44 kg/m    Physical Exam   General appearance:  Overweight female, cooperative and in no acute distress Head: Normocephalic, without obvious abnormality, atraumatic Respiratory: Respirations even and unlabored, normal respiratory rate Extremities: All extremities are intact.  Skin: Skin color, texture, turgor normal. No rashes seen  Psych: Appropriate mood and affect. Neurologic: Mental status: Alert, oriented to person, place, and time, thought content appropriate.   No results found for any visits on 12/28/19.  Assessment & Plan     1. Essential hypertension Well controlled.  Continue current medications.   - quinapril (ACCUPRIL) 40 MG tablet; Take 1 tablet (40 mg total) by mouth at bedtime.  Dispense: 30 tablet; Refill: 12 - amLODipine (NORVASC) 2.5 MG tablet; TAKE ONE (1) TABLET EACH DAY  Dispense: 30 tablet; Refill: 12  2. Seasonal allergies Doing well, refill- fluticasone (FLONASE) 50 MCG/ACT nasal spray; USE TWO SPRAYS IN EACH NOSTRIL EACH DAY AS DIRECTED BY PHYSICIAN  Dispense: 16 g; Refill: 10  3. Hyperlipidemia, unspecified hyperlipidemia type Well controlled.  Continue current medications.   - lovastatin (MEVACOR) 40 MG tablet; TAKE ONE (1) TABLET AT BEDTIME  Dispense: 30 tablet; Refill: 12   4. Malignant neoplasm of upper-inner quadrant of left breast in female, estrogen receptor positive Malignant neoplasm of  upper-inner quadrant of left breast in female, estrogen receptor positive  Continue letrizole managed at [facility] oncology.   Return in about 6 months (around 06/29/2020).      The entirety of the information documented in the History of Present Illness, Review of Systems and Physical Exam were personally obtained by me. Portions of this information were initially documented by the CMA and reviewed by me for thoroughness and accuracy.      Lelon Huh, MD  [facility] [phone] (phone) [phone] (fax)  [place]

## 2019-12-27 NOTE — Progress Notes (Signed)
Subjective:   Anna Johnson is a 79 y.o. female who presents for Medicare Annual (Subsequent) preventive examination.  Review of Systems:  N/A  Cardiac Risk Factors include: advanced age (>46men, >22 women);dyslipidemia;hypertension     Objective:     Vitals: BP 138/64 (BP Location: Right Arm)   Pulse 68   Temp (!) 97.3 F (36.3 C) (Tympanic)   Ht 5\' 2"  (1.575 m)   Wt 150 lb (68 kg)   SpO2 96%   BMI 27.44 kg/m   Body mass index is 27.44 kg/m.  Advanced Directives 12/28/2019 12/24/2017 12/04/2016 11/23/2015 05/08/2015  Does Patient Have a Medical Advance Directive? Yes Yes No Yes Yes  Type of Paramedic of [place];Living will Healthcare Power of [place];Living will  Copy of [place] in Chart? No - copy requested No - copy requested - - -  Would patient like information on creating a medical advance directive? - - (No Data) - -    Tobacco Social History   Tobacco Use  Smoking Status Never Smoker  Smokeless Tobacco Never Used     Counseling given: Not Answered   Clinical Intake:  Pre-visit preparation completed: Yes  Pain : No/denies pain Pain Score: 0-No pain     Nutritional Status: BMI 25 -29 Overweight Nutritional Risks: None Diabetes: No  How often do you need to have someone help you when you read instructions, pamphlets, or other written materials from your doctor or pharmacy?: 1 - Never  Interpreter Needed?: No  Information entered by :: [facility], LPN  Past Medical History:  Diagnosis Date  . Allergy   . Glaucoma   . Hyperlipidemia   . Hypertension   . Osteoporosis   . Pneumonia 03/29/2015   Past Surgical History:  Procedure Laterality Date  . BREAST BIOPSY Left 03/16/2018   Affirm Biopsy- Ribbon clip- positive  . BREAST LUMPECTOMY Left 04/2018  . CATARACT EXTRACTION  2004  . DILATION AND CURETTAGE OF UTERUS    . HIP FRACTURE SURGERY   09/15/2010   left hip  . WRIST FRACTURE SURGERY     ORIF   Family History  Problem Relation Age of Onset  . Uterine cancer Mother   . Heart attack Father   . Healthy Brother   . Cancer Son        colon, died from sx  . Breast cancer Neg Hx    Social History   Socioeconomic History  . Marital status: Married    Spouse name: Chrissie Noa  . Number of children: 2  . Years of education: College  . Highest education level: Bachelor's degree (e.g., BA, AB, BS)  Occupational History  . Occupation: Retired Pharmacist, hospital  Tobacco Use  . Smoking status: Never Smoker  . Smokeless tobacco: Never Used  Substance and Sexual Activity  . Alcohol use: Yes    Alcohol/week: 7.0 - 8.0 standard drinks    Types: 7 Glasses of wine per week  . Drug use: No  . Sexual activity: Not on file  Other Topics Concern  . Not on file  Social History Narrative  . Not on file   Social Determinants of Health   Financial Resource Strain: Low Risk   . Difficulty of Paying Living Expenses: Not hard at all  Food Insecurity: No Food Insecurity  . Worried About Charity fundraiser in the Last Year: Never true  . Ran Out of Food in the  Last Year: Never true  Transportation Needs: No Transportation Needs  . Lack of Transportation (Medical): No  . Lack of Transportation (Non-Medical): No  Physical Activity: Inactive  . Days of Exercise per Week: 0 days  . Minutes of Exercise per Session: 0 min  Stress: No Stress Concern Present  . Feeling of Stress : Only a little  Social Connections: Not Isolated  . Frequency of Communication with Friends and Family: More than three times a week  . Frequency of Social Gatherings with Friends and Family: Three times a week  . Attends Religious Services: More than 4 times per year  . Active Member of Clubs or Organizations: Yes  . Attends Archivist Meetings: 1 to 4 times per year  . Marital Status: Married    Outpatient Encounter Medications as of 12/28/2019    Medication Sig  . Acetaminophen (ACETAMINOPHEN EXTRA STRENGTH) 500 MG coapsule Take 2 capsules by mouth as needed for fever.  Marland Kitchen amLODipine (NORVASC) 2.5 MG tablet TAKE ONE (1) TABLET EACH DAY  . amoxicillin (AMOXIL) 500 MG capsule Take 2,000 mg by mouth as directed. Prior to dental procedures  . cholecalciferol (VITAMIN D) 1000 UNITS tablet Take 2,000 Units by mouth daily.   . Coenzyme Q10 (CO Q-10) 200 MG CAPS Take 200 mg by mouth daily.  . fluticasone (FLONASE) 50 MCG/ACT nasal spray USE TWO SPRAYS IN EACH NOSTRIL EACH DAY AS DIRECTED BY PHYSICIAN (Patient taking differently: As needed)  . hydrochlorothiazide (HYDRODIURIL) 12.5 MG tablet TAKE 1 TABLET BY MOUTH EVERY DAY  . letrozole (FEMARA) 2.5 MG tablet Take 2.5 mg by mouth daily.  Marland Kitchen loratadine (CLARITIN) 10 MG tablet Take 10 mg by mouth daily as needed for allergies.  Marland Kitchen lovastatin (MEVACOR) 40 MG tablet TAKE ONE (1) TABLET AT BEDTIME  . LUMIGAN 0.01 % SOLN Place 1 drop into both eyes at bedtime.   . metoprolol succinate (TOPROL-XL) 50 MG 24 hr tablet TAKE ONE TABLET DAILY WITH ANY MEAL OR IMMEDIATELY FOLLOWING A MEAL  . quinapril (ACCUPRIL) 40 MG tablet TAKE 1 TABLET BY MOUTH AT BEDTIME  . timolol (TIMOPTIC) 0.5 % ophthalmic solution Place 1 drop into both eyes daily. In AM  . alendronate (FOSAMAX) 70 MG tablet TAKE ONE TABLET EVERY WEEK (Patient not taking: Reported on 12/28/2019)  . aspirin 81 MG tablet Take 81 mg by mouth daily.  . dorzolamide-timolol (COSOPT) 22.3-6.8 MG/ML ophthalmic solution   . latanoprost (XALATAN) 0.005 % ophthalmic solution Place 1 drop into both eyes at bedtime.  . [DISCONTINUED] cephALEXin (KEFLEX) 500 MG capsule Take 1 capsule (500 mg total) by mouth 2 (two) times daily. (Patient not taking: Reported on 12/28/2019)   No facility-administered encounter medications on file as of 12/28/2019.    Activities of Daily Living In your present state of health, do you have any difficulty performing the following  activities: 12/28/2019  Hearing? N  Vision? N  Difficulty concentrating or making decisions? N  Walking or climbing stairs? N  Dressing or bathing? N  Doing errands, shopping? N  Preparing Food and eating ? N  Using the Toilet? N  In the past six months, have you accidently leaked urine? N  Do you have problems with loss of bowel control? N  Managing your Medications? N  Managing your Finances? N  Housekeeping or managing your Housekeeping? N  Some recent data might be hidden    Patient Care Team: Birdie Sons, MD as PCP - General (Family Medicine) Warden Fillers, MD as  Consulting Physician (Ophthalmology) Chelsea Aus, DDS as Consulting Physician (Dentistry) Clyde Canterbury, MD as Referring Physician (Otolaryngology) Gardiner Barefoot, DPM as Consulting Physician (Podiatry) Danella Sensing, MD as Consulting Physician (Dermatology)    Assessment:   This is a routine wellness examination for Elisabetta.  Exercise Activities and Dietary recommendations Current Exercise Habits: The patient does not participate in regular exercise at present, Exercise limited by: None identified  Goals    . DIET - INCREASE WATER INTAKE     Recommend increasing water intake to 4-6 glasses a day.     . Exercise     Recommend increasing exercise/activity. Pt to start walking 2 days a week for 45 minutes.        Fall Risk: Fall Risk  12/28/2019 08/03/2019 12/24/2017 12/04/2016 11/23/2015  Falls in the past year? 0 0 No No No  Number falls in past yr: 0 0 - - -  Injury with Fall? 0 0 - - -    FALL RISK PREVENTION PERTAINING TO THE HOME:  Any stairs in or around the home? Yes  If so, are there any without handrails? No   Home free of loose throw rugs in walkways, pet beds, electrical cords, etc? Yes  Adequate lighting in your home to reduce risk of falls? Yes   ASSISTIVE DEVICES UTILIZED TO PREVENT FALLS:  Life alert? No  Use of a cane, walker or w/c? No  Grab bars in the bathroom? No  Shower  chair or bench in shower? No  Elevated toilet seat or a handicapped toilet? Yes    TIMED UP AND GO:  Was the test performed? No .    Depression Screen PHQ 2/9 Scores 12/28/2019 12/30/2018 12/24/2017 12/24/2017  PHQ - 2 Score 0 0 0 0  PHQ- 9 Score - - 1 -     Cognitive Function     6CIT Screen 12/28/2019 12/24/2017 12/04/2016  What Year? 0 points 0 points 0 points  What month? 0 points 0 points 0 points  What time? 0 points 0 points 0 points  Count back from 20 0 points 0 points 0 points  Months in reverse 0 points 0 points 0 points  Repeat phrase 0 points 0 points 0 points  Total Score 0 0 0    Immunization History  Administered Date(s) Administered  . Fluad Quad(high Dose 65+) 05/26/2019  . Influenza Split 04/29/2011, 05/04/2012  . Influenza, High Dose Seasonal PF 05/08/2015, 05/30/2016, 06/10/2017, 07/01/2018  . Influenza,inj,Quad PF,6+ Mos 05/03/2013, 04/27/2014  . PFIZER SARS-COV-2 Vaccination 09/17/2019, 10/08/2019  . Pneumococcal Conjugate-13 11/03/2013  . Pneumococcal Polysaccharide-23 06/04/2008, 07/01/2018  . Td 04/13/2008  . Tdap 04/29/2011  . Zoster 04/13/2008    Qualifies for Shingles Vaccine? Yes  Zostavax completed 04/13/08. Due for Shingrix. Pt has been advised to call insurance company to determine out of pocket expense. Advised may also receive vaccine at local pharmacy or Health Dept. Verbalized acceptance and understanding.  Tdap: Up to date  Flu Vaccine: Up to date  Pneumococcal Vaccine: Completed series  Screening Tests Health Maintenance  Topic Date Due  . INFLUENZA VACCINE  03/12/2020  . TETANUS/TDAP  04/28/2021  . DEXA SCAN  10/06/2022  . COVID-19 Vaccine  Completed  . PNA vac Low Risk Adult  Completed    Cancer Screenings:  Colorectal Screening: No longer required.   Mammogram: No longer required.   Bone Density: Completed 10/07/19. Results reflect OSTEOPENIA. Repeat every 3 years.   Lung Cancer Screening: (Low Dose CT Chest  recommended  if Age 12-80 years, 11 pack-year currently smoking OR have quit w/in 15years.) does not qualify.   Additional Screening:  Vision Screening: Recommended annual ophthalmology exams for early detection of glaucoma and other disorders of the eye.  Dental Screening: Recommended annual dental exams for proper oral hygiene  Community Resource Referral:  CRR required this visit? No     Plan:  I have personally reviewed and addressed the Medicare Annual Wellness questionnaire and have noted the following in the patient's chart:  A. Medical and social history B. Use of alcohol, tobacco or illicit drugs  C. Current medications and supplements D. Functional ability and status E.  Nutritional status F.  Physical activity G. Advance directives H. List of other physicians I.  Hospitalizations, surgeries, and ER visits in previous 12 months J.  [place] such as hearing and vision if needed, cognitive and depression L. Referrals and appointments   In addition, I have reviewed and discussed with patient certain preventive protocols, quality metrics, and best practice recommendations. A written personalized care plan for preventive services as well as general preventive health recommendations were provided to patient. Nurse Health Advisor  Signed,    Sandria Mcenroe [place], [place]  [id number] Nurse Health Advisor   Nurse Notes: None.

## 2019-12-28 ENCOUNTER — Ambulatory Visit (INDEPENDENT_AMBULATORY_CARE_PROVIDER_SITE_OTHER): Payer: Medicare PPO | Admitting: Family Medicine

## 2019-12-28 ENCOUNTER — Encounter: Payer: Self-pay | Admitting: Family Medicine

## 2019-12-28 ENCOUNTER — Other Ambulatory Visit: Payer: Self-pay

## 2019-12-28 ENCOUNTER — Ambulatory Visit (INDEPENDENT_AMBULATORY_CARE_PROVIDER_SITE_OTHER): Payer: Medicare PPO

## 2019-12-28 VITALS — BP 138/64 | HR 68 | Temp 97.3°F | Ht 62.0 in | Wt 150.0 lb

## 2019-12-28 VITALS — BP 136/64 | HR 68 | Temp 97.3°F | Wt 150.0 lb

## 2019-12-28 DIAGNOSIS — C50212 Malignant neoplasm of 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: Secondary | ICD-10-CM | POA: Diagnosis not present

## 2019-12-28 DIAGNOSIS — E785 Hyperlipidemia, unspecified: Secondary | ICD-10-CM

## 2019-12-28 DIAGNOSIS — J302 Other seasonal allergic rhinitis: Secondary | ICD-10-CM

## 2019-12-28 DIAGNOSIS — Z0000 Encounter for general adult medical examination without abnormal findings: Secondary | ICD-10-CM | POA: Diagnosis not present

## 2019-12-28 DIAGNOSIS — I10 Essential (primary) hypertension: Secondary | ICD-10-CM

## 2019-12-28 MED ORDER — LOVASTATIN 40 MG PO TABS: ORAL_TABLET | ORAL | 12 refills | Status: DC

## 2019-12-28 MED ORDER — AMLODIPINE BESYLATE 2.5 MG PO TABS: ORAL_TABLET | ORAL | 12 refills | Status: DC

## 2019-12-28 MED ORDER — FLUTICASONE PROPIONATE 50 MCG/ACT NA SUSP: NASAL | 10 refills | Status: DC

## 2019-12-28 MED ORDER — QUINAPRIL HCL 40 MG PO TABS: 40.0000 mg | ORAL_TABLET | Freq: Every day | ORAL | 12 refills | Status: DC

## 2019-12-28 NOTE — Patient Instructions (Signed)
.   Please review the attached list of medications and notify my office if there are any errors.   . Please bring all of your medications to every appointment so we can make sure that our medication list is the same as yours.   

## 2019-12-28 NOTE — Patient Instructions (Signed)
Anna Johnson , Thank you for taking time to come for your Medicare Wellness Visit. I appreciate your ongoing commitment to your health goals. Please review the following plan we discussed and let me know if I can assist you in the future.   Screening recommendations/referrals: Colonoscopy: No longer required.  Mammogram: No longer required.  Bone Density: Up to date, due 10/06/22 Recommended yearly ophthalmology/optometry visit for glaucoma screening and checkup Recommended yearly dental visit for hygiene and checkup  Vaccinations: Influenza vaccine: Up to date Pneumococcal vaccine: Completed series Tdap vaccine: Up to date, due 04/2021 Shingles vaccine: Pt declines today.     Advanced directives: Please bring a copy of your POA (Power of Attorney) and/or Living Will to your next appointment.   Conditions/risks identified: Recommend to continue to increase water intake to 6-8 8 oz glasses a day. Also recommend to try and walk 2 days a week for at least 45 minutes at a time.   Next appointment: 11:00 AM with Dr Caryn Section. Declined scheduling an AWV for 2022 at this time.    Preventive Care 48 Years and Older, Female Preventive care refers to lifestyle choices and visits with your health care provider that can promote health and wellness. What does preventive care include?  A yearly physical exam. This is also called an annual well check.  Dental exams once or twice a year.  Routine eye exams. Ask your health care provider how often you should have your eyes checked.  Personal lifestyle choices, including:  Daily care of your teeth and gums.  Regular physical activity.  Eating a healthy diet.  Avoiding tobacco and drug use.  Limiting alcohol use.  Practicing safe sex.  Taking low-dose aspirin every day.  Taking vitamin and mineral supplements as recommended by your health care provider. What happens during an annual well check? The services and screenings done by your  health care provider during your annual well check will depend on your age, overall health, lifestyle risk factors, and family history of disease. Counseling  Your health care provider may ask you questions about your:  Alcohol use.  Tobacco use.  Drug use.  Emotional well-being.  Home and relationship well-being.  Sexual activity.  Eating habits.  History of falls.  Memory and ability to understand (cognition).  Work and work Statistician.  Reproductive health. Screening  You may have the following tests or measurements:  Height, weight, and BMI.  Blood pressure.  Lipid and cholesterol levels. These may be checked every 5 years, or more frequently if you are over 82 years old.  Skin check.  Lung cancer screening. You may have this screening every year starting at age 54 if you have a 30-pack-year history of smoking and currently smoke or have quit within the past 15 years.  Fecal occult blood test (FOBT) of the stool. You may have this test every year starting at age 37.  Flexible sigmoidoscopy or colonoscopy. You may have a sigmoidoscopy every 5 years or a colonoscopy every 10 years starting at age 63.  Hepatitis C blood test.  Hepatitis B blood test.  Sexually transmitted disease (STD) testing.  Diabetes screening. This is done by checking your blood sugar (glucose) after you have not eaten for a while (fasting). You may have this done every 1-3 years.  Bone density scan. This is done to screen for osteoporosis. You may have this done starting at age 16.  Mammogram. This may be done every 1-2 years. Talk to your health care provider  about how often you should have regular mammograms. Talk with your health care provider about your test results, treatment options, and if necessary, the need for more tests. Vaccines  Your health care provider may recommend certain vaccines, such as:  Influenza vaccine. This is recommended every year.  Tetanus, diphtheria, and  acellular pertussis (Tdap, Td) vaccine. You may need a Td booster every 10 years.  Zoster vaccine. You may need this after age 25.  Pneumococcal 13-valent conjugate (PCV13) vaccine. One dose is recommended after age 40.  Pneumococcal polysaccharide (PPSV23) vaccine. One dose is recommended after age 28. Talk to your health care provider about which screenings and vaccines you need and how often you need them. This information is not intended to replace advice given to you by your health care provider. Make sure you discuss any questions you have with your health care provider. Document Released: 08/25/2015 Document Revised: 04/17/2016 Document Reviewed: 05/30/2015 Elsevier Interactive Patient Education  2017 [place] Prevention in the Home Falls can cause injuries. They can happen to people of all ages. There are many things you can do to make your home safe and to help prevent falls. What can I do on the outside of my home?  Regularly fix the edges of walkways and driveways and fix any cracks.  Remove anything that might make you trip as you walk through a door, such as a raised step or threshold.  Trim any bushes or trees on the path to your home.  Use bright outdoor lighting.  Clear any walking paths of anything that might make someone trip, such as rocks or tools.  Regularly check to see if handrails are loose or broken. Make sure that both sides of any steps have handrails.  Any raised decks and porches should have guardrails on the edges.  Have any leaves, snow, or ice cleared regularly.  Use sand or salt on walking paths during winter.  Clean up any spills in your garage right away. This includes oil or grease spills. What can I do in the bathroom?  Use night lights.  Install grab bars by the toilet and in the tub and shower. Do not use towel bars as grab bars.  Use non-skid mats or decals in the tub or shower.  If you need to sit down in the shower, use  a plastic, non-slip stool.  Keep the floor dry. Clean up any water that spills on the floor as soon as it happens.  Remove soap buildup in the tub or shower regularly.  Attach bath mats securely with double-sided non-slip rug tape.  Do not have throw rugs and other things on the floor that can make you trip. What can I do in the bedroom?  Use night lights.  Make sure that you have a light by your bed that is easy to reach.  Do not use any sheets or blankets that are too big for your bed. They should not hang down onto the floor.  Have a firm chair that has side arms. You can use this for support while you get dressed.  Do not have throw rugs and other things on the floor that can make you trip. What can I do in the kitchen?  Clean up any spills right away.  Avoid walking on wet floors.  Keep items that you use a lot in easy-to-reach places.  If you need to reach something above you, use a strong step stool that has a grab bar.  Keep electrical cords out of the way.  Do not use floor polish or wax that makes floors slippery. If you must use wax, use non-skid floor wax.  Do not have throw rugs and other things on the floor that can make you trip. What can I do with my stairs?  Do not leave any items on the stairs.  Make sure that there are handrails on both sides of the stairs and use them. Fix handrails that are broken or loose. Make sure that handrails are as long as the stairways.  Check any carpeting to make sure that it is firmly attached to the stairs. Fix any carpet that is loose or worn.  Avoid having throw rugs at the top or bottom of the stairs. If you do have throw rugs, attach them to the floor with carpet tape.  Make sure that you have a light switch at the top of the stairs and the bottom of the stairs. If you do not have them, ask someone to add them for you. What else can I do to help prevent falls?  Wear shoes that:  Do not have high heels.  Have  rubber bottoms.  Are comfortable and fit you well.  Are closed at the toe. Do not wear sandals.  If you use a stepladder:  Make sure that it is fully opened. Do not climb a closed stepladder.  Make sure that both sides of the stepladder are locked into place.  Ask someone to hold it for you, if possible.  Clearly mark and make sure that you can see:  Any grab bars or handrails.  First and last steps.  Where the edge of each step is.  Use tools that help you move around (mobility aids) if they are needed. These include:  Canes.  Walkers.  Scooters.  Crutches.  Turn on the lights when you go into a dark area. Replace any light bulbs as soon as they burn out.  Set up your furniture so you have a clear path. Avoid moving your furniture around.  If any of your floors are uneven, fix them.  If there are any pets around you, be aware of where they are.  Review your medicines with your doctor. Some medicines can make you feel dizzy. This can increase your chance of falling. Ask your doctor what other things that you can do to help prevent falls. This information is not intended to replace advice given to you by your health care provider. Make sure you discuss any questions you have with your health care provider. Document Released: 05/25/2009 Document Revised: 01/04/2016 Document Reviewed: 09/02/2014 Elsevier Interactive Patient Education  2017 [employer].

## 2020-01-27 ENCOUNTER — Other Ambulatory Visit: Payer: Self-pay

## 2020-01-27 ENCOUNTER — Encounter: Payer: Self-pay | Admitting: Podiatry

## 2020-01-27 ENCOUNTER — Ambulatory Visit: Payer: Medicare PPO | Admitting: Podiatry

## 2020-01-27 DIAGNOSIS — B351 Tinea unguium: Secondary | ICD-10-CM

## 2020-01-27 DIAGNOSIS — M79675 Pain in left toe(s): Secondary | ICD-10-CM | POA: Diagnosis not present

## 2020-01-27 DIAGNOSIS — M79674 Pain in right toe(s): Secondary | ICD-10-CM

## 2020-01-27 NOTE — Progress Notes (Signed)
This patient returns to the office for evaluation and treatment of long thick painful nails .  This patient is unable to trim her own nails since the patient cannot reach her feet.  Patient says the nails are painful walking and wearing her shoes.  She  returns for preventive foot care services.  General Appearance  Alert, conversant and in no acute stress.  Vascular  Dorsalis pedis and posterior tibial  pulses are palpable  bilaterally.  Capillary return is within normal limits  bilaterally. Temperature is within normal limits  bilaterally.  Neurologic  Senn-Weinstein monofilament wire test within normal limits  bilaterally. Muscle power within normal limits bilaterally.  Nails Thick disfigured discolored nails with subungual debris  from hallux to fifth toes bilaterally. No evidence of bacterial infection or drainage bilaterally.  Orthopedic  No limitations of motion  feet .  No crepitus or effusions noted.   Fourth toe right is straightened.  Skin  normotropic skin with no porokeratosis noted bilaterally.  No signs of infections or ulcers noted.     Onychomycosis  Pain in toes right foot  Pain in toes left foot  Debridement  of nails  1-5  B/L with a nail nipper.  Nails were then filed using a dremel tool with no incidents.    RTC 3 months   Gardiner Barefoot DPM

## 2020-02-29 DIAGNOSIS — Z170 Estrogen receptor positive status [ER+]: Secondary | ICD-10-CM | POA: Diagnosis not present

## 2020-02-29 DIAGNOSIS — C50212 Malignant neoplasm of 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: Secondary | ICD-10-CM | POA: Diagnosis not present

## 2020-03-13 DIAGNOSIS — Z9189 Other specified personal risk factors, not elsewhere classified: Secondary | ICD-10-CM | POA: Diagnosis not present

## 2020-03-13 DIAGNOSIS — M549 Dorsalgia, unspecified: Secondary | ICD-10-CM | POA: Diagnosis not present

## 2020-03-13 DIAGNOSIS — I10 Essential (primary) hypertension: Secondary | ICD-10-CM | POA: Diagnosis not present

## 2020-03-13 DIAGNOSIS — M8000XD Age-related osteoporosis with current pathological fracture, unspecified site, subsequent encounter for fracture with routine healing: Secondary | ICD-10-CM | POA: Diagnosis not present

## 2020-03-13 DIAGNOSIS — Z170 Estrogen receptor positive status [ER+]: Secondary | ICD-10-CM | POA: Diagnosis not present

## 2020-03-13 DIAGNOSIS — Z79811 Long term (current) use of aromatase inhibitors: Secondary | ICD-10-CM | POA: Diagnosis not present

## 2020-03-13 DIAGNOSIS — C50212 Malignant neoplasm of 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: Secondary | ICD-10-CM | POA: Diagnosis not present

## 2020-03-13 DIAGNOSIS — Z5181 Encounter for therapeutic drug level monitoring: Secondary | ICD-10-CM | POA: Diagnosis not present

## 2020-03-13 DIAGNOSIS — N6489 Other specified disorders of breast: Secondary | ICD-10-CM | POA: Diagnosis not present

## 2020-05-01 ENCOUNTER — Encounter: Payer: Self-pay | Admitting: Podiatry

## 2020-05-01 ENCOUNTER — Other Ambulatory Visit: Payer: Self-pay

## 2020-05-01 ENCOUNTER — Ambulatory Visit: Payer: Medicare PPO | Admitting: Podiatry

## 2020-05-01 DIAGNOSIS — L84 Corns and callosities: Secondary | ICD-10-CM

## 2020-05-01 DIAGNOSIS — M79674 Pain in right toe(s): Secondary | ICD-10-CM

## 2020-05-01 DIAGNOSIS — L089 Local infection of the skin and subcutaneous tissue, unspecified: Secondary | ICD-10-CM

## 2020-05-01 DIAGNOSIS — M79675 Pain in left toe(s): Secondary | ICD-10-CM | POA: Diagnosis not present

## 2020-05-01 DIAGNOSIS — L98491 Non-pressure chronic ulcer of skin of other sites limited to breakdown of skin: Secondary | ICD-10-CM

## 2020-05-01 DIAGNOSIS — B351 Tinea unguium: Secondary | ICD-10-CM | POA: Diagnosis not present

## 2020-05-01 NOTE — Progress Notes (Signed)
This patient returns to the office for evaluation and treatment of long thick painful nails .  This patient is unable to trim her own nails since the patient cannot reach her feet.  Patient says the nails are painful walking and wearing her shoes.  She  returns for preventive foot care services. She says she has developed a callus on the tip of her third toe left foot..  Patient says there is no pain associated with this skin lesion.  General Appearance  Alert, conversant and in no acute stress.  Vascular  Dorsalis pedis and posterior tibial  pulses are palpable  bilaterally.  Capillary return is within normal limits  bilaterally. Temperature is within normal limits  bilaterally.  Neurologic  Senn-Weinstein monofilament wire test within normal limits  bilaterally. Muscle power within normal limits bilaterally.  Nails Thick disfigured discolored nails with subungual debris  from hallux to fifth toes bilaterally. No evidence of bacterial infection or drainage bilaterally.  Orthopedic  No limitations of motion  feet .  No crepitus or effusions noted.   Fourth toe right is straightened.  Contracted third digit left foot with medial deviation.  Skin  normotropic skin with no porokeratosis noted bilaterally.  No signs of infections or ulcers noted.   Patient has thick calluses skin developing on the distal lateral aspect third toe left foot.  No redness or swelling noted.  Onychomycosis  Pain in toes right foot  Pain in toes left foot  Skin lesion third toe left foot.  Debridement  of nails  1-5  B/L with a nail nipper.  Nails were then filed using a dremel tool with no incidents.  The skin lesion was debrided with # 15 blade followed by dremel usage.  This newly developed skin lesion has excessive callus tissue on tip of third toe.  No infection noted at this toe.  Patient previously had flexor tenotomy performed on fourth toe right foot.  This skin lesion appears to have characteristics of a wart with  pinpoint bleeding.  This lesion was debrided with # 15 blade followed by neosporin/DSD.  Since there is no pain associated with this skin lesion,I will allow this lesion to declare itself.  Patient was given home bandaging instructions with neosporin/DSD.  She will return to the office if this lesion enlarges or worsens.  RTC 3 months for preventative foot care services.   Gardiner Barefoot DPM

## 2020-06-19 NOTE — Progress Notes (Signed)
Established patient visit   Patient: Anna Johnson   DOB: [birth year]   79 y.o. Female  MRN: [id number] Visit Date: 06/20/2020  Today's healthcare provider: Lelon Huh, MD   Chief Complaint  Patient presents with  . Hyperlipidemia  . Hypertension   Subjective    HPI  Hypertension, follow-up  BP Readings from Last 3 Encounters:  06/20/20 138/75  12/28/19 136/64  12/28/19 138/64   Wt Readings from Last 3 Encounters:  06/20/20 139 lb 12.8 oz (63.4 kg)  12/28/19 150 lb (68 kg)  12/28/19 150 lb (68 kg)     She was last seen for hypertension 12/28/2019.  BP at that visit was 136/64. Management since that visit includes continuing same medications.  She reports good compliance with treatment. She is not having side effects.  She is following a Regular diet. She is not exercising. She does not smoke.  Use of agents associated with hypertension: NSAIDS.   Outside blood pressures are not checked. Symptoms: No chest pain No chest pressure  No palpitations No syncope  No dyspnea No orthopnea  No paroxysmal nocturnal dyspnea Yes lower extremity edema   Pertinent labs: Lab Results  Component Value Date   CHOL 185 07/01/2019   HDL 77 07/01/2019   LDLCALC 96 07/01/2019   TRIG 61 07/01/2019   CHOLHDL 2.4 07/01/2019   Lab Results  Component Value Date   NA 137 07/01/2019   K 4.5 07/01/2019   CREATININE 0.74 07/01/2019   GFRNONAA 77 07/01/2019   GFRAA 89 07/01/2019   GLUCOSE 95 07/01/2019     The ASCVD Risk score ([place] Jr., et al., 2013) failed to calculate for the following reasons:   The 2013 ASCVD risk score is only valid for ages 56 to 98   ---------------------------------------------------------------------------------------------------  Lipid/Cholesterol, Follow-up  Last lipid panel Other pertinent labs  Lab Results  Component Value Date   CHOL 185 07/01/2019   HDL 77 07/01/2019   LDLCALC 96 07/01/2019   TRIG 61 07/01/2019   CHOLHDL 2.4  07/01/2019   Lab Results  Component Value Date   ALT 11 07/01/2019   AST 18 07/01/2019   PLT 246 06/11/2017   TSH 1.900 11/24/2015     She was last seen for this 12/28/2019.  Management since that visit includes continue same mediation.  She reports good compliance with treatment. She is not having side effects.   Symptoms: No chest pain No chest pressure/discomfort  No dyspnea No lower extremity edema  No numbness or tingling of extremity No orthopnea  No palpitations No paroxysmal nocturnal dyspnea  No speech difficulty No syncope   Current diet: well balanced Current exercise: none  The ASCVD Risk score ([place]., et al., 2013) failed to calculate for the following reasons:   The 2013 ASCVD risk score is only valid for ages 30 to 65  ---------------------------------------------------------------------------------------------------  Follow up for Vitamin D Deficiency:  The patient was last seen for this 1 year ago. Changes made at last visit include none.  She reports good compliance with treatment. She feels that condition is Unchanged. She is not having side effects.  She has been off alendronate for the last year and her BMD January of this year shows only osteopenia.   -----------------------------------------------------------------------------------------     Medications: Outpatient Medications Prior to Visit  Medication Sig  . Acetaminophen (ACETAMINOPHEN EXTRA STRENGTH) 500 MG coapsule Take 2 capsules by mouth as needed for fever.  Marland Kitchen alendronate (FOSAMAX) 70 MG  tablet TAKE ONE TABLET EVERY WEEK  . amLODipine (NORVASC) 2.5 MG tablet TAKE ONE (1) TABLET EACH DAY  . amoxicillin (AMOXIL) 500 MG capsule Take 2,000 mg by mouth as directed. Prior to dental procedures  . aspirin 81 MG tablet Take 81 mg by mouth daily.  . cholecalciferol (VITAMIN D) 1000 UNITS tablet Take 2,000 Units by mouth daily.   . Coenzyme Q10 (CO Q-10) 200 MG CAPS Take 200 mg by mouth  daily.  . dorzolamide-timolol (COSOPT) 22.3-6.8 MG/ML ophthalmic solution   . fluticasone (FLONASE) 50 MCG/ACT nasal spray USE TWO SPRAYS IN EACH NOSTRIL EACH DAY AS DIRECTED BY PHYSICIAN  . hydrochlorothiazide (HYDRODIURIL) 12.5 MG tablet TAKE 1 TABLET BY MOUTH EVERY DAY  . latanoprost (XALATAN) 0.005 % ophthalmic solution Place 1 drop into both eyes at bedtime.  Marland Kitchen letrozole (FEMARA) 2.5 MG tablet Take 2.5 mg by mouth daily.  Marland Kitchen loratadine (CLARITIN) 10 MG tablet Take 10 mg by mouth daily as needed for allergies.  Marland Kitchen lovastatin (MEVACOR) 40 MG tablet TAKE ONE (1) TABLET AT BEDTIME  . LUMIGAN 0.01 % SOLN Place 1 drop into both eyes at bedtime.   . metoprolol succinate (TOPROL-XL) 50 MG 24 hr tablet TAKE ONE TABLET DAILY WITH ANY MEAL OR IMMEDIATELY FOLLOWING A MEAL  . quinapril (ACCUPRIL) 40 MG tablet Take 1 tablet (40 mg total) by mouth at bedtime.  . timolol (TIMOPTIC) 0.5 % ophthalmic solution Apply to eye.   No facility-administered medications prior to visit.    Review of Systems  Constitutional: Negative for appetite change, chills, fatigue and fever.  Respiratory: Negative for chest tightness and shortness of breath.   Cardiovascular: Negative for chest pain and palpitations.  Gastrointestinal: Negative for abdominal pain, nausea and vomiting.  Neurological: Negative for dizziness and weakness.      Objective    BP 138/75 (BP Location: Right Arm, Patient Position: Sitting, Cuff Size: Normal)   Pulse 83   Temp 98.4 F (36.9 C) (Oral)   Resp 16   Wt 139 lb 12.8 oz (63.4 kg)   BMI 25.57 kg/m    Physical Exam   General: Appearance:     Well developed, well nourished female in no acute distress  Eyes:    PERRL, conjunctiva/corneas clear, EOM's intact       Lungs:     Clear to auscultation bilaterally, respirations unlabored  Heart:    Normal heart rate. Normal rhythm. No murmurs, rubs, or gallops.   MS:   All extremities are intact.   Neurologic:   Awake, alert, oriented  x 3. No apparent focal neurological           defect.         Assessment & Plan     1. Primary hypertension Well controlled.  Continue current medications.   - hydrochlorothiazide (HYDRODIURIL) 12.5 MG tablet; TAKE 1 TABLET BY MOUTH EVERY DAY  Dispense: 90 tablet; Refill: 4 - metoprolol succinate (TOPROL-XL) 50 MG 24 hr tablet; TAKE ONE TABLET DAILY WITH ANY MEAL OR IMMEDIATELY FOLLOWING A MEAL  Dispense: 90 tablet; Refill: 4  2. Vitamin D deficiency Doing well with vitamin D supplementation. Off alendronate at this time - VITAMIN D 25 Hydroxy (Vit-D Deficiency, Fractures)  3. Hyperlipidemia, unspecified hyperlipidemia type She is tolerating lovastatin well with no adverse effects.   - CBC - Comprehensive metabolic panel - Lipid panel  4. Need for influenza vaccination  - Flu Vaccine QUAD High Dose IM (Fluad)    Encourage regular exercise  The entirety of the information documented in the History of Present Illness, Review of Systems and Physical Exam were personally obtained by me. Portions of this information were initially documented by the CMA and reviewed by me for thoroughness and accuracy.      Lelon Huh, MD  [facility] [phone] (phone) [phone] (fax)  [place]

## 2020-06-20 ENCOUNTER — Other Ambulatory Visit: Payer: Self-pay

## 2020-06-20 ENCOUNTER — Encounter: Payer: Self-pay | Admitting: Family Medicine

## 2020-06-20 ENCOUNTER — Ambulatory Visit (INDEPENDENT_AMBULATORY_CARE_PROVIDER_SITE_OTHER): Payer: Medicare PPO | Admitting: Family Medicine

## 2020-06-20 VITALS — BP 138/75 | HR 83 | Temp 98.4°F | Resp 16 | Wt 139.8 lb

## 2020-06-20 DIAGNOSIS — I10 Essential (primary) hypertension: Secondary | ICD-10-CM

## 2020-06-20 DIAGNOSIS — Z23 Encounter for immunization: Secondary | ICD-10-CM

## 2020-06-20 DIAGNOSIS — E785 Hyperlipidemia, unspecified: Secondary | ICD-10-CM | POA: Diagnosis not present

## 2020-06-20 DIAGNOSIS — E559 Vitamin D deficiency, unspecified: Secondary | ICD-10-CM | POA: Diagnosis not present

## 2020-06-20 MED ORDER — METOPROLOL SUCCINATE ER 50 MG PO TB24: ORAL_TABLET | ORAL | 4 refills | Status: DC

## 2020-06-20 MED ORDER — HYDROCHLOROTHIAZIDE 12.5 MG PO TABS: ORAL_TABLET | ORAL | 4 refills | Status: DC

## 2020-06-20 NOTE — Patient Instructions (Signed)
.   Please review the attached list of medications and notify my office if there are any errors.   . Please bring all of your medications to every appointment so we can make sure that our medication list is the same as yours.   . Please go to the lab draw station in [address] on the second floor of [facility]  when you are fasting for 8 hours. Normal hours are 8:00am to 11:30am and 1:00pm to 4:00pm Monday through Friday   

## 2020-06-21 DIAGNOSIS — E785 Hyperlipidemia, unspecified: Secondary | ICD-10-CM | POA: Diagnosis not present

## 2020-06-21 DIAGNOSIS — E559 Vitamin D deficiency, unspecified: Secondary | ICD-10-CM | POA: Diagnosis not present

## 2020-06-22 DIAGNOSIS — H353131 Nonexudative age-related macular degeneration, bilateral, early dry stage: Secondary | ICD-10-CM | POA: Diagnosis not present

## 2020-06-22 DIAGNOSIS — H401131 Primary open-angle glaucoma, bilateral, mild stage: Secondary | ICD-10-CM | POA: Diagnosis not present

## 2020-06-22 LAB — CBC
Hematocrit: 38.1 % (ref 34.0–46.6)
Hemoglobin: 12.9 g/dL (ref 11.1–15.9)
MCH: 32.5 pg (ref 26.6–33.0)
MCHC: 33.9 g/dL (ref 31.5–35.7)
MCV: 96 fL (ref 79–97)
Platelets: 288 10*3/uL (ref 150–450)
RBC: 3.97 x10E6/uL (ref 3.77–5.28)
RDW: 11.7 % (ref 11.7–15.4)
WBC: 5.6 10*3/uL (ref 3.4–10.8)

## 2020-06-22 LAB — LIPID PANEL
Chol/HDL Ratio: 2.2 ratio (ref 0.0–4.4)
Cholesterol, Total: 180 mg/dL (ref 100–199)
HDL: 82 mg/dL (ref >39)
LDL Chol Calc (NIH): 89 mg/dL (ref 0–99)
Triglycerides: 46 mg/dL (ref 0–149)
VLDL Cholesterol Cal: 9 mg/dL (ref 5–40)

## 2020-06-22 LAB — COMPREHENSIVE METABOLIC PANEL
ALT: 11 IU/L (ref 0–32)
AST: 16 IU/L (ref 0–40)
Albumin/Globulin Ratio: 1.6 (ref 1.2–2.2)
Albumin: 3.9 g/dL (ref 3.7–4.7)
Alkaline Phosphatase: 48 IU/L (ref 44–121)
BUN/Creatinine Ratio: 22 (ref 12–28)
BUN: 18 mg/dL (ref 8–27)
Bilirubin Total: 0.5 mg/dL (ref 0.0–1.2)
CO2: 26 mmol/L (ref 20–29)
Calcium: 9.5 mg/dL (ref 8.7–10.3)
Chloride: 97 mmol/L (ref 96–106)
Creatinine, Ser: 0.82 mg/dL (ref 0.57–1.00)
GFR calc Af Amer: 78 mL/min/{1.73_m2} (ref >59)
GFR calc non Af Amer: 68 mL/min/{1.73_m2} (ref >59)
Globulin, Total: 2.5 g/dL (ref 1.5–4.5)
Glucose: 94 mg/dL (ref 65–99)
Potassium: 4.3 mmol/L (ref 3.5–5.2)
Sodium: 135 mmol/L (ref 134–144)
Total Protein: 6.4 g/dL (ref 6.0–8.5)

## 2020-06-22 LAB — VITAMIN D 25 HYDROXY (VIT D DEFICIENCY, FRACTURES): Vit D, 25-Hydroxy: 51.1 ng/mL (ref 30.0–100.0)

## 2020-06-26 DIAGNOSIS — Z85828 Personal history of other malignant neoplasm of skin: Secondary | ICD-10-CM | POA: Diagnosis not present

## 2020-06-26 DIAGNOSIS — L570 Actinic keratosis: Secondary | ICD-10-CM | POA: Diagnosis not present

## 2020-06-26 DIAGNOSIS — L821 Other seborrheic keratosis: Secondary | ICD-10-CM | POA: Diagnosis not present

## 2020-07-19 ENCOUNTER — Other Ambulatory Visit: Payer: Self-pay

## 2020-07-19 ENCOUNTER — Ambulatory Visit (INDEPENDENT_AMBULATORY_CARE_PROVIDER_SITE_OTHER): Payer: Medicare PPO

## 2020-07-19 DIAGNOSIS — Z23 Encounter for immunization: Secondary | ICD-10-CM | POA: Diagnosis not present

## 2020-08-14 ENCOUNTER — Encounter: Payer: Self-pay | Admitting: Podiatry

## 2020-08-14 ENCOUNTER — Ambulatory Visit: Payer: Medicare PPO | Admitting: Podiatry

## 2020-08-14 ENCOUNTER — Other Ambulatory Visit: Payer: Self-pay

## 2020-08-14 DIAGNOSIS — M79675 Pain in left toe(s): Secondary | ICD-10-CM | POA: Diagnosis not present

## 2020-08-14 DIAGNOSIS — L84 Corns and callosities: Secondary | ICD-10-CM

## 2020-08-14 DIAGNOSIS — M79674 Pain in right toe(s): Secondary | ICD-10-CM

## 2020-08-14 DIAGNOSIS — B351 Tinea unguium: Secondary | ICD-10-CM

## 2020-08-14 NOTE — Progress Notes (Signed)
This patient returns to the office for evaluation and treatment of long thick painful nails .  This patient is unable to trim her own nails since the patient cannot reach her feet.  Patient says the nails are painful walking and wearing her shoes.  She  returns for preventive foot care services. She says she has developed a callus on the tip of her third toe left foot..    General Appearance  Alert, conversant and in no acute stress.  Vascular  Dorsalis pedis and posterior tibial  pulses are not  palpable  bilaterally.  Capillary return is within normal limits  bilaterally. Temperature is within normal limits  bilaterally.  Neurologic  Senn-Weinstein monofilament wire test within normal limits  bilaterally. Muscle power within normal limits bilaterally.  Nails Thick disfigured discolored nails with subungual debris  from hallux to fifth toes bilaterally. No evidence of bacterial infection or drainage bilaterally.  Orthopedic  No limitations of motion  feet .  No crepitus or effusions noted.   Fourth toe right is straightened.  Contracted third digit left foot with medial deviation.  Midfoot  DJD  B/L.  Skin  normotropic skin with no porokeratosis noted bilaterally.  No signs of infections or ulcers noted.   Patient has thick calluses skin developing on the distal lateral aspect third toe left foot.  No redness or swelling noted. Asymptomatic  Clavi 3rd toe right foot.  Onychomycosis  Pain in toes right foot  Pain in toes left foot  Skin lesion third toe left foot.  Debridement  of nails  1-5  B/L with a nail nipper.  Nails were then filed using a dremel tool with no incidents.  The skin lesion was debrided with # 15 blade followed by dremel usage on the tip third toe left foot.   Helane Gunther DPM

## 2020-09-26 DIAGNOSIS — Z85828 Personal history of other malignant neoplasm of skin: Secondary | ICD-10-CM | POA: Diagnosis not present

## 2020-09-26 DIAGNOSIS — L218 Other seborrheic dermatitis: Secondary | ICD-10-CM | POA: Diagnosis not present

## 2020-11-13 ENCOUNTER — Ambulatory Visit: Payer: Medicare PPO | Admitting: Podiatry

## 2020-11-13 ENCOUNTER — Other Ambulatory Visit: Payer: Self-pay

## 2020-11-13 ENCOUNTER — Encounter: Payer: Self-pay | Admitting: Podiatry

## 2020-11-13 DIAGNOSIS — B351 Tinea unguium: Secondary | ICD-10-CM | POA: Diagnosis not present

## 2020-11-13 DIAGNOSIS — M79675 Pain in left toe(s): Secondary | ICD-10-CM | POA: Diagnosis not present

## 2020-11-13 DIAGNOSIS — M205X1 Other deformities of toe(s) (acquired), right foot: Secondary | ICD-10-CM

## 2020-11-13 DIAGNOSIS — L84 Corns and callosities: Secondary | ICD-10-CM

## 2020-11-13 DIAGNOSIS — M79674 Pain in right toe(s): Secondary | ICD-10-CM

## 2020-11-13 NOTE — Progress Notes (Signed)
This patient returns to the office for evaluation and treatment of long thick painful nails .  This patient is unable to trim her own nails since the patient cannot reach her feet.  Patient says the nails are painful walking and wearing her shoes.  She  returns for preventive foot care services. She says she has developed a callus on the tip of her third toe left foot..    General Appearance  Alert, conversant and in no acute stress.  Vascular  Dorsalis pedis and posterior tibial  pulses are not  palpable  bilaterally.  Capillary return is within normal limits  bilaterally. Temperature is within normal limits  bilaterally.  Neurologic  Senn-Weinstein monofilament wire test within normal limits  bilaterally. Muscle power within normal limits bilaterally.  Nails Thick disfigured discolored nails with subungual debris  from hallux to fifth toes bilaterally. No evidence of bacterial infection or drainage bilaterally.  Orthopedic  No limitations of motion  feet .  No crepitus or effusions noted.   Fourth toe right is straightened.  Contracted third digit left foot with medial deviation.  Midfoot  DJD  B/L.  Skin  normotropic skin with no porokeratosis noted bilaterally.  No signs of infections or ulcers noted.   Patient has clavi 3rd toe left with pinhole sized ulcer which is healing.  Onychomycosis  Pain in toes right foot  Pain in toes left foot  Skin lesion third toe left foot.  Debridement  of nails  1-5  B/L with a nail nipper.  Nails were then filed using a dremel tool with no incidents.  Neosporin/DSD 3rd left.  Padding dispensed.  RTC 3 months    Gardiner Barefoot DPM

## 2020-12-29 ENCOUNTER — Other Ambulatory Visit: Payer: Self-pay | Admitting: Family Medicine

## 2020-12-29 DIAGNOSIS — I10 Essential (primary) hypertension: Secondary | ICD-10-CM

## 2020-12-29 NOTE — Telephone Encounter (Signed)
Requested medications are due for refill today.  yes  Requested medications are on the active medications list.  yes  Last refill. 12/28/2019  Future visit scheduled.   Yes  Notes to clinic.  Prescription is expired.

## 2021-01-01 ENCOUNTER — Other Ambulatory Visit: Payer: Self-pay | Admitting: Family Medicine

## 2021-01-01 DIAGNOSIS — E785 Hyperlipidemia, unspecified: Secondary | ICD-10-CM

## 2021-01-01 DIAGNOSIS — I10 Essential (primary) hypertension: Secondary | ICD-10-CM

## 2021-01-01 DIAGNOSIS — J302 Other seasonal allergic rhinitis: Secondary | ICD-10-CM

## 2021-01-01 NOTE — Telephone Encounter (Signed)
Medication: quinapril (ACCUPRIL) 40 MG tablet [[id number]] , lovastatin (MEVACOR) 40 MG tablet [[id number]] , fluticasone (FLONASE) 50 MCG/ACT nasal spray [[id number]]   Has the patient contacted their pharmacy? YES  (Agent: If no, request that the patient contact the pharmacy for the refill.) (Agent: If yes, when and what did the pharmacy advise?)  Preferred Pharmacy (with phone number or street name): [facility] [address] [place], [place] - [place] [address] Phone: [phone] Fax: [phone] Hours: Not open 24 hours    Agent: Please be advised that RX refills may take up to 3 business days. We ask that you follow-up with your pharmacy.

## 2021-01-03 ENCOUNTER — Encounter: Payer: Medicare PPO | Admitting: Family Medicine

## 2021-01-03 MED ORDER — FLUTICASONE PROPIONATE 50 MCG/ACT NA SUSP: NASAL | 5 refills | Status: AC

## 2021-01-03 MED ORDER — QUINAPRIL HCL 40 MG PO TABS
40.0000 mg | ORAL_TABLET | Freq: Every day | ORAL | 1 refills | Status: DC
Start: 2021-01-03 — End: 2021-02-28

## 2021-01-03 MED ORDER — LOVASTATIN 40 MG PO TABS
ORAL_TABLET | ORAL | 1 refills | Status: DC
Start: 2021-01-03 — End: 2021-02-28

## 2021-02-22 DIAGNOSIS — H26492 Other secondary cataract, left eye: Secondary | ICD-10-CM | POA: Diagnosis not present

## 2021-02-22 DIAGNOSIS — Z961 Presence of intraocular lens: Secondary | ICD-10-CM | POA: Diagnosis not present

## 2021-02-22 DIAGNOSIS — H353131 Nonexudative age-related macular degeneration, bilateral, early dry stage: Secondary | ICD-10-CM | POA: Diagnosis not present

## 2021-02-22 DIAGNOSIS — H401131 Primary open-angle glaucoma, bilateral, mild stage: Secondary | ICD-10-CM | POA: Diagnosis not present

## 2021-02-22 DIAGNOSIS — H04123 Dry eye syndrome of bilateral lacrimal glands: Secondary | ICD-10-CM | POA: Diagnosis not present

## 2021-02-28 ENCOUNTER — Other Ambulatory Visit: Payer: Self-pay

## 2021-02-28 ENCOUNTER — Ambulatory Visit (INDEPENDENT_AMBULATORY_CARE_PROVIDER_SITE_OTHER): Payer: Medicare PPO | Admitting: Family Medicine

## 2021-02-28 ENCOUNTER — Encounter: Payer: Self-pay | Admitting: Family Medicine

## 2021-02-28 VITALS — BP 138/58 | HR 71 | Wt 141.0 lb

## 2021-02-28 DIAGNOSIS — M19012 Primary osteoarthritis, left shoulder: Secondary | ICD-10-CM | POA: Diagnosis not present

## 2021-02-28 DIAGNOSIS — M545 Low back pain, unspecified: Secondary | ICD-10-CM

## 2021-02-28 DIAGNOSIS — M19011 Primary osteoarthritis, right shoulder: Secondary | ICD-10-CM | POA: Diagnosis not present

## 2021-02-28 DIAGNOSIS — I10 Essential (primary) hypertension: Secondary | ICD-10-CM | POA: Diagnosis not present

## 2021-02-28 DIAGNOSIS — G8929 Other chronic pain: Secondary | ICD-10-CM

## 2021-02-28 DIAGNOSIS — E785 Hyperlipidemia, unspecified: Secondary | ICD-10-CM

## 2021-02-28 MED ORDER — QUINAPRIL HCL 40 MG PO TABS: 40.0000 mg | ORAL_TABLET | Freq: Every day | ORAL | 4 refills | Status: DC

## 2021-02-28 MED ORDER — LOVASTATIN 40 MG PO TABS: ORAL_TABLET | ORAL | 4 refills | Status: DC

## 2021-02-28 NOTE — Patient Instructions (Signed)
.   Please review the attached list of medications and notify my office if there are any errors.   . Please bring all of your medications to every appointment so we can make sure that our medication list is the same as yours.    The CDC recommends two doses of Shingrix (the shingles vaccine) separated by 2 to 6 months for adults age 81 years and older. I recommend checking with your pharmacy plan regarding coverage for this vaccine.     

## 2021-02-28 NOTE — Progress Notes (Signed)
Established patient visit   Patient: Anna Johnson   DOB: 04-01-1941   80 y.o. Female  MRN: [id number] Visit Date: 02/28/2021  Today's healthcare provider: Lelon Huh, MD   Chief Complaint  Patient presents with   Hypertension   Subjective    Back Pain This is a chronic problem. The problem has been waxing and waning since onset. The pain is present in the lumbar spine. Pertinent negatives include no chest pain, headaches, numbness or weakness. Treatments tried: Massage and rest. The treatment provided moderate relief.   Hypertension, follow-up  BP Readings from Last 3 Encounters:  02/28/21 (!) 138/58  06/20/20 138/75  12/28/19 136/64   Wt Readings from Last 3 Encounters:  02/28/21 141 lb (64 kg)  06/20/20 139 lb 12.8 oz (63.4 kg)  12/28/19 150 lb (68 kg)     She was last seen for hypertension 8 months ago.  BP at that visit was 138/75. Management since that visit includes continue same medication.  She reports excellent compliance with treatment. She is not having side effects.  She is following a Regular diet. She is not exercising. She does not smoke.  Use of agents associated with hypertension: NSAIDS.   Outside blood pressures are not being checked. Symptoms: No chest pain No chest pressure  No palpitations No syncope  No dyspnea No orthopnea  No paroxysmal nocturnal dyspnea Yes lower extremity edema   Pertinent labs: Lab Results  Component Value Date   CHOL 180 06/21/2020   HDL 82 06/21/2020   LDLCALC 89 06/21/2020   TRIG 46 06/21/2020   CHOLHDL 2.2 06/21/2020   Lab Results  Component Value Date   NA 135 06/21/2020   K 4.3 06/21/2020   CREATININE 0.82 06/21/2020   GFRNONAA 68 06/21/2020   GFRAA 78 06/21/2020   GLUCOSE 94 06/21/2020     The ASCVD Risk score (Goff DC Jr., et al., 2013) failed to calculate for the following reasons:   The 2013 ASCVD risk score is only valid for ages 16 to 31    ---------------------------------------------------------------------------------------------------      Medications: Outpatient Medications Prior to Visit  Medication Sig   Acetaminophen 500 MG capsule Take 2 capsules by mouth as needed for fever.   amLODipine (NORVASC) 2.5 MG tablet TAKE 1 TABLET BY MOUTH DAILY   amoxicillin (AMOXIL) 500 MG capsule Take 2,000 mg by mouth as directed. Prior to dental procedures   aspirin 81 MG tablet Take 81 mg by mouth daily.   cholecalciferol (VITAMIN D) 1000 UNITS tablet Take 2,000 Units by mouth daily.    Coenzyme Q10 (CO Q-10) 200 MG CAPS Take 200 mg by mouth daily.   dorzolamide-timolol (COSOPT) 22.3-6.8 MG/ML ophthalmic solution    fluticasone (FLONASE) 50 MCG/ACT nasal spray USE TWO SPRAYS IN EACH NOSTRIL EACH DAY AS DIRECTED BY PHYSICIAN   hydrochlorothiazide (HYDRODIURIL) 12.5 MG tablet TAKE 1 TABLET BY MOUTH EVERY DAY   latanoprost (XALATAN) 0.005 % ophthalmic solution Place 1 drop into both eyes at bedtime.   letrozole (FEMARA) 2.5 MG tablet Take 2.5 mg by mouth daily.   loratadine (CLARITIN) 10 MG tablet Take 10 mg by mouth daily as needed for allergies.   lovastatin (MEVACOR) 40 MG tablet TAKE ONE (1) TABLET AT BEDTIME   LUMIGAN 0.01 % SOLN Place 1 drop into both eyes at bedtime.    metoprolol succinate (TOPROL-XL) 50 MG 24 hr tablet TAKE ONE TABLET DAILY WITH ANY MEAL OR IMMEDIATELY FOLLOWING A MEAL  quinapril (ACCUPRIL) 40 MG tablet Take 1 tablet (40 mg total) by mouth at bedtime.   timolol (TIMOPTIC) 0.5 % ophthalmic solution Apply to eye.   No facility-administered medications prior to visit.    Review of Systems  Constitutional: Negative.   Respiratory: Negative.    Cardiovascular:  Positive for leg swelling. Negative for chest pain and palpitations.  Gastrointestinal: Negative.   Musculoskeletal:  Positive for arthralgias and back pain. Negative for gait problem, joint swelling, myalgias, neck pain and neck stiffness.   Neurological:  Negative for dizziness, syncope, speech difficulty, weakness, light-headedness, numbness and headaches.      Objective    BP (!) 138/58 (BP Location: Right Arm, Patient Position: Sitting, Cuff Size: Large)   Pulse 71   Wt 141 lb (64 kg)   SpO2 99%   BMI 25.79 kg/m     Physical Exam   General: Appearance:     Well developed, well nourished female in no acute distress  Eyes:    PERRL, conjunctiva/corneas clear, EOM's intact       Lungs:     Clear to auscultation bilaterally, respirations unlabored  Heart:    Normal heart rate. Normal rhythm. No murmurs, rubs, or gallops.    MS:   All extremities are intact.    Neurologic:   Awake, alert, oriented x 3. No apparent focal neurological defect.         Assessment & Plan     1. Hyperlipidemia, unspecified hyperlipidemia type Well controlled.  Continue current medications.  refill lovastatin (MEVACOR) 40 MG tablet; TAKE ONE (1) TABLET AT BEDTIME  Dispense: 90 tablet; Refill: 4  2. Essential hypertension Well controlled.  Continue current medications.  refill quinapril (ACCUPRIL) 40 MG tablet; Take 1 tablet (40 mg total) by mouth at bedtime.  Dispense: 90 tablet; Refill: 4  3. Osteoarthritis of both shoulders, unspecified osteoarthritis type OTC tylenol per label. Consider orthopedic referral if it becomes limiting to her activity.   4. Chronic midline low back pain without sciatica As above.    Future Appointments  Date Time Provider [place]  03/01/2021 10:00 AM Gardiner Barefoot, DPM [facility]  06/27/2021 10:00 AM Caryn Section Kirstie Peri, MD [facility]         The entirety of the information documented in the History of Present Illness, Review of Systems and Physical Exam were personally obtained by me. Portions of this information were initially documented by the CMA and reviewed by me for thoroughness and accuracy.     Lelon Huh, MD  [facility] [phone]  (phone) [phone] (fax)  [place]

## 2021-03-01 ENCOUNTER — Ambulatory Visit: Payer: Medicare PPO | Admitting: Podiatry

## 2021-03-01 ENCOUNTER — Encounter: Payer: Self-pay | Admitting: Podiatry

## 2021-03-01 DIAGNOSIS — M205X1 Other deformities of toe(s) (acquired), right foot: Secondary | ICD-10-CM

## 2021-03-01 DIAGNOSIS — M79675 Pain in left toe(s): Secondary | ICD-10-CM | POA: Diagnosis not present

## 2021-03-01 DIAGNOSIS — L84 Corns and callosities: Secondary | ICD-10-CM

## 2021-03-01 DIAGNOSIS — B351 Tinea unguium: Secondary | ICD-10-CM

## 2021-03-01 DIAGNOSIS — M79674 Pain in right toe(s): Secondary | ICD-10-CM | POA: Diagnosis not present

## 2021-03-01 NOTE — Progress Notes (Signed)
This patient returns to the office for evaluation and treatment of long thick painful nails .  This patient is unable to trim her own nails since the patient cannot reach her feet.  Patient says the nails are painful walking and wearing her shoes.  She  returns for preventive foot care services. She says she has developed a callus on the tip of her third toe left foot..    General Appearance  Alert, conversant and in no acute stress.  Vascular  Dorsalis pedis and posterior tibial  pulses are not  palpable  bilaterally.  Capillary return is within normal limits  bilaterally. Temperature is within normal limits  bilaterally.  Neurologic  Senn-Weinstein monofilament wire test within normal limits  bilaterally. Muscle power within normal limits bilaterally.  Nails Thick disfigured discolored nails with subungual debris  from hallux to fifth toes bilaterally. No evidence of bacterial infection or drainage bilaterally.  Orthopedic  No limitations of motion  feet .  No crepitus or effusions noted.   Fourth toe right is straightened.  Contracted third digit left foot with medial deviation.  Midfoot  DJD  B/L.  Skin  normotropic skin with no porokeratosis noted bilaterally.  No signs of infections or ulcers noted.   Clavi 3rd toe left foot.  Onychomycosis  Pain in toes right foot  Pain in toes left foot  Skin lesion third toe left foot.  Debridement  of nails  1-5  B/L with a nail nipper.  Nails were then filed using a dremel tool with no incidents.  Debride clavi with # 15 blade.  To see Dr.  Posey Pronto for possible surgery third toe left.  Padding dispensed.  RTC 3 months    Gardiner Barefoot DPM

## 2021-03-05 ENCOUNTER — Ambulatory Visit: Payer: Medicare PPO | Admitting: Podiatry

## 2021-03-19 DIAGNOSIS — M8000XD Age-related osteoporosis with current pathological fracture, unspecified site, subsequent encounter for fracture with routine healing: Secondary | ICD-10-CM | POA: Diagnosis not present

## 2021-03-19 DIAGNOSIS — Z79811 Long term (current) use of aromatase inhibitors: Secondary | ICD-10-CM | POA: Diagnosis not present

## 2021-03-19 DIAGNOSIS — R928 Other abnormal and inconclusive findings on diagnostic imaging of breast: Secondary | ICD-10-CM | POA: Diagnosis not present

## 2021-03-19 DIAGNOSIS — C50212 Malignant neoplasm of 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: Secondary | ICD-10-CM | POA: Diagnosis not present

## 2021-03-19 DIAGNOSIS — Z9189 Other specified personal risk factors, not elsewhere classified: Secondary | ICD-10-CM | POA: Diagnosis not present

## 2021-03-19 DIAGNOSIS — Z170 Estrogen receptor positive status [ER+]: Secondary | ICD-10-CM | POA: Diagnosis not present

## 2021-04-26 ENCOUNTER — Other Ambulatory Visit: Payer: Self-pay

## 2021-04-26 ENCOUNTER — Ambulatory Visit: Payer: Medicare PPO | Admitting: Podiatry

## 2021-04-26 DIAGNOSIS — M205X2 Other deformities of toe(s) (acquired), left foot: Secondary | ICD-10-CM | POA: Diagnosis not present

## 2021-04-26 NOTE — Progress Notes (Signed)
Subjective:  Patient ID: Anna Johnson, female    DOB: 1941-01-07,  MRN: [id number]  Chief Complaint  Patient presents with   Toe Pain    Left foot 3rd toe    80 y.o. female presents with the above complaint.  Patient presents with a new complaint left third digit toe contracture.  Patient states is painful to walk and has progressed to gotten worse there is a callus formation at the distal tip of the toe.  She states that she had a flexor tenotomy done on the right side which seemed to help a lot and straighten out the toe and take the pressure away.  She would like to know if she could get that done on for the third digit of the left side.  She has failed all conservative treatment options include debridement offloading padding protecting if she would like to have surgical tenotomy performed.  However she will be on her foot a lot over another appointment this upcoming Monday we will plan on that and over the next few weeks.  Patient states understanding   Review of Systems: Negative except as noted in the HPI. Denies N/V/F/Ch.  Past Medical History:  Diagnosis Date   Allergy    Glaucoma    Hyperlipidemia    Hypertension    Osteoporosis    Pneumonia 03/29/2015    Current Outpatient Medications:    Acetaminophen 500 MG capsule, Take 2 capsules by mouth as needed for fever., Disp: , Rfl:    amLODipine (NORVASC) 2.5 MG tablet, TAKE 1 TABLET BY MOUTH DAILY, Disp: 30 tablet, Rfl: 12   amoxicillin (AMOXIL) 500 MG capsule, Take 2,000 mg by mouth as directed. Prior to dental procedures, Disp: , Rfl:    aspirin 81 MG tablet, Take 81 mg by mouth daily., Disp: , Rfl:    cholecalciferol (VITAMIN D) 1000 UNITS tablet, Take 2,000 Units by mouth daily. , Disp: , Rfl:    Coenzyme Q10 (CO Q-10) 200 MG CAPS, Take 200 mg by mouth daily., Disp: , Rfl:    dorzolamide-timolol (COSOPT) 22.3-6.8 MG/ML ophthalmic solution, , Disp: , Rfl:    fluticasone (FLONASE) 50 MCG/ACT nasal spray, USE TWO SPRAYS IN  EACH NOSTRIL EACH DAY AS DIRECTED BY PHYSICIAN, Disp: 16 g, Rfl: 5   hydrochlorothiazide (HYDRODIURIL) 12.5 MG tablet, TAKE 1 TABLET BY MOUTH EVERY DAY, Disp: 90 tablet, Rfl: 4   latanoprost (XALATAN) 0.005 % ophthalmic solution, Place 1 drop into both eyes at bedtime., Disp: , Rfl:    letrozole (FEMARA) 2.5 MG tablet, Take 2.5 mg by mouth daily., Disp: , Rfl:    loratadine (CLARITIN) 10 MG tablet, Take 10 mg by mouth daily as needed for allergies., Disp: , Rfl:    lovastatin (MEVACOR) 40 MG tablet, TAKE ONE (1) TABLET AT BEDTIME, Disp: 90 tablet, Rfl: 4   LUMIGAN 0.01 % SOLN, Place 1 drop into both eyes at bedtime. , Disp: , Rfl:    metoprolol succinate (TOPROL-XL) 50 MG 24 hr tablet, TAKE ONE TABLET DAILY WITH ANY MEAL OR IMMEDIATELY FOLLOWING A MEAL, Disp: 90 tablet, Rfl: 4   quinapril (ACCUPRIL) 40 MG tablet, Take 1 tablet (40 mg total) by mouth at bedtime., Disp: 90 tablet, Rfl: 4   timolol (TIMOPTIC) 0.5 % ophthalmic solution, Apply to eye., Disp: , Rfl:   Social History   Tobacco Use  Smoking Status Never  Smokeless Tobacco Never    Allergies  Allergen Reactions   Codeine Other (See Comments)   Seasonal Ic [  Cholestatin]    Covera-Hs [Verapamil] Rash   Objective:  There were no vitals filed for this visit. There is no height or weight on file to calculate BMI. Constitutional Well developed. Well nourished.  Vascular Dorsalis pedis pulses palpable bilaterally. Posterior tibial pulses palpable bilaterally. Capillary refill normal to all digits.  No cyanosis or clubbing noted. Pedal hair growth normal.  Neurologic Normal speech. Oriented to person, place, and time. Epicritic sensation to light touch grossly present bilaterally.  Dermatologic Nails well groomed and normal in appearance. No open wounds. No skin lesions.  Orthopedic: Left third digit toe contracture with hammering noted.  Semiflexible in nature.  Pain on palpation to the distal tip.  No ulceration noted.    Radiographs: None Assessment:   1. Toe contracture, left    Plan:  Patient was evaluated and treated and all questions answered.  Left third digit toe contracture -I explained to the patient the etiology of toe contracture and various treatment options were extensively discussed.  Given the amount of pain that she is experiencing in the setting of hyperkeratotic lesion I believe patient will benefit from a flexor tenotomy in the future.  I will hold off for now as patient has a dental appointment planned that requires a lot of walking at [facility].  I discussed with her that we will see her back after the appointment and we will plan on doing a flexor tenotomy.  She states understanding.  No follow-ups on file.

## 2021-05-03 ENCOUNTER — Other Ambulatory Visit: Payer: Self-pay

## 2021-05-03 ENCOUNTER — Ambulatory Visit: Payer: Medicare PPO | Admitting: Podiatry

## 2021-05-03 ENCOUNTER — Encounter: Payer: Self-pay | Admitting: Podiatry

## 2021-05-03 DIAGNOSIS — M205X2 Other deformities of toe(s) (acquired), left foot: Secondary | ICD-10-CM

## 2021-05-03 NOTE — Progress Notes (Signed)
  Subjective:  Patient ID: Anna Johnson, female    DOB: 21-May-1941,  MRN: [id number]  Chief Complaint  Patient presents with   Hammer Toe    "He's going to straightening out this third toe."   80 y.o. female returns today for planned flexor tenotomy of the Left rhid digit digit.  Objective:  There were no vitals filed for this visit.  General AA&O x3. Normal mood and affect.  Vascular Pedal pulses palpable.  Neurologic Epicritic sensation grossly intact.  Dermatologic Pre-ulcerative callus at the tip of the left, 3rd toe  Orthopedic: Semi-reducible hammertoe deformity left, 3rd toe    Assessment & Plan:  Patient was evaluated and treated and all questions answered.  Hammertoe third left with pre-ulcerative callus -Flexor tenotomy as below. -Advised to remove the dressing in 24 hours and apply a band-aid and triple abx ointment every day thereafter.  Procedure: Flexor Tenotomy Indication for Procedure: toe with semi-reducible hammertoe with distal tip ulceration. Flexor tenotomy indicated to alleviate contracture, reduce pressure, and enhance healing of the ulceration. Location: left, 3rd toe Anesthesia: Lidocaine 1% plain; 1.5 mL and Marcaine 0.5% plain; 1.5 mL digital block Instrumentation: #11 handle Technique: The toe was anesthetized as above and prepped in the usual fashion. The toe was exsanquinated and a tourniquet was secured at the base of the toe. An 18g needle was then used to percutaneously release the flexor tendon at the plantar surface of the toe with noted release of the hammertoe deformity.  3-0 Prolene was used to close incision.  The incision was then dressed with antibiotic ointment and band-aid. Compression splint dressing applied. Patient tolerated the procedure well. Dressing: Dry, sterile, compression dressing. Disposition: Patient tolerated procedure well. Patient to return in 1 week for follow-up.      No follow-ups on file.

## 2021-05-22 ENCOUNTER — Ambulatory Visit: Payer: Medicare PPO | Admitting: Podiatry

## 2021-05-24 ENCOUNTER — Ambulatory Visit: Payer: Medicare PPO | Admitting: Podiatry

## 2021-05-24 ENCOUNTER — Other Ambulatory Visit: Payer: Self-pay

## 2021-05-24 DIAGNOSIS — M205X2 Other deformities of toe(s) (acquired), left foot: Secondary | ICD-10-CM

## 2021-05-29 NOTE — Progress Notes (Signed)
  Subjective:  Patient ID: Anna Johnson, female    DOB: [birth year],  MRN: [id number]  Chief Complaint  Patient presents with   Toe Pain    Left foot 3rd toe    80 y.o. female returns today for follow-up of flexor tenotomy of left third digit.  Patient states she is doing a lot better.  She is here to have her stitches taken out.  She denies any other acute complaints.  She is happy with the procedure her pain has gone down considerably  Objective:  There were no vitals filed for this visit.  General AA&O x3. Normal mood and affect.  Vascular Pedal pulses palpable.  Neurologic Epicritic sensation grossly intact.  Dermatologic No further pre-ulcerative callus at the tip of the left, 3rd toe  Orthopedic: Rectus alignment of the hammertoe contracture of left third digit noted.   Assessment & Plan:  Patient was evaluated and treated and all questions answered.  Hammertoe third left with pre-ulcerative callus -Clinically healed.  Third digit is now more in rectus position and the ulceration/callus has completely healed.  At this time I discussed with the patient importance of shoe gear modification if any foot and ankle issues arise in future I have asked her to come see me.  She states understanding      No follow-ups on file.

## 2021-06-04 ENCOUNTER — Ambulatory Visit: Payer: Medicare PPO | Admitting: Podiatry

## 2021-06-26 NOTE — Progress Notes (Signed)
Complete Physical Exam      Patient: Anna Johnson, Female    DOB: November 01, 1940, 80 y.o.   MRN: [id number] Visit Date: 06/27/2021  Today's Provider: Lelon Huh, MD   Chief Complaint  Patient presents with   Medicare Wellness   Hypertension   Hyperlipidemia   Subjective    Anna Johnson is a 80 y.o. female who presents today for her complete physical examination,  She reports consuming a general diet. The patient does not participate in regular exercise at present. She generally feels fairly well. She reports sleeping fairly well. She does not have additional problems to discuss today.   HPI Hypertension, follow-up  BP Readings from Last 3 Encounters:  06/27/21 (!) 151/62  02/28/21 (!) 138/58  06/20/20 138/75   Wt Readings from Last 3 Encounters:  06/27/21 134 lb (60.8 kg)  02/28/21 141 lb (64 kg)  06/20/20 139 lb 12.8 oz (63.4 kg)     She was last seen for hypertension 3 months ago.  BP at that visit was 138/58. Management since that visit includes continue same medication.  She reports good compliance with treatment. She is not having side effects.  She is following a Regular diet. She is not exercising. She does not smoke.  Use of agents associated with hypertension: NSAIDS.   Outside blood pressures are not checked. Symptoms: No chest pain No chest pressure  No palpitations No syncope  No dyspnea No orthopnea  No paroxysmal nocturnal dyspnea No lower extremity edema   Pertinent labs: Lab Results  Component Value Date   CHOL 180 06/21/2020   HDL 82 06/21/2020   LDLCALC 89 06/21/2020   TRIG 46 06/21/2020   CHOLHDL 2.2 06/21/2020   Lab Results  Component Value Date   NA 135 06/21/2020   K 4.3 06/21/2020   CREATININE 0.82 06/21/2020   GFRNONAA 68 06/21/2020   GLUCOSE 94 06/21/2020   TSH 1.900 11/24/2015     The ASCVD Risk score (Arnett DK, et al., 2019) failed to calculate for the following reasons:   The 2019 ASCVD risk score is only  valid for ages 67 to 31   ---------------------------------------------------------------------------------------------------   Lipid/Cholesterol, Follow-up  Last lipid panel Other pertinent labs  Lab Results  Component Value Date   CHOL 180 06/21/2020   HDL 82 06/21/2020   LDLCALC 89 06/21/2020   TRIG 46 06/21/2020   CHOLHDL 2.2 06/21/2020   Lab Results  Component Value Date   ALT 11 06/21/2020   AST 16 06/21/2020   PLT 288 06/21/2020   TSH 1.900 11/24/2015     She was last seen for this 3 months ago.  Management since that visit includes continuing same medication.  She reports good compliance with treatment. She is not having side effects.   Symptoms: No chest pain No chest pressure/discomfort  No dyspnea Yes lower extremity edema  No numbness or tingling of extremity No orthopnea  No palpitations No paroxysmal nocturnal dyspnea  No speech difficulty No syncope   Current diet: well balanced Current exercise: none  The ASCVD Risk score (Arnett DK, et al., 2019) failed to calculate for the following reasons:   The 2019 ASCVD risk score is only valid for ages 44 to 56  ---------------------------------------------------------------------------------------------------    Medications: Outpatient Medications Prior to Visit  Medication Sig   Acetaminophen 500 MG capsule Take 2 capsules by mouth as needed for fever.   amLODipine (NORVASC) 2.5 MG tablet TAKE 1 TABLET BY MOUTH DAILY  amoxicillin (AMOXIL) 500 MG capsule Take 2,000 mg by mouth as directed. Prior to dental procedures   aspirin 81 MG tablet Take 81 mg by mouth daily.   cholecalciferol (VITAMIN D) 1000 UNITS tablet Take 2,000 Units by mouth daily.    Coenzyme Q10 (CO Q-10) 200 MG CAPS Take 200 mg by mouth daily.   dorzolamide-timolol (COSOPT) 22.3-6.8 MG/ML ophthalmic solution    fluticasone (FLONASE) 50 MCG/ACT nasal spray USE TWO SPRAYS IN EACH NOSTRIL EACH DAY AS DIRECTED BY PHYSICIAN    hydrochlorothiazide (HYDRODIURIL) 12.5 MG tablet TAKE 1 TABLET BY MOUTH EVERY DAY   latanoprost (XALATAN) 0.005 % ophthalmic solution Place 1 drop into both eyes at bedtime.   letrozole (FEMARA) 2.5 MG tablet Take 2.5 mg by mouth daily.   loratadine (CLARITIN) 10 MG tablet Take 10 mg by mouth daily as needed for allergies.   lovastatin (MEVACOR) 40 MG tablet TAKE ONE (1) TABLET AT BEDTIME   LUMIGAN 0.01 % SOLN Place 1 drop into both eyes at bedtime.    metoprolol succinate (TOPROL-XL) 50 MG 24 hr tablet TAKE ONE TABLET DAILY WITH ANY MEAL OR IMMEDIATELY FOLLOWING A MEAL   quinapril (ACCUPRIL) 40 MG tablet Take 1 tablet (40 mg total) by mouth at bedtime.   timolol (TIMOPTIC) 0.5 % ophthalmic solution Apply to eye.   No facility-administered medications prior to visit.    Allergies  Allergen Reactions   Codeine Other (See Comments)   Seasonal Ic [Cholestatin]    Covera-Hs [Verapamil] Rash    Patient Care Team: Birdie Sons, MD as PCP - General (Family Medicine) Warden Fillers, MD as Consulting Physician (Ophthalmology) Chelsea Aus, DDS as Consulting Physician (Dentistry) Clyde Canterbury, MD as Referring Physician (Otolaryngology) Gardiner Barefoot, DPM as Consulting Physician (Podiatry) Danella Sensing, MD as Consulting Physician (Dermatology)  Review of Systems  Constitutional:  Negative for chills, fatigue and fever.  HENT:  Positive for dental problem. Negative for congestion, ear pain, rhinorrhea, sneezing and sore throat.   Eyes: Negative.  Negative for pain and redness.  Respiratory:  Negative for cough, shortness of breath and wheezing.   Cardiovascular:  Positive for leg swelling (in feet). Negative for chest pain.  Gastrointestinal:  Negative for abdominal pain, blood in stool, constipation, diarrhea and nausea.  Endocrine: Negative for polydipsia and polyphagia.  Genitourinary: Negative.  Negative for dysuria, flank pain, hematuria, pelvic pain, vaginal bleeding and  vaginal discharge.  Musculoskeletal:  Negative for arthralgias, back pain, gait problem and joint swelling.  Skin:  Negative for rash.  Neurological: Negative.  Negative for dizziness, tremors, seizures, weakness, light-headedness, numbness and headaches.  Hematological:  Negative for adenopathy.  Psychiatric/Behavioral: Negative.  Negative for behavioral problems, confusion and dysphoric mood. The patient is not nervous/anxious and is not hyperactive.        Objective    Vitals: BP (!) 151/62 (BP Location: Right Arm, Patient Position: Sitting, Cuff Size: Normal)   Pulse 63   Temp 98.1 F (36.7 C) (Oral)   Resp 18   Ht 5\' 2"  (1.575 m)   Wt 134 lb (60.8 kg)   SpO2 100% Comment: room air  BMI 24.51 kg/m    Today's Vitals   06/27/21 0959 06/27/21 1016  BP: (!) 154/63 (!) 151/62  Pulse: 63   Resp: 18   Temp: 98.1 F (36.7 C)   TempSrc: Oral   SpO2: 100%   Weight: 134 lb (60.8 kg)   Height: 5\' 2"  (1.575 m)    Body mass index is 24.[place]  kg/m.   Physical Exam    General Appearance:    Well developed, well nourished female. Alert, cooperative, in no acute distress, appears stated age   Head:    Normocephalic, without obvious abnormality, atraumatic  Eyes:    PERRL, conjunctiva/corneas clear, EOM's intact, fundi    benign, both eyes  Ears:    Normal TM's and external ear canals, both ears  Neck:   Supple, symmetrical, trachea midline, no adenopathy;    thyroid:  no enlargement/tenderness/nodules; no carotid   bruit or JVD  Back:     Kyphotic, ROM normal, no CVA tenderness  Lungs:     Clear to auscultation bilaterally, respirations unlabored  Chest Wall:    No tenderness or deformity   Heart:    Normal heart rate. Normal rhythm. No murmurs, rubs, or gallops.   Breast Exam:    Deferred to oncology/surgery  Abdomen:     Soft, non-tender, bowel sounds active all four quadrants,    no masses, no organomegaly  Pelvic:    deferred  Extremities:   All extremities are intact. No  cyanosis. 1+ edema  Pulses:   2+ and symmetric all extremities  Skin:   Skin color, texture, turgor normal, no rashes or lesions  Lymph nodes:   Cervical, supraclavicular, and axillary nodes normal  Neurologic:   CNII-XII intact, normal strength, sensation and reflexes    throughout     Assessment & Plan    1. Annual physical exam Generally doing well.   2. Primary hypertension Uncontrolled, anticipate increasing hctz of amlodipine after reviewing labs.  - TSH  3. Osteoporosis without current pathological fracture, unspecified osteoporosis type On bisphosphonate holiday since December 2020. Last BMD 09/2019. Repeat BMD in 2023.  - TSH  4. Vitamin D deficiency  - VITAMIN D 25 Hydroxy (Vit-D Deficiency, Fractures)  5. Hyperlipidemia, unspecified hyperlipidemia type She is tolerating lovastatin well with no adverse effects.   - CBC - Lipid panel - Comprehensive metabolic panel  6. Need for influenza vaccination  - Flu Vaccine QUAD High Dose IM (Fluad)  7. Malignant neoplasm of 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, unspecified estrogen receptor status ([place]) Doing well on Femara managed by oncology.      The entirety of the information documented in the History of Present Illness, Review of Systems and Physical Exam were personally obtained by me. Portions of this information were initially documented by the CMA and reviewed by me for thoroughness and accuracy.     Lelon Huh, MD  [facility] [phone] (phone) [phone] (fax)  [place]

## 2021-06-27 ENCOUNTER — Ambulatory Visit (INDEPENDENT_AMBULATORY_CARE_PROVIDER_SITE_OTHER): Payer: Medicare PPO | Admitting: Family Medicine

## 2021-06-27 ENCOUNTER — Other Ambulatory Visit: Payer: Self-pay

## 2021-06-27 ENCOUNTER — Encounter: Payer: Self-pay | Admitting: Family Medicine

## 2021-06-27 VITALS — BP 151/62 | HR 63 | Temp 98.1°F | Resp 18 | Ht 62.0 in | Wt 134.0 lb

## 2021-06-27 DIAGNOSIS — Z23 Encounter for immunization: Secondary | ICD-10-CM

## 2021-06-27 DIAGNOSIS — M810 Age-related osteoporosis without current pathological fracture: Secondary | ICD-10-CM | POA: Diagnosis not present

## 2021-06-27 DIAGNOSIS — C50212 Malignant neoplasm of 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: Secondary | ICD-10-CM

## 2021-06-27 DIAGNOSIS — E559 Vitamin D deficiency, unspecified: Secondary | ICD-10-CM | POA: Diagnosis not present

## 2021-06-27 DIAGNOSIS — E785 Hyperlipidemia, unspecified: Secondary | ICD-10-CM

## 2021-06-27 DIAGNOSIS — I10 Essential (primary) hypertension: Secondary | ICD-10-CM | POA: Diagnosis not present

## 2021-06-27 DIAGNOSIS — Z0000 Encounter for general adult medical examination without abnormal findings: Secondary | ICD-10-CM | POA: Diagnosis not present

## 2021-06-27 NOTE — Progress Notes (Signed)
Annual Wellness Visit     Patient: Anna Johnson, Female    DOB: 1941-01-18, 80 y.o.   MRN: [id number] Visit Date: 06/27/2021  Today's Provider: Lelon Huh, MD    Subjective    Anna Johnson is a 80 y.o. female who presents today for her Annual Wellness Visit.   Medications: Outpatient Medications Prior to Visit  Medication Sig   Acetaminophen 500 MG capsule Take 2 capsules by mouth as needed for fever.   amLODipine (NORVASC) 2.5 MG tablet TAKE 1 TABLET BY MOUTH DAILY   amoxicillin (AMOXIL) 500 MG capsule Take 2,000 mg by mouth as directed. Prior to dental procedures   aspirin 81 MG tablet Take 81 mg by mouth daily.   cholecalciferol (VITAMIN D) 1000 UNITS tablet Take 2,000 Units by mouth daily.    Coenzyme Q10 (CO Q-10) 200 MG CAPS Take 200 mg by mouth daily.   dorzolamide-timolol (COSOPT) 22.3-6.8 MG/ML ophthalmic solution    fluticasone (FLONASE) 50 MCG/ACT nasal spray USE TWO SPRAYS IN EACH NOSTRIL EACH DAY AS DIRECTED BY PHYSICIAN   hydrochlorothiazide (HYDRODIURIL) 12.5 MG tablet TAKE 1 TABLET BY MOUTH EVERY DAY   latanoprost (XALATAN) 0.005 % ophthalmic solution Place 1 drop into both eyes at bedtime.   letrozole (FEMARA) 2.5 MG tablet Take 2.5 mg by mouth daily.   loratadine (CLARITIN) 10 MG tablet Take 10 mg by mouth daily as needed for allergies.   lovastatin (MEVACOR) 40 MG tablet TAKE ONE (1) TABLET AT BEDTIME   LUMIGAN 0.01 % SOLN Place 1 drop into both eyes at bedtime.    metoprolol succinate (TOPROL-XL) 50 MG 24 hr tablet TAKE ONE TABLET DAILY WITH ANY MEAL OR IMMEDIATELY FOLLOWING A MEAL   quinapril (ACCUPRIL) 40 MG tablet Take 1 tablet (40 mg total) by mouth at bedtime.   timolol (TIMOPTIC) 0.5 % ophthalmic solution Apply to eye.   No facility-administered medications prior to visit.    Allergies  Allergen Reactions   Codeine Other (See Comments)   Seasonal Ic [Cholestatin]    Covera-Hs [Verapamil] Rash    Patient Care Team: Birdie Sons,  MD as PCP - General (Family Medicine) Warden Fillers, MD as Consulting Physician (Ophthalmology) Chelsea Aus, DDS as Consulting Physician (Dentistry) Clyde Canterbury, MD as Referring Physician (Otolaryngology) Gardiner Barefoot, DPM as Consulting Physician (Podiatry) Danella Sensing, MD as Consulting Physician (Dermatology)     Objective      Most recent functional status assessment: In your present state of health, do you have any difficulty performing the following activities: 06/27/2021  Hearing? N  Vision? N  Difficulty concentrating or making decisions? N  Walking or climbing stairs? Y  Dressing or bathing? N  Doing errands, shopping? N  Some recent data might be hidden   Most recent fall risk assessment: Fall Risk  06/27/2021  Falls in the past year? 0  Number falls in past yr: 0  Injury with Fall? 0  Risk for fall due to : No Fall Risks  Follow up Falls evaluation completed    Most recent depression screenings: PHQ 2/9 Scores 06/27/2021 02/28/2021  PHQ - 2 Score 0 0  PHQ- 9 Score 1 0   Most recent cognitive screening: 6CIT Screen 12/28/2019  What Year? 0 points  What month? 0 points  What time? 0 points  Count back from 20 0 points  Months in reverse 0 points  Repeat phrase 0 points  Total Score 0   Most recent Audit-C alcohol use screening  Alcohol Use Disorder Test (AUDIT) 02/28/2021  1. How often do you have a drink containing alcohol? 3  2. How many drinks containing alcohol do you have on a typical day when you are drinking? 0  3. How often do you have six or more drinks on one occasion? 0  AUDIT-C Score 3  Alcohol Brief Interventions/Follow-up -   A score of 3 or more in women, and 4 or more in men indicates increased risk for alcohol abuse, EXCEPT if all of the points are from question 1   No results found for any visits on 06/27/21.  Assessment & Plan     Annual wellness visit done today including the all of the following: Reviewed patient's Family  Medical History Reviewed and updated list of patient's medical providers Assessment of cognitive impairment was done Assessed patient's functional ability Established a written schedule for health screening [place] Completed and Reviewed  Exercise Activities and Dietary recommendations  Goals      DIET - INCREASE WATER INTAKE     Recommend increasing water intake to 4-6 glasses a day.      Exercise     Recommend increasing exercise/activity. Pt to start walking 2 days a week for 45 minutes.         Immunization History  Administered Date(s) Administered   Fluad Quad(high Dose 65+) 05/26/2019, 06/20/2020, 06/27/2021   Influenza Split 04/29/2011, 05/04/2012   Influenza, High Dose Seasonal PF 05/08/2015, 05/30/2016, 06/10/2017, 07/01/2018   Influenza,inj,Quad PF,6+ Mos 05/03/2013, 04/27/2014   PFIZER Comirnaty(Gray Top)Covid-19 Tri-Sucrose Vaccine 09/17/2019, 10/08/2019   PFIZER(Purple Top)SARS-COV-2 Vaccination 09/17/2019, 10/08/2019, 07/19/2020   Pneumococcal Conjugate-13 11/03/2013   Pneumococcal Polysaccharide-23 06/04/2008, 07/01/2018   Td 04/13/2008   Tdap 04/29/2011   Zoster, Live 04/13/2008    Health Maintenance  Topic Date Due   Zoster Vaccines- Shingrix (1 of 2) Never done   COVID-19 Vaccine (4 - Booster for Pfizer series) 09/13/2020   TETANUS/TDAP  04/28/2021   DEXA SCAN  10/06/2021   Pneumonia Vaccine 68+ Years old  Completed   INFLUENZA VACCINE  Completed   HPV VACCINES  Aged Out     Discussed health benefits of physical activity, and encouraged her to engage in regular exercise appropriate for her age and condition.        The entirety of the information documented in the History of Present Illness, Review of Systems and Physical Exam were personally obtained by me. Portions of this information were initially documented by the CMA and reviewed by me for thoroughness and accuracy.     Lelon Huh, MD  [facility] [phone] (phone) [phone] (fax)  [place]

## 2021-06-27 NOTE — Patient Instructions (Addendum)
We will need to increase the dose of one of your blood pressure medications after we review the results of your labs  I strongly recommend a Covid bivalent (omicron) booster if you have not yet had one   You are due for a Tdap (tetanus-diptheria-pertussis vaccine) which protects you from tetanus and whooping cough. Please check with your insurance plan or pharmacy regarding coverage for this vaccine.    The CDC recommends two doses of Shingrix (the shingles vaccine) separated by 2 to 6 months for adults age 9 years and older. I recommend checking with your pharmacy regarding coverage for this vaccine.

## 2021-06-28 ENCOUNTER — Other Ambulatory Visit: Payer: Self-pay | Admitting: Family Medicine

## 2021-06-28 DIAGNOSIS — I10 Essential (primary) hypertension: Secondary | ICD-10-CM

## 2021-06-28 LAB — COMPREHENSIVE METABOLIC PANEL
ALT: 12 IU/L (ref 0–32)
AST: 23 IU/L (ref 0–40)
Albumin/Globulin Ratio: 2 (ref 1.2–2.2)
Albumin: 4.5 g/dL (ref 3.6–4.6)
Alkaline Phosphatase: 50 IU/L (ref 44–121)
BUN/Creatinine Ratio: 17 (ref 12–28)
BUN: 12 mg/dL (ref 8–27)
Bilirubin Total: 0.6 mg/dL (ref 0.0–1.2)
CO2: 25 mmol/L (ref 20–29)
Calcium: 9.9 mg/dL (ref 8.7–10.3)
Chloride: 96 mmol/L (ref 96–106)
Creatinine, Ser: 0.71 mg/dL (ref 0.57–1.00)
Globulin, Total: 2.2 g/dL (ref 1.5–4.5)
Glucose: 95 mg/dL (ref 70–99)
Potassium: 4.4 mmol/L (ref 3.5–5.2)
Sodium: 134 mmol/L (ref 134–144)
Total Protein: 6.7 g/dL (ref 6.0–8.5)
eGFR: 85 mL/min/{1.73_m2} (ref >59)

## 2021-06-28 LAB — CBC
Hematocrit: 38.7 % (ref 34.0–46.6)
Hemoglobin: 13.2 g/dL (ref 11.1–15.9)
MCH: 32.5 pg (ref 26.6–33.0)
MCHC: 34.1 g/dL (ref 31.5–35.7)
MCV: 95 fL (ref 79–97)
Platelets: 281 10*3/uL (ref 150–450)
RBC: 4.06 x10E6/uL (ref 3.77–5.28)
RDW: 11.8 % (ref 11.7–15.4)
WBC: 6 10*3/uL (ref 3.4–10.8)

## 2021-06-28 LAB — LIPID PANEL
Chol/HDL Ratio: 2 ratio (ref 0.0–4.4)
Cholesterol, Total: 192 mg/dL (ref 100–199)
HDL: 94 mg/dL (ref >39)
LDL Chol Calc (NIH): 90 mg/dL (ref 0–99)
Triglycerides: 38 mg/dL (ref 0–149)
VLDL Cholesterol Cal: 8 mg/dL (ref 5–40)

## 2021-06-28 LAB — VITAMIN D 25 HYDROXY (VIT D DEFICIENCY, FRACTURES): Vit D, 25-Hydroxy: 51.5 ng/mL (ref 30.0–100.0)

## 2021-06-28 LAB — TSH: TSH: 1 u[IU]/mL (ref 0.450–4.500)

## 2021-06-28 MED ORDER — HYDROCHLOROTHIAZIDE 25 MG PO TABS: 25.0000 mg | ORAL_TABLET | Freq: Every day | ORAL | 4 refills | Status: DC

## 2021-06-29 ENCOUNTER — Other Ambulatory Visit: Payer: Self-pay | Admitting: Family Medicine

## 2021-06-29 DIAGNOSIS — I10 Essential (primary) hypertension: Secondary | ICD-10-CM

## 2021-06-29 NOTE — Telephone Encounter (Signed)
Requested Prescriptions  Pending Prescriptions Disp Refills  . metoprolol succinate (TOPROL-XL) 50 MG 24 hr tablet [Pharmacy Med Name: METOPROLOL SUCCINATE ER 50 MG TAB] 90 tablet 4    Sig: TAKE 1 TABLET BY MOUTH DAILY WITH ANY MEAL OR IMMEDIATELY FOLLOWING A MEAL     Cardiovascular:  Beta Blockers Failed - 06/28/2021  3:22 PM      Failed - Last BP in normal range    BP Readings from Last 1 Encounters:  06/27/21 (!) 151/62         Passed - Last Heart Rate in normal range    Pulse Readings from Last 1 Encounters:  06/27/21 63         Passed - Valid encounter within last 6 months    Recent Outpatient Visits          2 days ago Annual physical exam   [facility], MD   4 months ago Hyperlipidemia, unspecified hyperlipidemia type   [facility] Birdie Sons, MD   1 year ago Primary hypertension   [facility] Birdie Sons, MD   1 year ago Essential hypertension   [facility] Birdie Sons, MD   1 year ago [place], Kirstie Peri, MD      Future Appointments            In 1 month Fisher, Kirstie Peri, MD [facility], [place]

## 2021-07-20 ENCOUNTER — Telehealth: Payer: Self-pay

## 2021-07-20 DIAGNOSIS — I10 Essential (primary) hypertension: Secondary | ICD-10-CM

## 2021-07-20 NOTE — Telephone Encounter (Signed)
Copied from [place] [id number]. Topic: General - Other >> Jul 20, 2021  2:21 PM Tessa Lerner A wrote: Reason for CRM: [place] with patient's pharmacy has called to share that the manufacturer will be discontinuing quinapril (ACCUPRIL) 40 MG tablet [[id number]]  The patient's pharmacy [facility]) has called to request additional information about an alternative for the patient   The pharmacy has 3 tablets of quinapril (ACCUPRIL) 40 MG tablet [[id number]] remaining and will be providing them to the patient today 07/20/21  Please contact further when possible

## 2021-07-24 MED ORDER — BENAZEPRIL HCL 20 MG PO TABS: 20.0000 mg | ORAL_TABLET | Freq: Every day | ORAL | 2 refills | Status: DC

## 2021-07-24 NOTE — Telephone Encounter (Signed)
Please advise have sent prescription for benazepril to take in its place.

## 2021-07-24 NOTE — Telephone Encounter (Signed)
Estill Bamberg from [facility] advised.    Thanks,   Mickel Baas

## 2021-07-24 NOTE — Telephone Encounter (Signed)
Pharmacy called in about what the PCP wanted to do about the medication for the pt, please advise.

## 2021-08-08 DIAGNOSIS — C50919 Malignant neoplasm of unspecified site of unspecified female breast: Secondary | ICD-10-CM | POA: Diagnosis not present

## 2021-08-08 DIAGNOSIS — G8929 Other chronic pain: Secondary | ICD-10-CM | POA: Diagnosis not present

## 2021-08-08 DIAGNOSIS — J302 Other seasonal allergic rhinitis: Secondary | ICD-10-CM | POA: Diagnosis not present

## 2021-08-08 DIAGNOSIS — Z79811 Long term (current) use of aromatase inhibitors: Secondary | ICD-10-CM | POA: Diagnosis not present

## 2021-08-08 DIAGNOSIS — E785 Hyperlipidemia, unspecified: Secondary | ICD-10-CM | POA: Diagnosis not present

## 2021-08-08 DIAGNOSIS — I10 Essential (primary) hypertension: Secondary | ICD-10-CM | POA: Diagnosis not present

## 2021-08-08 DIAGNOSIS — H409 Unspecified glaucoma: Secondary | ICD-10-CM | POA: Diagnosis not present

## 2021-08-08 DIAGNOSIS — M1990 Unspecified osteoarthritis, unspecified site: Secondary | ICD-10-CM | POA: Diagnosis not present

## 2021-08-08 DIAGNOSIS — M810 Age-related osteoporosis without current pathological fracture: Secondary | ICD-10-CM | POA: Diagnosis not present

## 2021-08-09 ENCOUNTER — Encounter: Payer: Self-pay | Admitting: Podiatry

## 2021-08-09 ENCOUNTER — Ambulatory Visit: Payer: Medicare PPO | Admitting: Podiatry

## 2021-08-09 ENCOUNTER — Other Ambulatory Visit: Payer: Self-pay

## 2021-08-09 DIAGNOSIS — B351 Tinea unguium: Secondary | ICD-10-CM | POA: Diagnosis not present

## 2021-08-09 DIAGNOSIS — M79674 Pain in right toe(s): Secondary | ICD-10-CM

## 2021-08-09 DIAGNOSIS — M79675 Pain in left toe(s): Secondary | ICD-10-CM

## 2021-08-09 DIAGNOSIS — L98491 Non-pressure chronic ulcer of skin of other sites limited to breakdown of skin: Secondary | ICD-10-CM

## 2021-08-09 DIAGNOSIS — L84 Corns and callosities: Secondary | ICD-10-CM

## 2021-08-09 DIAGNOSIS — L089 Local infection of the skin and subcutaneous tissue, unspecified: Secondary | ICD-10-CM

## 2021-08-09 NOTE — Progress Notes (Signed)
This patient presents to the office with painful fourth toe left foot.  Patient has significant pain walking.  She has history of flexor tenotomies third toes both feet by Dr.  Posey Pronto.  This patient returns to my office for at risk foot care.  This patient has red inflamed skin at the tip of fourth toe left foot  This patient is unable to cut nails herself since the patient cannot reach her nails.These nails are painful walking and wearing shoes.  This patient presents for at risk foot care today.  General Appearance  Alert, conversant and in no acute stress.  Vascular  Dorsalis pedis and posterior tibial  pulses are palpable  bilaterally.  Capillary return is within normal limits  bilaterally. Temperature is within normal limits  bilaterally.  Neurologic  Senn-Weinstein monofilament wire test within normal limits  bilaterally. Muscle power within normal limits bilaterally.  Nails Thick disfigured discolored nails with subungual debris  from hallux to fifth toes bilaterally. No evidence of bacterial infection or drainage bilaterally.  Orthopedic  No limitations of motion  feet .  No crepitus or effusions noted.  No bony pathology or digital deformities noted.  Skin  normotropic skin with no porokeratosis noted bilaterally.  No signs of infections or ulcers noted.   Inflamed skin due to inflamed clavi 4th toe left foot.  Onychomycosis  Pain in right toes  Pain in left toes  Inflamed clavi 4th toe left foot.  Consent was obtained for treatment procedures.   Mechanical debridement of nails 1-5  bilaterally performed with a nail nipper.  Filed with dremel without incident. Debride clavi with # 15 blade.  Neosporin 4th toe left foot cover by DSD.  Patient to peroxide her toe at night.  She was dispensed surgical shoe.  She needs to follow up with Dr.  Posey Pronto for flexor tenotomy 4th toe left foot in future.  RTC if toe worsens.   Return office visit  3 months                    Told patient to return for  periodic foot care and evaluation due to potential at risk complications.   Gardiner Barefoot DPM

## 2021-08-16 ENCOUNTER — Other Ambulatory Visit: Payer: Self-pay

## 2021-08-16 ENCOUNTER — Ambulatory Visit: Payer: Medicare PPO | Admitting: Podiatry

## 2021-08-16 ENCOUNTER — Encounter: Payer: Self-pay | Admitting: Podiatry

## 2021-08-16 DIAGNOSIS — M205X2 Other deformities of toe(s) (acquired), left foot: Secondary | ICD-10-CM | POA: Diagnosis not present

## 2021-08-16 NOTE — Progress Notes (Signed)
°  Subjective:  Patient ID: Anna Johnson, female    DOB: [birth year],  MRN: [id number]  Chief Complaint  Patient presents with   Toe Pain    "I don't think my toe has healed well."   80 y.o. female returns today for planned flexor tenotomy of the Left fourth digit digit.  Objective:  There were no vitals filed for this visit.  General AA&O x3. Normal mood and affect.  Vascular Pedal pulses palpable.  Neurologic Epicritic sensation grossly intact.  Dermatologic Pre-ulcerative callus at the tip of the left, fourth toe  Orthopedic: Semi-reducible hammertoe deformity left, fourth toe    Assessment & Plan:  Patient was evaluated and treated and all questions answered.  Hammertoe fourth left with pre-ulcerative callus -Flexor tenotomy as below. -Advised to remove the dressing in 24 hours and apply a band-aid and triple abx ointment every day thereafter.  Procedure: Flexor Tenotomy Indication for Procedure: toe with semi-reducible hammertoe with distal tip ulceration. Flexor tenotomy indicated to alleviate contracture, reduce pressure, and enhance healing of the ulceration. Location: left, 3rd toe Anesthesia: Lidocaine 1% plain; 1.5 mL and Marcaine 0.5% plain; 1.5 mL digital block Instrumentation: #11 handle Technique: The toe was anesthetized as above and prepped in the usual fashion. The toe was exsanquinated and a tourniquet was secured at the base of the toe. An #11 blade was then used to percutaneously release the flexor tendon at the plantar surface of the toe with noted release of the hammertoe deformity.  3-0 Prolene was used to close incision.  The incision was then dressed with antibiotic ointment and band-aid. Compression splint dressing applied. Patient tolerated the procedure well. Dressing: Dry, sterile, compression dressing. Disposition: Patient tolerated procedure well. Patient to return in 1 week for follow-up.      No follow-ups on file.

## 2021-08-20 ENCOUNTER — Encounter: Payer: Self-pay | Admitting: Family Medicine

## 2021-08-20 ENCOUNTER — Ambulatory Visit: Payer: Medicare PPO | Admitting: Family Medicine

## 2021-08-20 ENCOUNTER — Other Ambulatory Visit: Payer: Self-pay

## 2021-08-20 VITALS — BP 144/68 | HR 66 | Temp 97.9°F | Resp 16 | Wt 126.0 lb

## 2021-08-20 DIAGNOSIS — I10 Essential (primary) hypertension: Secondary | ICD-10-CM | POA: Diagnosis not present

## 2021-08-20 DIAGNOSIS — M810 Age-related osteoporosis without current pathological fracture: Secondary | ICD-10-CM | POA: Diagnosis not present

## 2021-08-20 MED ORDER — AMLODIPINE BESYLATE 5 MG PO TABS: 5.0000 mg | ORAL_TABLET | Freq: Every day | ORAL | 3 refills | Status: DC

## 2021-08-20 NOTE — Patient Instructions (Signed)
Please review the attached list of medications and notify my office if there are any errors.   You will be due for a bone density test in February. You should get a call early in the month to have it scheduled

## 2021-08-20 NOTE — Progress Notes (Signed)
Established patient visit   Patient: Anna Johnson   DOB: Jun 27, 1941   81 y.o. Female  MRN: [id number] Visit Date: 08/20/2021  Today's healthcare provider: Lelon Huh, MD   Chief Complaint  Patient presents with   Hypertension   Subjective    HPI  Hypertension, follow-up  BP Readings from Last 3 Encounters:  08/20/21 (!) 151/55  06/27/21 (!) 151/62  02/28/21 (!) 138/58   Wt Readings from Last 3 Encounters:  08/20/21 126 lb (57.2 kg)  06/27/21 134 lb (60.8 kg)  02/28/21 141 lb (64 kg)     She was last seen for hypertension 2 months ago.  BP at that visit was 151/62. Management since that visit includes increasing HCTZ to 18m daily.  She reports good compliance with treatment. She is not having side effects.  She is following a Regular diet. She is not exercising. She does not smoke.  Use of agents associated with hypertension: NSAIDS.   Outside blood pressures are not checked. Symptoms: No chest pain No chest pressure  No palpitations No syncope  No dyspnea No orthopnea  No paroxysmal nocturnal dyspnea No lower extremity edema   Pertinent labs: Lab Results  Component Value Date   CHOL 192 06/27/2021   HDL 94 06/27/2021   LDLCALC 90 06/27/2021   TRIG 38 06/27/2021   CHOLHDL 2.0 06/27/2021   Lab Results  Component Value Date   NA 134 06/27/2021   K 4.4 06/27/2021   CREATININE 0.71 06/27/2021   EGFR 85 06/27/2021   GLUCOSE 95 06/27/2021   TSH 1.000 06/27/2021     The ASCVD Risk score (Arnett DK, et al., 2019) failed to calculate for the following reasons:   The 2019 ASCVD risk score is only valid for ages 439to 760  ---------------------------------------------------------------------------------------------------   Medications: Outpatient Medications Prior to Visit  Medication Sig   Acetaminophen 500 MG capsule Take 2 capsules by mouth as needed for fever.   amLODipine (NORVASC) 2.5 MG tablet TAKE 1 TABLET BY MOUTH DAILY   amoxicillin  (AMOXIL) 500 MG capsule Take 2,000 mg by mouth as directed. Prior to dental procedures   aspirin 81 MG tablet Take 81 mg by mouth daily.   benazepril (LOTENSIN) 20 MG tablet Take 1 tablet (20 mg total) by mouth daily. Take in place of quinipril   cholecalciferol (VITAMIN D) 1000 UNITS tablet Take 2,000 Units by mouth daily.    Coenzyme Q10 (CO Q-10) 200 MG CAPS Take 200 mg by mouth daily.   dorzolamide-timolol (COSOPT) 22.3-6.8 MG/ML ophthalmic solution    fluticasone (FLONASE) 50 MCG/ACT nasal spray USE TWO SPRAYS IN EACH NOSTRIL EACH DAY AS DIRECTED BY PHYSICIAN   hydrochlorothiazide (HYDRODIURIL) 25 MG tablet Take 1 tablet (25 mg total) by mouth daily.   latanoprost (XALATAN) 0.005 % ophthalmic solution Place 1 drop into both eyes at bedtime.   letrozole (FEMARA) 2.5 MG tablet Take 2.5 mg by mouth daily.   loratadine (CLARITIN) 10 MG tablet Take 10 mg by mouth daily as needed for allergies.   lovastatin (MEVACOR) 40 MG tablet TAKE ONE (1) TABLET AT BEDTIME   LUMIGAN 0.01 % SOLN Place 1 drop into both eyes at bedtime.    metoprolol succinate (TOPROL-XL) 50 MG 24 hr tablet TAKE 1 TABLET BY MOUTH DAILY WITH ANY MEAL OR IMMEDIATELY FOLLOWING A MEAL   timolol (TIMOPTIC) 0.5 % ophthalmic solution Apply to eye.   No facility-administered medications prior to visit.    Review of  Systems  Constitutional:  Negative for appetite change, chills, fatigue and fever.  Respiratory:  Negative for chest tightness and shortness of breath.   Cardiovascular:  Negative for chest pain and palpitations.  Gastrointestinal:  Negative for abdominal pain, nausea and vomiting.  Neurological:  Negative for dizziness and weakness.      Objective    BP (!) 151/55 (BP Location: Right Arm, Patient Position: Sitting, Cuff Size: Normal)    Pulse 66    Temp 97.9 F (36.6 C) (Oral)    Resp 16    Wt 126 lb (57.2 kg)    SpO2 100% Comment: room air   BMI 23.05 kg/m  {Show previous vital signs (optional):23777}  Today's  Vitals   08/20/21 0937 08/20/21 0944 08/20/21 0955  BP: (!) 154/52 (!) 151/55 (!) 144/68  Pulse: 66    Resp: 16    Temp: 97.9 F (36.6 C)    TempSrc: Oral    SpO2: 100%    Weight: 126 lb (57.2 kg)     Body mass index is 23.05 kg/m.   Physical Exam   General appearance: Well developed, well nourished female, cooperative and in no acute distress Head: Normocephalic, without obvious abnormality, atraumatic Respiratory: Respirations even and unlabored, normal respiratory rate Extremities: All extremities are intact.  Skin: Skin color, texture, turgor normal. No rashes seen  Psych: Appropriate mood and affect. Neurologic: Mental status: Alert, oriented to person, place, and time, thought content appropriate.     Assessment & Plan     1. Essential hypertension Improved but not at goal since increasing hctz. Will increase from 2.28m to amLODipine (NORVASC) 5 MG tablet; Take 1 tablet (5 mg total) by mouth daily.  Dispense: 30 tablet; Refill: 3  2. Osteoporosis without current pathological fracture, unspecified osteoporosis type On bisphosphonate holiday since 2020. Advised pt she will be due for BMD in February.   Labs at follow up in about 3 months.       The entirety of the information documented in the History of Present Illness, Review of Systems and Physical Exam were personally obtained by me. Portions of this information were initially documented by the CMA and reviewed by me for thoroughness and accuracy.     DLelon Huh MD  [phone](phone) [phone](fax)  [place]

## 2021-08-23 DIAGNOSIS — H04123 Dry eye syndrome of bilateral lacrimal glands: Secondary | ICD-10-CM | POA: Diagnosis not present

## 2021-08-23 DIAGNOSIS — H26492 Other secondary cataract, left eye: Secondary | ICD-10-CM | POA: Diagnosis not present

## 2021-08-23 DIAGNOSIS — H353131 Nonexudative age-related macular degeneration, bilateral, early dry stage: Secondary | ICD-10-CM | POA: Diagnosis not present

## 2021-08-23 DIAGNOSIS — H401131 Primary open-angle glaucoma, bilateral, mild stage: Secondary | ICD-10-CM | POA: Diagnosis not present

## 2021-08-23 DIAGNOSIS — Z961 Presence of intraocular lens: Secondary | ICD-10-CM | POA: Diagnosis not present

## 2021-08-30 ENCOUNTER — Ambulatory Visit: Payer: Medicare PPO | Admitting: Podiatry

## 2021-09-04 ENCOUNTER — Ambulatory Visit: Payer: Medicare PPO | Admitting: Podiatry

## 2021-09-04 ENCOUNTER — Other Ambulatory Visit: Payer: Self-pay

## 2021-09-04 DIAGNOSIS — M205X2 Other deformities of toe(s) (acquired), left foot: Secondary | ICD-10-CM

## 2021-09-04 NOTE — Progress Notes (Signed)
°  Subjective:  Patient ID: Anna Johnson, female    DOB: [birth year],  MRN: [id number]  Chief Complaint  Patient presents with   Toe Pain   81 y.o. female returns today for follow-up status post flexor tenotomy of the Left fourth digit digit.  Objective:  There were no vitals filed for this visit.  General AA&O x3. Normal mood and affect.  Vascular Pedal pulses palpable.  Neurologic Epicritic sensation grossly intact.  Dermatologic Pre-ulcerative callus at the tip of the left, fourth toe which has been healed  Orthopedic: Semi-reducible hammertoe deformity left, fourth toe which has been reduced   Assessment & Plan:  Patient was evaluated and treated and all questions answered.  Hammertoe fourth left with pre-ulcerative callus -Clinically healed with good reduction of the fourth digit noted.  The distal callus/preulcerative site has also healed completely with reduction of pressure to the distal tip.  I once again discussed shoe gear modification and offloading and protecting.  She states understanding.  If any foot and ankle issues arise in future if another toe becomes too contracted and painful I will asked her to come see me.  She states understanding.    No follow-ups on file.

## 2021-09-13 ENCOUNTER — Other Ambulatory Visit: Payer: Self-pay | Admitting: Family Medicine

## 2021-09-13 DIAGNOSIS — M810 Age-related osteoporosis without current pathological fracture: Secondary | ICD-10-CM

## 2021-09-26 DIAGNOSIS — L821 Other seborrheic keratosis: Secondary | ICD-10-CM | POA: Diagnosis not present

## 2021-09-26 DIAGNOSIS — D225 Melanocytic nevi of trunk: Secondary | ICD-10-CM | POA: Diagnosis not present

## 2021-09-26 DIAGNOSIS — Z85828 Personal history of other malignant neoplasm of skin: Secondary | ICD-10-CM | POA: Diagnosis not present

## 2021-10-22 ENCOUNTER — Other Ambulatory Visit: Payer: Self-pay | Admitting: Family Medicine

## 2021-10-22 DIAGNOSIS — I10 Essential (primary) hypertension: Secondary | ICD-10-CM

## 2021-11-06 ENCOUNTER — Telehealth: Payer: Self-pay

## 2021-11-06 ENCOUNTER — Other Ambulatory Visit: Payer: Self-pay

## 2021-11-06 ENCOUNTER — Ambulatory Visit
Admission: RE | Admit: 2021-11-06 | Discharge: 2021-11-06 | Disposition: A | Discharge status: Home / Self Care | Payer: Medicare PPO | Source: Ambulatory Visit | Attending: Family Medicine | Admitting: Family Medicine

## 2021-11-06 ENCOUNTER — Other Ambulatory Visit: Payer: Self-pay | Admitting: Family Medicine

## 2021-11-06 DIAGNOSIS — M85851 Other specified disorders of bone density and structure, right thigh: Secondary | ICD-10-CM | POA: Diagnosis not present

## 2021-11-06 DIAGNOSIS — M810 Age-related osteoporosis without current pathological fracture: Secondary | ICD-10-CM | POA: Insufficient documentation

## 2021-11-06 MED ORDER — ALENDRONATE SODIUM 70 MG PO TABS: ORAL_TABLET | ORAL | 12 refills | Status: DC

## 2021-11-06 NOTE — Telephone Encounter (Signed)
Copied from [place] [phone]. Topic: Appointment Scheduling - Scheduling Inquiry for Clinic >> Nov 05, 2021  8:58 AM Bayard Beaver wrote: Reason for CRM: pt called in states already has bone density test at [facility], so wants to make sure her appt will be cancelled at [facility] for tomorrwow. Please call back further info and cancellation if this should be done.

## 2021-11-19 ENCOUNTER — Ambulatory Visit: Payer: Medicare PPO | Admitting: Podiatry

## 2021-11-22 ENCOUNTER — Encounter: Payer: Self-pay | Admitting: Podiatry

## 2021-11-22 ENCOUNTER — Ambulatory Visit: Payer: Medicare PPO | Admitting: Podiatry

## 2021-11-22 DIAGNOSIS — L84 Corns and callosities: Secondary | ICD-10-CM

## 2021-11-22 DIAGNOSIS — M79675 Pain in left toe(s): Secondary | ICD-10-CM

## 2021-11-22 DIAGNOSIS — B351 Tinea unguium: Secondary | ICD-10-CM | POA: Diagnosis not present

## 2021-11-22 DIAGNOSIS — M79674 Pain in right toe(s): Secondary | ICD-10-CM

## 2021-11-22 NOTE — Progress Notes (Signed)
This patient returns to the office for evaluation and treatment of long thick painful nails .  This patient is unable to trim her own nails since the patient cannot reach her feet.  Patient says the nails are painful walking and wearing her shoes.  She  returns for preventive foot care services. She says she has developed a callus on the tip of her second toe left foot..   ? ?General Appearance  Alert, conversant and in no acute stress. ? ?Vascular  Dorsalis pedis and posterior tibial  pulses are not  palpable  bilaterally.  Capillary return is within normal limits  bilaterally. Temperature is within normal limits  bilaterally. ? ?Neurologic  Senn-Weinstein monofilament wire test within normal limits  bilaterally. Muscle power within normal limits bilaterally. ? ?Nails Thick disfigured discolored nails with subungual debris  from hallux to fifth toes bilaterally. No evidence of bacterial infection or drainage bilaterally. ? ?Orthopedic  No limitations of motion  feet .  No crepitus or effusions noted.   Fourth toe right is straightened.  Contracted third digit left foot with medial deviation.  Midfoot  DJD  B/L. ? ?Skin  normotropic skin with no porokeratosis noted bilaterally.  No signs of infections or ulcers noted.   Clavi 2 nd toe left foot. ? ?Onychomycosis  Pain in toes right foot  Pain in toes left foot  Skin lesion third toe left foot. ? ?Debridement  of nails  1-5  B/L with a nail nipper.  Nails were then filed using a dremel tool with no incidents.  Debride clavi with # 15 blade.    RTC 3 months  ? ? ?Gardiner Barefoot DPM  ?

## 2021-12-03 NOTE — Progress Notes (Signed)
?  ? ? ?Established patient visit ? ? ?Patient: Anna Johnson   DOB: [birth year]   81 y.o. Female  MRN: [id number] ?Visit Date: 12/04/2021 ? ?Today's healthcare provider: Lelon Huh, MD  ? ?No chief complaint on file. ? ?Subjective  ?  ?HPI  ?Hypertension, follow-up ? ?BP Readings from Last 3 Encounters:  ?08/20/21 (!) 144/68  ?06/27/21 (!) 151/62  ?02/28/21 (!) 138/58  ? Wt Readings from Last 3 Encounters:  ?08/20/21 126 lb (57.2 kg)  ?06/27/21 134 lb (60.8 kg)  ?02/28/21 141 lb (64 kg)  ?  ? ?She was last seen for hypertension 3 months ago.  ?BP at that visit was 144/68. Management since that visit includes increase from 2.10m to 5 mg amLODipine . ? ?She reports good compliance with treatment. ?She is not having side effects.  ?Pertinent labs ?Lab Results  ?Component Value Date  ? CHOL 192 06/27/2021  ? HDL 94 06/27/2021  ? LNunn90 06/27/2021  ? TRIG 38 06/27/2021  ? CHOLHDL 2.0 06/27/2021  ? Lab Results  ?Component Value Date  ? NA 134 06/27/2021  ? K 4.4 06/27/2021  ? CREATININE 0.71 06/27/2021  ? EGFR 85 06/27/2021  ? GLUCOSE 95 06/27/2021  ? TSH 1.000 06/27/2021  ?  ? ?The ASCVD Risk score (Arnett DK, et al., 2019) failed to calculate for the following reasons: ?  The 2019 ASCVD risk score is only valid for ages 449to 740? ?---------------------------------------------------------------------------------------------------  ? ?Medications: ?Outpatient Medications Prior to Visit  ?Medication Sig  ? Acetaminophen 500 MG capsule Take 2 capsules by mouth as needed for fever.  ? alendronate (FOSAMAX) 70 MG tablet TAKE ONE TABLET EVERY WEEK  ? amLODipine (NORVASC) 5 MG tablet Take 1 tablet (5 mg total) by mouth daily.  ? amoxicillin (AMOXIL) 500 MG capsule Take 2,000 mg by mouth as directed. Prior to dental procedures  ? aspirin 81 MG tablet Take 81 mg by mouth daily.  ? benazepril (LOTENSIN) 20 MG tablet TAKE 1 TABLET BY MOUTH DAILY. TAKE IN PLACE OF QUINAPRIL.  ? cholecalciferol (VITAMIN D) 1000 UNITS tablet  Take 2,000 Units by mouth daily.   ? Coenzyme Q10 (CO Q-10) 200 MG CAPS Take 200 mg by mouth daily.  ? dorzolamide-timolol (COSOPT) 22.3-6.8 MG/ML ophthalmic solution   ? fluticasone (FLONASE) 50 MCG/ACT nasal spray USE TWO SPRAYS IN EACH NOSTRIL EACH DAY AS DIRECTED BY PHYSICIAN  ? hydrochlorothiazide (HYDRODIURIL) 25 MG tablet Take 1 tablet (25 mg total) by mouth daily.  ? latanoprost (XALATAN) 0.005 % ophthalmic solution Place 1 drop into both eyes at bedtime.  ? letrozole (FEMARA) 2.5 MG tablet Take 2.5 mg by mouth daily.  ? loratadine (CLARITIN) 10 MG tablet Take 10 mg by mouth daily as needed for allergies.  ? lovastatin (MEVACOR) 40 MG tablet TAKE ONE (1) TABLET AT BEDTIME  ? LUMIGAN 0.01 % SOLN Place 1 drop into both eyes at bedtime.   ? metoprolol succinate (TOPROL-XL) 50 MG 24 hr tablet TAKE 1 TABLET BY MOUTH DAILY WITH ANY MEAL OR IMMEDIATELY FOLLOWING A MEAL  ? timolol (TIMOPTIC) 0.5 % ophthalmic solution Apply to eye.  ? ?No facility-administered medications prior to visit.  ? ? ?Review of Systems ? ? ?  Objective  ?  ?BP 136/70   Pulse (!) 58   Temp 97.6 ?F (36.4 ?C) (Oral)   Wt 126 lb (57.2 kg)   SpO2 100%   BMI 23.05 kg/m?  ? ? ?Physical Exam  ? ?General: Appearance:  Well developed, well nourished female in no acute distress  ?Eyes:    PERRL, conjunctiva/corneas clear, EOM's intact       ?Lungs:     Clear to auscultation bilaterally, respirations unlabored  ?Heart:    Bradycardic. Normal rhythm. No murmurs, rubs, or gallops.    ?MS:   All extremities are intact.    ?Neurologic:   Awake, alert, oriented x 3. No apparent focal neurological defect.   ?   ?  ? Assessment & Plan  ?  ? ?1. Primary hypertension ?Doing well with increase dose of amlodipine. Continue current medications.   ?Future Appointments  ?Date Time Provider [place]  ?02/28/2022 10:30 AM Gardiner Barefoot, DPM [facility]  ?06/28/2022 10:40 AM Caryn Section Kirstie Peri, MD [facility]  ?   ?   ? ?The entirety of the  information documented in the History of Present Illness, Review of Systems and Physical Exam were personally obtained by me. Portions of this information were initially documented by the CMA and reviewed by me for thoroughness and accuracy.   ? ? ?Lelon Huh, MD  ?[facility] ?[phone] (phone) ?[phone] (fax) ? ?[facility]  ?

## 2021-12-04 ENCOUNTER — Ambulatory Visit: Payer: Medicare PPO | Admitting: Family Medicine

## 2021-12-04 ENCOUNTER — Encounter: Payer: Self-pay | Admitting: Family Medicine

## 2021-12-04 VITALS — BP 136/70 | HR 58 | Temp 97.6°F | Wt 126.0 lb

## 2021-12-04 DIAGNOSIS — I10 Essential (primary) hypertension: Secondary | ICD-10-CM

## 2021-12-28 ENCOUNTER — Other Ambulatory Visit: Payer: Self-pay | Admitting: Family Medicine

## 2021-12-28 DIAGNOSIS — I10 Essential (primary) hypertension: Secondary | ICD-10-CM

## 2021-12-28 NOTE — Telephone Encounter (Signed)
Requested Prescriptions  Pending Prescriptions Disp Refills  . amLODipine (NORVASC) 5 MG tablet [Pharmacy Med Name: AMLODIPINE BESYLATE 5 MG TAB] 90 tablet 1    Sig: TAKE 1 TABLET BY MOUTH DAILY     Cardiovascular: Calcium Channel Blockers 2 Passed - 12/28/2021  2:43 PM      Passed - Last BP in normal range    BP Readings from Last 1 Encounters:  12/04/21 136/70         Passed - Last Heart Rate in normal range    Pulse Readings from Last 1 Encounters:  12/04/21 (!) 23         Passed - Valid encounter within last 6 months    Recent Outpatient Visits          3 weeks ago Primary hypertension   [facility] Birdie Sons, MD   4 months ago Osteoporosis without current pathological fracture, unspecified osteoporosis type   [facility] Birdie Sons, MD   6 months ago Annual physical exam   [place], [place]   10 months ago Hyperlipidemia, unspecified hyperlipidemia type   [facility] Birdie Sons, MD   1 year ago Primary hypertension   [facility] Birdie Sons, MD      Future Appointments            In 6 months Fisher, Kirstie Peri, MD [facility], [place]

## 2022-01-29 ENCOUNTER — Other Ambulatory Visit: Payer: Self-pay | Admitting: Family Medicine

## 2022-01-29 DIAGNOSIS — I10 Essential (primary) hypertension: Secondary | ICD-10-CM

## 2022-02-28 ENCOUNTER — Encounter: Payer: Self-pay | Admitting: Podiatry

## 2022-02-28 ENCOUNTER — Ambulatory Visit: Payer: Medicare PPO | Admitting: Podiatry

## 2022-02-28 DIAGNOSIS — M79675 Pain in left toe(s): Secondary | ICD-10-CM

## 2022-02-28 DIAGNOSIS — M205X2 Other deformities of toe(s) (acquired), left foot: Secondary | ICD-10-CM

## 2022-02-28 DIAGNOSIS — M79674 Pain in right toe(s): Secondary | ICD-10-CM | POA: Diagnosis not present

## 2022-02-28 DIAGNOSIS — B351 Tinea unguium: Secondary | ICD-10-CM

## 2022-02-28 NOTE — Progress Notes (Signed)
This patient returns to the office for evaluation and treatment of long thick painful nails .  This patient is unable to trim her own nails since the patient cannot reach her feet.  Patient says the nails are painful walking and wearing her shoes.  She  returns for preventive foot care services. .    General Appearance  Alert, conversant and in no acute stress.  Vascular  Dorsalis pedis and posterior tibial  pulses are not  palpable  bilaterally.  Capillary return is within normal limits  bilaterally. Temperature is within normal limits  bilaterally.  Neurologic  Senn-Weinstein monofilament wire test within normal limits  bilaterally. Muscle power within normal limits bilaterally.  Nails Thick disfigured discolored nails with subungual debris  from hallux to fifth toes bilaterally. No evidence of bacterial infection or drainage bilaterally.  Orthopedic  No limitations of motion  feet .  No crepitus or effusions noted.   Fourth toe right is straightened.  Contracted third digit left foot with medial deviation.  Midfoot  DJD  B/L.  Skin  normotropic skin with no porokeratosis noted bilaterally.  No signs of infections or ulcers noted.   Clavi 2 nd toe left foot.  Onychomycosis  Pain in toes right foot  Pain in toes left foot  Skin lesion third toe left foot.  Debridement  of nails  1-5  B/L with a nail nipper.  Nails were then filed using a dremel tool with no incidents.  Debride clavi with # 15 blade.    RTC 3 months    Dearia Wilmouth DPM  

## 2022-03-05 DIAGNOSIS — H04123 Dry eye syndrome of bilateral lacrimal glands: Secondary | ICD-10-CM | POA: Diagnosis not present

## 2022-03-05 DIAGNOSIS — H401131 Primary open-angle glaucoma, bilateral, mild stage: Secondary | ICD-10-CM | POA: Diagnosis not present

## 2022-03-05 DIAGNOSIS — H353131 Nonexudative age-related macular degeneration, bilateral, early dry stage: Secondary | ICD-10-CM | POA: Diagnosis not present

## 2022-03-05 DIAGNOSIS — Z961 Presence of intraocular lens: Secondary | ICD-10-CM | POA: Diagnosis not present

## 2022-03-05 DIAGNOSIS — H26492 Other secondary cataract, left eye: Secondary | ICD-10-CM | POA: Diagnosis not present

## 2022-03-15 ENCOUNTER — Other Ambulatory Visit: Payer: Self-pay | Admitting: Family Medicine

## 2022-03-15 DIAGNOSIS — E785 Hyperlipidemia, unspecified: Secondary | ICD-10-CM

## 2022-03-15 NOTE — Telephone Encounter (Signed)
Requested Prescriptions  Pending Prescriptions Disp Refills  . lovastatin (MEVACOR) 40 MG tablet [Pharmacy Med Name: LOVASTATIN 40 MG TAB] 90 tablet 4    Sig: TAKE 1 TABLET BY MOUTH AT BEDTIME     Cardiovascular:  Antilipid - Statins 2 Failed - 03/15/2022  1:23 PM      Failed - Lipid Panel in normal range within the last 12 months    Cholesterol, Total  Date Value Ref Range Status  06/27/2021 192 100 - 199 mg/dL Final   LDL Cholesterol (Calc)  Date Value Ref Range Status  06/11/2017 85 mg/dL (calc) Final    Comment:    Reference range: <100 . Desirable range <100 mg/dL for primary prevention;   <70 mg/dL for patients with CHD or diabetic patients  with > or = 2 CHD risk factors. Marland Kitchen LDL-C is now calculated using the Martin-Hopkins  calculation, which is a validated novel method providing  better accuracy than the Friedewald equation in the  estimation of LDL-C.  Cresenciano Genre et al. Annamaria Helling. 0712;197(58): 2061-2068  (http://education.[facility].com/faq/FAQ164)    LDL Chol Calc (NIH)  Date Value Ref Range Status  06/27/2021 90 0 - 99 mg/dL Final   HDL  Date Value Ref Range Status  06/27/2021 94 >39 mg/dL Final   Triglycerides  Date Value Ref Range Status  06/27/2021 38 0 - 149 mg/dL Final         Passed - Cr in normal range and within 360 days    Creat  Date Value Ref Range Status  06/11/2017 0.76 0.60 - 0.93 mg/dL Final    Comment:    For patients >10 years of age, the reference limit for Creatinine is approximately 13% higher for people identified as African-American. .    Creatinine, Ser  Date Value Ref Range Status  06/27/2021 0.71 0.57 - 1.00 mg/dL Final         Passed - Patient is not pregnant      Passed - Valid encounter within last 12 months    Recent Outpatient Visits          3 months ago Primary hypertension   [facility] Birdie Sons, MD   6 months ago Osteoporosis without current pathological fracture, unspecified  osteoporosis type   [facility] Birdie Sons, MD   8 months ago Annual physical exam   [facility] Birdie Sons, MD   1 year ago Hyperlipidemia, unspecified hyperlipidemia type   [facility] Birdie Sons, MD   1 year ago Primary hypertension   [facility] Birdie Sons, MD      Future Appointments            In 3 months Fisher, Kirstie Peri, MD [facility], [place]

## 2022-03-25 DIAGNOSIS — Z170 Estrogen receptor positive status [ER+]: Secondary | ICD-10-CM | POA: Diagnosis not present

## 2022-03-25 DIAGNOSIS — Z79811 Long term (current) use of aromatase inhibitors: Secondary | ICD-10-CM | POA: Diagnosis not present

## 2022-03-25 DIAGNOSIS — C50212 Malignant neoplasm of 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: Secondary | ICD-10-CM | POA: Diagnosis not present

## 2022-03-25 DIAGNOSIS — M8000XD Age-related osteoporosis with current pathological fracture, unspecified site, subsequent encounter for fracture with routine healing: Secondary | ICD-10-CM | POA: Diagnosis not present

## 2022-03-25 DIAGNOSIS — Z1231 Encounter for screening mammogram for malignant neoplasm of breast: Secondary | ICD-10-CM | POA: Diagnosis not present

## 2022-03-25 DIAGNOSIS — R921 Mammographic calcification found on diagnostic imaging of breast: Secondary | ICD-10-CM | POA: Diagnosis not present

## 2022-03-25 DIAGNOSIS — Z9189 Other specified personal risk factors, not elsewhere classified: Secondary | ICD-10-CM | POA: Diagnosis not present

## 2022-03-25 DIAGNOSIS — Z853 Personal history of malignant neoplasm of breast: Secondary | ICD-10-CM | POA: Diagnosis not present

## 2022-03-28 DIAGNOSIS — N6032 Fibrosclerosis of left breast: Secondary | ICD-10-CM | POA: Diagnosis not present

## 2022-03-28 DIAGNOSIS — N641 Fat necrosis of breast: Secondary | ICD-10-CM | POA: Diagnosis not present

## 2022-03-28 DIAGNOSIS — R921 Mammographic calcification found on diagnostic imaging of breast: Secondary | ICD-10-CM | POA: Diagnosis not present

## 2022-06-06 ENCOUNTER — Ambulatory Visit: Payer: Medicare PPO | Admitting: Podiatry

## 2022-06-14 ENCOUNTER — Other Ambulatory Visit: Payer: Self-pay | Admitting: Family Medicine

## 2022-06-14 DIAGNOSIS — E785 Hyperlipidemia, unspecified: Secondary | ICD-10-CM

## 2022-06-14 NOTE — Telephone Encounter (Signed)
Requested Prescriptions  Pending Prescriptions Disp Refills   lovastatin (MEVACOR) 40 MG tablet [Pharmacy Med Name: LOVASTATIN 40 MG TAB] 90 tablet 0    Sig: TAKE 1 TABLET BY MOUTH AT BEDTIME     Cardiovascular:  Antilipid - Statins 2 Failed - 06/14/2022  4:26 PM      Failed - Lipid Panel in normal range within the last 12 months    Cholesterol, Total  Date Value Ref Range Status  06/27/2021 192 100 - 199 mg/dL Final   LDL Cholesterol (Calc)  Date Value Ref Range Status  06/11/2017 85 mg/dL (calc) Final    Comment:    Reference range: <100 . Desirable range <100 mg/dL for primary prevention;   <70 mg/dL for patients with CHD or diabetic patients  with > or = 2 CHD risk factors. Marland Kitchen LDL-C is now calculated using the Martin-Hopkins  calculation, which is a validated novel method providing  better accuracy than the Friedewald equation in the  estimation of LDL-C.  Cresenciano Genre et al. Annamaria Helling. 7062;376(28): 2061-2068  (http://education.[facility].com/faq/FAQ164)    LDL Chol Calc (NIH)  Date Value Ref Range Status  06/27/2021 90 0 - 99 mg/dL Final   HDL  Date Value Ref Range Status  06/27/2021 94 >39 mg/dL Final   Triglycerides  Date Value Ref Range Status  06/27/2021 38 0 - 149 mg/dL Final         Passed - Cr in normal range and within 360 days    Creat  Date Value Ref Range Status  06/11/2017 0.76 0.60 - 0.93 mg/dL Final    Comment:    For patients >33 years of age, the reference limit for Creatinine is approximately 13% higher for people identified as African-American. .    Creatinine, Ser  Date Value Ref Range Status  06/27/2021 0.71 0.57 - 1.00 mg/dL Final         Passed - Patient is not pregnant      Passed - Valid encounter within last 12 months    Recent Outpatient Visits           6 months ago Primary hypertension   [facility] Birdie Sons, MD   9 months ago Osteoporosis without current pathological fracture, unspecified  osteoporosis type   [facility] Birdie Sons, MD   11 months ago Annual physical exam   [facility] Birdie Sons, MD   1 year ago Hyperlipidemia, unspecified hyperlipidemia type   [facility] Birdie Sons, MD   1 year ago Primary hypertension   [facility] Birdie Sons, MD       Future Appointments             In 2 weeks Fisher, Kirstie Peri, MD [facility], [place]

## 2022-06-20 ENCOUNTER — Ambulatory Visit: Payer: Medicare PPO | Admitting: Podiatry

## 2022-06-20 ENCOUNTER — Encounter: Payer: Self-pay | Admitting: Podiatry

## 2022-06-20 DIAGNOSIS — B351 Tinea unguium: Secondary | ICD-10-CM | POA: Diagnosis not present

## 2022-06-20 DIAGNOSIS — M79675 Pain in left toe(s): Secondary | ICD-10-CM | POA: Diagnosis not present

## 2022-06-20 DIAGNOSIS — M79674 Pain in right toe(s): Secondary | ICD-10-CM | POA: Diagnosis not present

## 2022-06-20 NOTE — Progress Notes (Signed)
This patient returns to the office for evaluation and treatment of long thick painful nails .  This patient is unable to trim her own nails since the patient cannot reach her feet.  Patient says the nails are painful walking and wearing her shoes.  She  returns for preventive foot care services. .    General Appearance  Alert, conversant and in no acute stress.  Vascular  Dorsalis pedis and posterior tibial  pulses are not  palpable  bilaterally.  Capillary return is within normal limits  bilaterally. Temperature is within normal limits  bilaterally.  Neurologic  Senn-Weinstein monofilament wire test within normal limits  bilaterally. Muscle power within normal limits bilaterally.  Nails Thick disfigured discolored nails with subungual debris  from hallux to fifth toes bilaterally. No evidence of bacterial infection or drainage bilaterally.  Orthopedic  No limitations of motion  feet .  No crepitus or effusions noted.   Fourth toe right is straightened.  Contracted third digit left foot with medial deviation.  Midfoot  DJD  B/L.  Skin  normotropic skin with no porokeratosis noted bilaterally.  No signs of infections or ulcers noted.   Clavi 2 nd toe left foot.  Onychomycosis  Pain in toes right foot  Pain in toes left foot  Skin lesion third toe left foot.  Debridement  of nails  1-5  B/L with a nail nipper.  Nails were then filed using a dremel tool with no incidents.  Debride clavi with # 15 blade.    RTC 3 months    Gardiner Barefoot DPM

## 2022-06-27 NOTE — Progress Notes (Signed)
I,Roshena L Chambers,acting as a scribe for Lelon Huh, MD.,have documented all relevant documentation on the behalf of Lelon Huh, MD,as directed by  Lelon Huh, MD while in the presence of Lelon Huh, MD.    Complete Physical Exam      Patient: Anna Johnson, Female    DOB: Jul 26, 1941, 81 y.o.   MRN: [id number] Visit Date: 06/28/2022  Today's Provider: Lelon Huh, MD   Chief Complaint  Patient presents with   Medicare Wellness   Hyperlipidemia   Hypertension   Subjective    Anna Johnson is a 81 y.o. female who presents today for her complete physical examination.  She reports consuming a general diet. The patient does not participate in regular exercise at present. She generally feels fairly well. She reports sleeping well. She does not have additional problems to discuss today.   HPI Hypertension, follow-up  BP Readings from Last 3 Encounters:  06/28/22 (!) 131/57  12/04/21 136/70  08/20/21 (!) 144/68   Wt Readings from Last 3 Encounters:  06/28/22 115 lb (52.2 kg)  12/04/21 126 lb (57.2 kg)  08/20/21 126 lb (57.2 kg)     She was last seen for hypertension 6 months ago.  BP at that visit was 136/70. Management since that visit includes continue same medication.  She reports good compliance with treatment. She is not having side effects.  She is following a Regular diet. She is not exercising. She does not smoke.  Use of agents associated with hypertension: NSAIDS.   Outside blood pressures are not checked. Symptoms: No chest pain No chest pressure  No palpitations No syncope  No dyspnea No orthopnea  No paroxysmal nocturnal dyspnea No lower extremity edema   Pertinent labs Lab Results  Component Value Date   NA 134 06/27/2021   K 4.4 06/27/2021   CREATININE 0.71 06/27/2021   EGFR 85 06/27/2021   GLUCOSE 95 06/27/2021   TSH 1.000 06/27/2021     The ASCVD Risk score (Arnett DK, et al., 2019) failed to calculate for the following  reasons:   The 2019 ASCVD risk score is only valid for ages 1 to 43  ---------------------------------------------------------------------------------------------------  Lipid/Cholesterol, Follow-up  Last lipid panel Other pertinent labs  Lab Results  Component Value Date   CHOL 192 06/27/2021   HDL 94 06/27/2021   LDLCALC 90 06/27/2021   TRIG 38 06/27/2021   CHOLHDL 2.0 06/27/2021   Lab Results  Component Value Date   ALT 12 06/27/2021   AST 23 06/27/2021   PLT 281 06/27/2021   TSH 1.000 06/27/2021     She was last seen for this more than 1 year ago.    Management since that visit includes continue same medication.  She reports good compliance with treatment. She is not having side effects.   Symptoms: No chest pain No chest pressure/discomfort  No dyspnea No lower extremity edema  No numbness or tingling of extremity No orthopnea  No palpitations No paroxysmal nocturnal dyspnea  No speech difficulty No syncope   Current diet:  regular diet Current exercise: none  The ASCVD Risk score (Arnett DK, et al., 2019) failed to calculate for the following reasons:   The 2019 ASCVD risk score is only valid for ages 49 to 35  ---------------------------------------------------------------------------------------------------    Medications: Outpatient Medications Prior to Visit  Medication Sig   Acetaminophen 500 MG capsule Take 2 capsules by mouth as needed for fever.   alendronate (FOSAMAX) 70 MG tablet TAKE ONE  TABLET EVERY WEEK   amLODipine (NORVASC) 5 MG tablet TAKE 1 TABLET BY MOUTH DAILY   amoxicillin (AMOXIL) 500 MG capsule Take 2,000 mg by mouth as directed. Prior to dental procedures   aspirin 81 MG tablet Take 81 mg by mouth daily.   benazepril (LOTENSIN) 20 MG tablet TAKE 1 TABLET BY MOUTH DAILY. TAKE IN PLACE OF QUINAPRIL.   cholecalciferol (VITAMIN D) 1000 UNITS tablet Take 2,000 Units by mouth daily.    Coenzyme Q10 (CO Q-10) 200 MG CAPS Take 200 mg by  mouth daily.   dorzolamide-timolol (COSOPT) 22.3-6.8 MG/ML ophthalmic solution    fluticasone (FLONASE) 50 MCG/ACT nasal spray USE TWO SPRAYS IN EACH NOSTRIL EACH DAY AS DIRECTED BY PHYSICIAN   hydrochlorothiazide (HYDRODIURIL) 25 MG tablet Take 1 tablet (25 mg total) by mouth daily.   latanoprost (XALATAN) 0.005 % ophthalmic solution Place 1 drop into both eyes at bedtime.   letrozole (FEMARA) 2.5 MG tablet Take 2.5 mg by mouth daily.   loratadine (CLARITIN) 10 MG tablet Take 10 mg by mouth daily as needed for allergies.   lovastatin (MEVACOR) 40 MG tablet TAKE 1 TABLET BY MOUTH AT BEDTIME   LUMIGAN 0.01 % SOLN Place 1 drop into both eyes at bedtime.    metoprolol succinate (TOPROL-XL) 50 MG 24 hr tablet TAKE 1 TABLET BY MOUTH DAILY WITH ANY MEAL OR IMMEDIATELY FOLLOWING A MEAL   timolol (TIMOPTIC) 0.5 % ophthalmic solution Apply to eye.   No facility-administered medications prior to visit.    Allergies  Allergen Reactions   Codeine Other (See Comments)   Seasonal Ic [Cholestatin]    Covera-Hs [Verapamil] Rash    Patient Care Team: Birdie Sons, MD as PCP - General (Family Medicine) Warden Fillers, MD as Consulting Physician (Ophthalmology) Chelsea Aus, DDS as Consulting Physician (Dentistry) Clyde Canterbury, MD as Referring Physician (Otolaryngology) Gardiner Barefoot, DPM as Consulting Physician (Podiatry) Danella Sensing, MD as Consulting Physician (Dermatology)  Review of Systems  Constitutional:  Negative for chills, fatigue and fever.  HENT:  Positive for dental problem. Negative for congestion, ear pain, rhinorrhea, sneezing and sore throat.   Eyes: Negative.  Negative for pain and redness.  Respiratory:  Negative for cough, shortness of breath and wheezing.   Cardiovascular:  Negative for chest pain and leg swelling.  Gastrointestinal:  Negative for abdominal pain, blood in stool, constipation, diarrhea and nausea.  Endocrine: Negative for polydipsia and polyphagia.   Genitourinary: Negative.  Negative for dysuria, flank pain, hematuria, pelvic pain, vaginal bleeding and vaginal discharge.  Musculoskeletal:  Positive for arthralgias and back pain. Negative for gait problem and joint swelling.  Skin:  Negative for rash.  Neurological: Negative.  Negative for dizziness, tremors, seizures, weakness, light-headedness, numbness and headaches.  Hematological:  Negative for adenopathy.  Psychiatric/Behavioral: Negative.  Negative for behavioral problems, confusion and dysphoric mood. The patient is not nervous/anxious and is not hyperactive.         Objective    Vitals: BP (!) 131/57 (BP Location: Left Arm, Patient Position: Sitting, Cuff Size: Normal)   Pulse 63   Temp 97.6 F (36.4 C) (Oral)   Ht 5' 2.01" (1.575 m)   Wt 115 lb (52.2 kg)   SpO2 100% Comment: room air  BMI 21.03 kg/m   Today's Vitals   06/28/22 1031 06/28/22 1038  BP: (!) 130/48 (!) 131/57  Pulse: 64 63  Temp: 97.6 F (36.4 C)   TempSrc: Oral   SpO2: 100%   Weight: 115 lb (52.2  kg)   Height: 5' 2.01" (1.575 m)    Body mass index is 21.03 kg/m.   Physical Exam   General Appearance:    Well developed, well nourished female. Alert, cooperative, in no acute distress, appears stated age   Head:    Normocephalic, without obvious abnormality, atraumatic  Eyes:    PERRL, conjunctiva/corneas clear, EOM's intact, fundi    benign, both eyes  Ears:    Normal TM's and external ear canals, both ears  Nose:   Nares normal, septum midline, mucosa normal, no drainage    or sinus tenderness  Throat:   Lips, mucosa, and tongue normal; teeth and gums normal  Neck:   Supple, symmetrical, trachea midline, no adenopathy;    thyroid:  no enlargement/tenderness/nodules; no carotid   bruit or JVD  Back:     Symmetric, no curvature, ROM normal, no CVA tenderness  Lungs:     Clear to auscultation bilaterally, respirations unlabored  Chest Wall:    No tenderness or deformity   Heart:    Normal  heart rate. Normal rhythm. No murmurs, rubs, or gallops.   Breast Exam:    deferred  Abdomen:     Soft, non-tender, bowel sounds active all four quadrants,    no masses, no organomegaly  Pelvic:    deferred  Extremities:   All extremities are intact. No cyanosis or edema  Pulses:   2+ and symmetric all extremities  Skin:   Skin color, texture, turgor normal, no rashes or lesions  Lymph nodes:   Cervical, supraclavicular, and axillary nodes normal  Neurologic:   CNII-XII intact, normal strength, sensation and reflexes    throughout      Assessment & Plan    Complete physical exam    2. Need for immunization against influenza  - Flu Vaccine QUAD High Dose(Fluad)  3. Malignant neoplasm of 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, unspecified estrogen receptor status ([place]) Followed at [facility].   4. Primary hypertension Well controlled.  Continue current medications.   - CBC - Comprehensive metabolic panel  5. Vitamin D deficiency  - VITAMIN D 25 Hydroxy (Vit-D Deficiency, Fractures)  6. Osteoporosis without current pathological fracture, unspecified osteoporosis type Back on alendronage. Check BMD 10/2023  7. Hyperlipidemia, unspecified hyperlipidemia type She is tolerating lovastatin well with no adverse effects.   - Lipid panel     The entirety of the information documented in the History of Present Illness, Review of Systems and Physical Exam were personally obtained by me. Portions of this information were initially documented by the CMA and reviewed by me for thoroughness and accuracy.     Lelon Huh, MD  [facility] [phone] (phone) [phone] (fax)  [place]

## 2022-06-28 ENCOUNTER — Encounter: Payer: Self-pay | Admitting: Family Medicine

## 2022-06-28 ENCOUNTER — Ambulatory Visit (INDEPENDENT_AMBULATORY_CARE_PROVIDER_SITE_OTHER): Payer: Medicare PPO | Admitting: Family Medicine

## 2022-06-28 VITALS — BP 131/57 | HR 63 | Temp 97.6°F | Ht 62.01 in | Wt 115.0 lb

## 2022-06-28 DIAGNOSIS — M810 Age-related osteoporosis without current pathological fracture: Secondary | ICD-10-CM

## 2022-06-28 DIAGNOSIS — E785 Hyperlipidemia, unspecified: Secondary | ICD-10-CM | POA: Diagnosis not present

## 2022-06-28 DIAGNOSIS — I10 Essential (primary) hypertension: Secondary | ICD-10-CM

## 2022-06-28 DIAGNOSIS — Z23 Encounter for immunization: Secondary | ICD-10-CM | POA: Diagnosis not present

## 2022-06-28 DIAGNOSIS — C50212 Malignant neoplasm of 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: Secondary | ICD-10-CM

## 2022-06-28 DIAGNOSIS — E559 Vitamin D deficiency, unspecified: Secondary | ICD-10-CM | POA: Diagnosis not present

## 2022-06-28 DIAGNOSIS — Z0000 Encounter for general adult medical examination without abnormal findings: Secondary | ICD-10-CM | POA: Diagnosis not present

## 2022-06-28 NOTE — Patient Instructions (Addendum)
Please review the attached list of medications and notify my office if there are any errors.   Please bring all of your medications to every appointment so we can make sure that our medication list is the same as yours.   I recommend getting the updated Covid vaccine at your local pharmacy

## 2022-06-28 NOTE — Progress Notes (Signed)
Annual Wellness Visit     Patient: Anna Johnson, Female    DOB: Dec 04, 1940, 81 y.o.   MRN: [id number] Visit Date: 06/28/2022  Today's Provider: Lelon Huh, MD   Chief Complaint  Patient presents with   Medicare Wellness   Hyperlipidemia   Hypertension   Subjective    Anna Johnson is a 81 y.o. female who presents today for her Annual Wellness Visit.   Medications: Outpatient Medications Prior to Visit  Medication Sig   Acetaminophen 500 MG capsule Take 2 capsules by mouth as needed for fever.   alendronate (FOSAMAX) 70 MG tablet TAKE ONE TABLET EVERY WEEK   amLODipine (NORVASC) 5 MG tablet TAKE 1 TABLET BY MOUTH DAILY   amoxicillin (AMOXIL) 500 MG capsule Take 2,000 mg by mouth as directed. Prior to dental procedures   aspirin 81 MG tablet Take 81 mg by mouth daily.   benazepril (LOTENSIN) 20 MG tablet TAKE 1 TABLET BY MOUTH DAILY. TAKE IN PLACE OF QUINAPRIL.   cholecalciferol (VITAMIN D) 1000 UNITS tablet Take 2,000 Units by mouth daily.    Coenzyme Q10 (CO Q-10) 200 MG CAPS Take 200 mg by mouth daily.   dorzolamide-timolol (COSOPT) 22.3-6.8 MG/ML ophthalmic solution    fluticasone (FLONASE) 50 MCG/ACT nasal spray USE TWO SPRAYS IN EACH NOSTRIL EACH DAY AS DIRECTED BY PHYSICIAN   hydrochlorothiazide (HYDRODIURIL) 25 MG tablet Take 1 tablet (25 mg total) by mouth daily.   latanoprost (XALATAN) 0.005 % ophthalmic solution Place 1 drop into both eyes at bedtime.   letrozole (FEMARA) 2.5 MG tablet Take 2.5 mg by mouth daily.   loratadine (CLARITIN) 10 MG tablet Take 10 mg by mouth daily as needed for allergies.   lovastatin (MEVACOR) 40 MG tablet TAKE 1 TABLET BY MOUTH AT BEDTIME   LUMIGAN 0.01 % SOLN Place 1 drop into both eyes at bedtime.    metoprolol succinate (TOPROL-XL) 50 MG 24 hr tablet TAKE 1 TABLET BY MOUTH DAILY WITH ANY MEAL OR IMMEDIATELY FOLLOWING A MEAL   timolol (TIMOPTIC) 0.5 % ophthalmic solution Apply to eye.   No facility-administered  medications prior to visit.    Allergies  Allergen Reactions   Codeine Other (See Comments)   Seasonal Ic [Cholestatin]    Covera-Hs [Verapamil] Rash    Patient Care Team: Birdie Sons, MD as PCP - General (Family Medicine) Warden Fillers, MD as Consulting Physician (Ophthalmology) Chelsea Aus, DDS as Consulting Physician (Dentistry) Clyde Canterbury, MD as Referring Physician (Otolaryngology) Gardiner Barefoot, DPM as Consulting Physician (Podiatry) Danella Sensing, MD as Consulting Physician (Dermatology)      Objective     Most recent functional status assessment:    06/28/2022   10:36 AM  In your present state of health, do you have any difficulty performing the following activities:  Hearing? 0  Vision? 0  Difficulty concentrating or making decisions? 0  Walking or climbing stairs? 0  Dressing or bathing? 0  Doing errands, shopping? 0   Most recent fall risk assessment:    06/28/2022   10:36 AM  Fall Risk   Falls in the past year? 0  Number falls in past yr: 0  Injury with Fall? 0  Risk for fall due to : No Fall Risks  Follow up Falls evaluation completed    Most recent depression screenings:    06/28/2022   10:35 AM 12/04/2021   11:06 AM  PHQ 2/9 Scores  PHQ - 2 Score 0 0  PHQ- 9 Score  0 1   Most recent cognitive screening:    12/28/2019   10:49 AM  6CIT Screen  What Year? 0 points  What month? 0 points  What time? 0 points  Count back from 20 0 points  Months in reverse 0 points  Repeat phrase 0 points  Total Score 0 points   Most recent Audit-C alcohol use screening    06/28/2022   10:36 AM  Alcohol Use Disorder Test (AUDIT)  1. How often do you have a drink containing alcohol? 0  2. How many drinks containing alcohol do you have on a typical day when you are drinking? 0  3. How often do you have six or more drinks on one occasion? 0  AUDIT-C Score 0   A score of 3 or more in women, and 4 or more in men indicates increased risk for  alcohol abuse, EXCEPT if all of the points are from question 1   No results found for any visits on 06/28/22.  Assessment & Plan     Annual wellness visit done today including the all of the following: Reviewed patient's Family Medical History Reviewed and updated list of patient's medical providers Assessment of cognitive impairment was done Assessed patient's functional ability Established a written schedule for health screening [place] Completed and Reviewed  Exercise Activities and Dietary recommendations  Goals      DIET - INCREASE WATER INTAKE     Recommend increasing water intake to 4-6 glasses a day.       Exercise     Recommend increasing exercise/activity. Pt to start walking 2 days a week for 45 minutes.          Immunization History  Administered Date(s) Administered   Fluad Quad(high Dose 65+) 05/26/2019, 06/20/2020, 06/27/2021, 06/28/2022   Influenza Split 04/29/2011, 05/04/2012   Influenza, High Dose Seasonal PF 05/08/2015, 05/30/2016, 06/10/2017, 07/01/2018   Influenza,inj,Quad PF,6+ Mos 05/03/2013, 04/27/2014   PFIZER Comirnaty(Gray Top)Covid-19 Tri-Sucrose Vaccine 09/17/2019, 10/08/2019   PFIZER(Purple Top)SARS-COV-2 Vaccination 09/17/2019, 10/08/2019, 07/19/2020   Pneumococcal Conjugate-13 11/03/2013   Pneumococcal Polysaccharide-23 06/04/2008, 07/01/2018   Td 04/13/2008   Tdap 04/29/2011   Zoster, Live 04/13/2008    Health Maintenance  Topic Date Due   Zoster Vaccines- Shingrix (1 of 2) Never done   COVID-19 Vaccine (6 - Pfizer risk series) 09/13/2020   Medicare Annual Wellness (AWV)  06/27/2022   DEXA SCAN  11/07/2023   Pneumonia Vaccine 14+ Years old  Completed   INFLUENZA VACCINE  Completed   HPV VACCINES  Aged Out     Discussed health benefits of physical activity, and encouraged her to engage in regular exercise appropriate for her age and condition.           Lelon Huh, MD  [facility] [phone] (phone) [phone] (fax)  [place]

## 2022-06-29 LAB — CBC
Hematocrit: 38.9 % (ref 34.0–46.6)
Hemoglobin: 12.7 g/dL (ref 11.1–15.9)
MCH: 32.3 pg (ref 26.6–33.0)
MCHC: 32.6 g/dL (ref 31.5–35.7)
MCV: 99 fL — ABNORMAL HIGH (ref 79–97)
Platelets: 256 10*3/uL (ref 150–450)
RBC: 3.93 x10E6/uL (ref 3.77–5.28)
RDW: 11.9 % (ref 11.7–15.4)
WBC: 5.9 10*3/uL (ref 3.4–10.8)

## 2022-06-29 LAB — COMPREHENSIVE METABOLIC PANEL
ALT: 11 IU/L (ref 0–32)
AST: 22 IU/L (ref 0–40)
Albumin/Globulin Ratio: 1.8 (ref 1.2–2.2)
Albumin: 4.3 g/dL (ref 3.7–4.7)
Alkaline Phosphatase: 43 IU/L — ABNORMAL LOW (ref 44–121)
BUN/Creatinine Ratio: 21 (ref 12–28)
BUN: 20 mg/dL (ref 8–27)
Bilirubin Total: 0.6 mg/dL (ref 0.0–1.2)
CO2: 25 mmol/L (ref 20–29)
Calcium: 10 mg/dL (ref 8.7–10.3)
Chloride: 97 mmol/L (ref 96–106)
Creatinine, Ser: 0.95 mg/dL (ref 0.57–1.00)
Globulin, Total: 2.4 g/dL (ref 1.5–4.5)
Glucose: 89 mg/dL (ref 70–99)
Potassium: 4.3 mmol/L (ref 3.5–5.2)
Sodium: 138 mmol/L (ref 134–144)
Total Protein: 6.7 g/dL (ref 6.0–8.5)
eGFR: 60 mL/min/{1.73_m2} (ref >59)

## 2022-06-29 LAB — LIPID PANEL
Chol/HDL Ratio: 1.9 ratio (ref 0.0–4.4)
Cholesterol, Total: 174 mg/dL (ref 100–199)
HDL: 93 mg/dL (ref >39)
LDL Chol Calc (NIH): 72 mg/dL (ref 0–99)
Triglycerides: 44 mg/dL (ref 0–149)
VLDL Cholesterol Cal: 9 mg/dL (ref 5–40)

## 2022-06-29 LAB — VITAMIN D 25 HYDROXY (VIT D DEFICIENCY, FRACTURES): Vit D, 25-Hydroxy: 51.3 ng/mL (ref 30.0–100.0)

## 2022-07-08 ENCOUNTER — Other Ambulatory Visit: Payer: Self-pay | Admitting: Family Medicine

## 2022-07-08 DIAGNOSIS — I10 Essential (primary) hypertension: Secondary | ICD-10-CM

## 2022-07-15 ENCOUNTER — Encounter: Payer: Self-pay | Admitting: Podiatry

## 2022-07-16 ENCOUNTER — Other Ambulatory Visit: Payer: Self-pay | Admitting: Family Medicine

## 2022-07-16 DIAGNOSIS — I10 Essential (primary) hypertension: Secondary | ICD-10-CM

## 2022-07-16 NOTE — Telephone Encounter (Signed)
Refilled 06/28/2022 #[id number] Requested Prescriptions  Pending Prescriptions Disp Refills   hydrochlorothiazide (HYDRODIURIL) 25 MG tablet [Pharmacy Med Name: HYDROCHLOROTHIAZIDE 25 MG TAB] 90 tablet 4    Sig: TAKE 1 TABLET BY MOUTH DAILY     Cardiovascular: Diuretics - Thiazide Passed - 07/16/2022  1:27 PM      Passed - Cr in normal range and within 180 days    Creat  Date Value Ref Range Status  06/11/2017 0.76 0.60 - 0.93 mg/dL Final    Comment:    For patients >45 years of age, the reference limit for Creatinine is approximately 13% higher for people identified as African-American. .    Creatinine, Ser  Date Value Ref Range Status  06/28/2022 0.95 0.57 - 1.00 mg/dL Final         Passed - K in normal range and within 180 days    Potassium  Date Value Ref Range Status  06/28/2022 4.3 3.5 - 5.2 mmol/L Final         Passed - Na in normal range and within 180 days    Sodium  Date Value Ref Range Status  06/28/2022 138 134 - 144 mmol/L Final         Passed - Last BP in normal range    BP Readings from Last 1 Encounters:  06/28/22 (!) 131/57         Passed - Valid encounter within last 6 months    Recent Outpatient Visits           2 weeks ago Annual physical exam   [place], [place]   7 months ago Primary hypertension   [facility] Birdie Sons, MD   11 months ago Osteoporosis without current pathological fracture, unspecified osteoporosis type   [facility] Birdie Sons, MD   1 year ago Annual physical exam   [facility] Birdie Sons, MD   1 year ago Hyperlipidemia, unspecified hyperlipidemia type   [facility] Birdie Sons, MD       Future Appointments             In 5 months Fisher, Kirstie Peri, MD [facility], [place]

## 2022-07-17 ENCOUNTER — Other Ambulatory Visit: Payer: Self-pay | Admitting: Family Medicine

## 2022-07-17 DIAGNOSIS — I10 Essential (primary) hypertension: Secondary | ICD-10-CM

## 2022-07-17 MED ORDER — HYDROCHLOROTHIAZIDE 25 MG PO TABS: 25.0000 mg | ORAL_TABLET | Freq: Every day | ORAL | 4 refills | Status: DC

## 2022-09-03 DIAGNOSIS — H401131 Primary open-angle glaucoma, bilateral, mild stage: Secondary | ICD-10-CM | POA: Diagnosis not present

## 2022-09-18 ENCOUNTER — Other Ambulatory Visit: Payer: Self-pay | Admitting: Family Medicine

## 2022-09-18 DIAGNOSIS — E785 Hyperlipidemia, unspecified: Secondary | ICD-10-CM

## 2022-09-18 NOTE — Telephone Encounter (Signed)
Requested Prescriptions  Pending Prescriptions Disp Refills   lovastatin (MEVACOR) 40 MG tablet [Pharmacy Med Name: LOVASTATIN 40 MG TAB] 90 tablet 0    Sig: TAKE 1 TABLET BY MOUTH AT BEDTIME     Cardiovascular:  Antilipid - Statins 2 Failed - 09/18/2022  9:33 AM      Failed - Lipid Panel in normal range within the last 12 months    Cholesterol, Total  Date Value Ref Range Status  06/28/2022 174 100 - 199 mg/dL Final   LDL Cholesterol (Calc)  Date Value Ref Range Status  06/11/2017 85 mg/dL (calc) Final    Comment:    Reference range: <100 . Desirable range <100 mg/dL for primary prevention;   <70 mg/dL for patients with CHD or diabetic patients  with > or = 2 CHD risk factors. Marland Kitchen LDL-C is now calculated using the Martin-Hopkins  calculation, which is a validated novel method providing  better accuracy than the Friedewald equation in the  estimation of LDL-C.  Cresenciano Genre et al. Annamaria Helling. 6767;209(47): 2061-2068  (http://education.[employer].com/faq/FAQ164)    LDL Chol Calc (NIH)  Date Value Ref Range Status  06/28/2022 72 0 - 99 mg/dL Final   HDL  Date Value Ref Range Status  06/28/2022 93 >39 mg/dL Final   Triglycerides  Date Value Ref Range Status  06/28/2022 44 0 - 149 mg/dL Final         Passed - Cr in normal range and within 360 days    Creat  Date Value Ref Range Status  06/11/2017 0.76 0.60 - 0.93 mg/dL Final    Comment:    For patients >77 years of age, the reference limit for Creatinine is approximately 13% higher for people identified as African-American. .    Creatinine, Ser  Date Value Ref Range Status  06/28/2022 0.95 0.57 - 1.00 mg/dL Final         Passed - Patient is not pregnant      Passed - Valid encounter within last 12 months    Recent Outpatient Visits           2 months ago Annual physical exam   [place] Birdie Sons, MD   9 months ago Primary hypertension   [place] Birdie Sons, MD   1 year ago Osteoporosis without current pathological fracture, unspecified osteoporosis type   [place], Donald E, MD   1 year ago Annual physical exam   [facility] Birdie Sons, MD   1 year ago Hyperlipidemia, unspecified hyperlipidemia type   [facility] Birdie Sons, MD       Future Appointments             In 3 months Fisher, Kirstie Peri, MD [facility], [place]

## 2022-09-19 ENCOUNTER — Ambulatory Visit: Payer: Medicare PPO | Admitting: Podiatry

## 2022-09-26 DIAGNOSIS — Z85828 Personal history of other malignant neoplasm of skin: Secondary | ICD-10-CM | POA: Diagnosis not present

## 2022-09-26 DIAGNOSIS — L820 Inflamed seborrheic keratosis: Secondary | ICD-10-CM | POA: Diagnosis not present

## 2022-09-26 DIAGNOSIS — L821 Other seborrheic keratosis: Secondary | ICD-10-CM | POA: Diagnosis not present

## 2022-09-26 DIAGNOSIS — D225 Melanocytic nevi of trunk: Secondary | ICD-10-CM | POA: Diagnosis not present

## 2022-09-26 DIAGNOSIS — L905 Scar conditions and fibrosis of skin: Secondary | ICD-10-CM | POA: Diagnosis not present

## 2022-10-03 ENCOUNTER — Ambulatory Visit: Payer: Medicare PPO | Admitting: Podiatry

## 2022-10-03 ENCOUNTER — Encounter: Payer: Self-pay | Admitting: Podiatry

## 2022-10-03 VITALS — BP 137/59 | HR 58

## 2022-10-03 DIAGNOSIS — M79675 Pain in left toe(s): Secondary | ICD-10-CM | POA: Diagnosis not present

## 2022-10-03 DIAGNOSIS — M2041 Other hammer toe(s) (acquired), right foot: Secondary | ICD-10-CM | POA: Diagnosis not present

## 2022-10-03 DIAGNOSIS — L089 Local infection of the skin and subcutaneous tissue, unspecified: Secondary | ICD-10-CM | POA: Diagnosis not present

## 2022-10-03 DIAGNOSIS — M79674 Pain in right toe(s): Secondary | ICD-10-CM

## 2022-10-03 DIAGNOSIS — L98491 Non-pressure chronic ulcer of skin of other sites limited to breakdown of skin: Secondary | ICD-10-CM

## 2022-10-03 DIAGNOSIS — M2042 Other hammer toe(s) (acquired), left foot: Secondary | ICD-10-CM | POA: Diagnosis not present

## 2022-10-03 DIAGNOSIS — B351 Tinea unguium: Secondary | ICD-10-CM | POA: Diagnosis not present

## 2022-10-03 NOTE — Progress Notes (Signed)
This patient returns to the office for evaluation and treatment of long thick painful nails .  She says she has been applying a gel soaked pad to her second toe left foot.  She says she felt intense pain at the site of the pad .  She previously had surgery to this toe according to the patient.  She presents for treatment of her second toe and nail treatment.   General Appearance  Alert, conversant and in no acute stress.  Vascular  Dorsalis pedis and posterior tibial  pulses are not  palpable  bilaterally.  Capillary return is within normal limits  bilaterally. Temperature is within normal limits  bilaterally.  Neurologic  Senn-Weinstein monofilament wire test within normal limits  bilaterally. Muscle power within normal limits bilaterally.  Nails Thick disfigured discolored nails with subungual debris  from hallux to fifth toes bilaterally. No evidence of bacterial infection or drainage bilaterally.  Orthopedic  No limitations of motion  feet .  No crepitus or effusions noted.   Fourth toe right is straightened.  Contracted third digit left foot with medial deviation.  Midfoot  DJD  B/L.  Skin  normotropic skin with no porokeratosis noted bilaterally.  No signs of infections or ulcers noted.   Clavi 2 nd toe left foot.  S/P burn clavi second toe left foot.   Onychomycosis  B/L.  ROV  Discussed the pad she applied and told her it had salicylic acid which led to a chemical burn and the intense pain.  Debride necrotic tissue second toe.  Told her to stop using the acid on her toe.  Debridement  of nails  1-5  B/L with a nail nipper.  Nails were then filed using a dremel tool with no incidents. Told her if toe problems persist she needs to make an appointment with one of the surgical doctors.      RTC 3 months    Gardiner Barefoot DPM

## 2022-10-24 ENCOUNTER — Ambulatory Visit: Payer: Medicare PPO | Admitting: Podiatry

## 2022-10-24 ENCOUNTER — Encounter: Payer: Self-pay | Admitting: Podiatry

## 2022-10-24 VITALS — BP 121/64 | HR 55

## 2022-10-24 DIAGNOSIS — M205X2 Other deformities of toe(s) (acquired), left foot: Secondary | ICD-10-CM | POA: Diagnosis not present

## 2022-10-24 NOTE — Progress Notes (Signed)
  Subjective:  Patient ID: Anna Johnson, female    DOB: 1941/03/17,  MRN: [id number]  Chief Complaint  Patient presents with   Foot Pain    "My second toe hurts all the time."   82 y.o. female returns today for planned flexor tenotomy of the second left digit digit.  Objective:   Vitals:   10/24/22 1026  BP: 121/64  Pulse: (!) 55    General AA&O x3. Normal mood and affect.  Vascular Pedal pulses palpable.  Neurologic Epicritic sensation grossly intact.  Dermatologic Pre-ulcerative callus at the tip of the left, second toe  Orthopedic: Semi-reducible hammertoe deformity left, second toe    Assessment & Plan:  Patient was evaluated and treated and all questions answered.  Hammertoe second left with pre-ulcerative callus -Flexor tenotomy as below. -Advised to remove the dressing in 24 hours and apply a band-aid and triple abx ointment every day thereafter.  Procedure: Flexor Tenotomy Indication for Procedure: toe with semi-reducible hammertoe with distal tip ulceration. Flexor tenotomy indicated to alleviate contracture, reduce pressure, and enhance healing of the ulceration. Location: left, second Anesthesia: Lidocaine 1% plain; 1.5 mL and Marcaine 0.5% plain; 1.5 mL digital block Instrumentation: #11 handle Technique: The toe was anesthetized as above and prepped in the usual fashion. The toe was exsanquinated and a tourniquet was secured at the base of the toe. An #11 blade was then used to percutaneously release the flexor tendon at the plantar surface of the toe with noted release of the hammertoe deformity.  3-0 Prolene was used to close incision.  The incision was then dressed with antibiotic ointment and band-aid. Compression splint dressing applied. Patient tolerated the procedure well. Dressing: Dry, sterile, compression dressing. Disposition: Patient tolerated procedure well. Patient to return in 1 week for follow-up.      No follow-ups on file.

## 2022-11-05 ENCOUNTER — Ambulatory Visit (INDEPENDENT_AMBULATORY_CARE_PROVIDER_SITE_OTHER): Payer: Medicare PPO | Admitting: Podiatry

## 2022-11-05 ENCOUNTER — Encounter: Payer: Self-pay | Admitting: Podiatry

## 2022-11-05 VITALS — BP 121/42 | HR 55

## 2022-11-05 DIAGNOSIS — M205X2 Other deformities of toe(s) (acquired), left foot: Secondary | ICD-10-CM

## 2022-11-08 NOTE — Progress Notes (Signed)
  Subjective:  Patient ID: Anna Johnson, female    DOB: [birth year],  MRN: [id number]  Chief Complaint  Patient presents with   Routine Post Op    "It's doing so much better.  He said he was going to take the stitches out today."   82 y.o. female returns today for follow-up from left second digit flexor tenotomy.  She states she is doing well.  She is here to get her stitches out  Objective:   Vitals:   11/05/22 1019  BP: (!) 121/42  Pulse: (!) 55    General AA&O x3. Normal mood and affect.  Vascular Pedal pulses palpable.  Neurologic Epicritic sensation grossly intact.  Dermatologic No further pre-ulcerative callus at the tip of the left, second toe  Orthopedic: No further semi-reducible hammertoe deformity left, second toe    Assessment & Plan:  Patient was evaluated and treated and all questions answered.  Hammertoe second left with pre-ulcerative callus -Good correction alignment noted.  Stitches were removed.  No complication noted no dehiscence noted.  Rectus second digit noted.      No follow-ups on file.

## 2022-11-11 ENCOUNTER — Other Ambulatory Visit: Payer: Self-pay | Admitting: Family Medicine

## 2022-11-11 DIAGNOSIS — I10 Essential (primary) hypertension: Secondary | ICD-10-CM

## 2022-11-12 NOTE — Telephone Encounter (Signed)
Requested Prescriptions  Pending Prescriptions Disp Refills   benazepril (LOTENSIN) 20 MG tablet [Pharmacy Med Name: BENAZEPRIL HCL 20 MG TAB] 90 tablet 0    Sig: TAKE 1 TABLET BY MOUTH DAILY. TAKE IN PLACE OF QUINAPRIL.     Cardiovascular:  ACE Inhibitors Failed - 11/11/2022  3:46 PM      Failed - Last BP in normal range    BP Readings from Last 1 Encounters:  11/05/22 (!) 121/42         Passed - Cr in normal range and within 180 days    Creat  Date Value Ref Range Status  06/11/2017 0.76 0.60 - 0.93 mg/dL Final    Comment:    For patients >46 years of age, the reference limit for Creatinine is approximately 13% higher for people identified as African-American. .    Creatinine, Ser  Date Value Ref Range Status  06/28/2022 0.95 0.57 - 1.00 mg/dL Final         Passed - K in normal range and within 180 days    Potassium  Date Value Ref Range Status  06/28/2022 4.3 3.5 - 5.2 mmol/L Final         Passed - Patient is not pregnant      Passed - Valid encounter within last 6 months    Recent Outpatient Visits           4 months ago Annual physical exam   [place] Birdie Sons, MD   11 months ago Primary hypertension   [place], Donald E, MD   1 year ago Osteoporosis without current pathological fracture, unspecified osteoporosis type   [place], Donald E, MD   1 year ago Annual physical exam   [place], Donald E, MD   1 year ago Hyperlipidemia, unspecified hyperlipidemia type   [facility] Birdie Sons, MD       Future Appointments             In 1 month Fisher, Kirstie Peri, MD [facility] [place]

## 2022-12-16 ENCOUNTER — Other Ambulatory Visit: Payer: Self-pay | Admitting: Family Medicine

## 2022-12-16 DIAGNOSIS — M810 Age-related osteoporosis without current pathological fracture: Secondary | ICD-10-CM

## 2022-12-16 DIAGNOSIS — E785 Hyperlipidemia, unspecified: Secondary | ICD-10-CM

## 2022-12-19 ENCOUNTER — Ambulatory Visit: Payer: Medicare PPO | Admitting: Podiatry

## 2022-12-24 ENCOUNTER — Ambulatory Visit: Payer: Medicare PPO | Admitting: Family Medicine

## 2022-12-27 ENCOUNTER — Ambulatory Visit: Payer: Medicare PPO | Admitting: Podiatry

## 2022-12-30 ENCOUNTER — Ambulatory Visit: Payer: Medicare PPO | Admitting: Podiatry

## 2022-12-30 DIAGNOSIS — M79674 Pain in right toe(s): Secondary | ICD-10-CM | POA: Diagnosis not present

## 2022-12-30 DIAGNOSIS — B351 Tinea unguium: Secondary | ICD-10-CM

## 2022-12-30 DIAGNOSIS — M79675 Pain in left toe(s): Secondary | ICD-10-CM | POA: Diagnosis not present

## 2022-12-30 NOTE — Progress Notes (Unsigned)
  Subjective:  Patient ID: Anna Johnson, female    DOB: 26-Apr-1941,  MRN: [id number]  Anna Johnson presents to clinic today for {jgcomplaint:23593}  Chief Complaint  Patient presents with   Nail Problem    RFC, QMV:HQIONGEXB Provider Malva Limes, MD,LOV:11/23      New problem(s): None. {jgcomplaint:23593}  PCP is Malva Limes, MD.  Allergies  Allergen Reactions   Codeine Other (See Comments)   Seasonal Ic [Cholestatin]    Covera-Hs [Verapamil] Rash    Review of Systems: Negative except as noted in the HPI.  Objective: No changes noted in today's physical examination. There were no vitals filed for this visit. Anna Johnson is a pleasant 82 y.o. female {jgbodyhabitus:24098} AAO x 3.  Vascular Examination: CFT <4 seconds b/l. DP pulses diminished b/l. PT pulses diminished b/l. Digital hair absent. Skin temperature gradient warm to cool b/l. No ischemia or gangrene. No cyanosis or clubbing noted b/l. {jgvascular:23595}   Neurological Examination: Sensation grossly intact b/l with 10 gram monofilament. Vibratory sensation intact b/l. {jgneuro:23601::"Protective sensation intact 5/5 intact bilaterally with 10g monofilament b/l.","Vibratory sensation intact b/l.","Proprioception intact bilaterally."}  Dermatological Examination: Pedal skin thin, shiny and atrophic b/l. No open wounds. No interdigital macerations.   Toenails 1-5 b/l thick, discolored, elongated with subungual debris and pain on dorsal palpation.   {jgderm:23598}  Musculoskeletal Examination: Muscle strength 5/5 to b/l LE. {jgmsk:23600}  Radiographs: None  {jgxrayfindings:23683}  Last A1c:       No data to display         Assessment/Plan: No diagnosis found.  No orders of the defined types were placed in this encounter.   None {Jgplan:23602::"-Patient/POA to call should there be question/concern in the interim."}   Return in about 3 months (around 04/01/2023).  Freddie Breech, DPM

## 2023-01-01 ENCOUNTER — Encounter: Payer: Self-pay | Admitting: Podiatry

## 2023-01-01 ENCOUNTER — Ambulatory Visit: Payer: Medicare PPO | Admitting: Family Medicine

## 2023-01-01 ENCOUNTER — Encounter: Payer: Self-pay | Admitting: Family Medicine

## 2023-01-01 VITALS — BP 115/56 | HR 54 | Temp 97.8°F | Resp 14 | Wt 114.0 lb

## 2023-01-01 DIAGNOSIS — R5383 Other fatigue: Secondary | ICD-10-CM

## 2023-01-01 DIAGNOSIS — C50212 Malignant neoplasm of 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: Secondary | ICD-10-CM

## 2023-01-01 DIAGNOSIS — I10 Essential (primary) hypertension: Secondary | ICD-10-CM

## 2023-01-01 NOTE — Progress Notes (Addendum)
Established patient visit   I,Roshena L Chambers,acting as a scribe for Mila Merry, MD.,have documented all relevant documentation on the behalf of Mila Merry, MD,as directed by  Mila Merry, MD  Patient: Anna Johnson   DOB: [birth year]   82 y.o. Female  MRN: [id number] Visit Date: 01/01/2023  Today's healthcare provider: Mila Merry, MD   Chief Complaint  Patient presents with   Hypertension   Subjective    HPI  Hypertension, follow-up  BP Readings from Last 3 Encounters:  01/01/23 (!) 115/56  11/05/22 (!) 121/42  10/24/22 121/64   Wt Readings from Last 3 Encounters:  01/01/23 114 lb (51.7 kg)  06/28/22 115 lb (52.2 kg)  12/04/21 126 lb (57.2 kg)     She was last seen for hypertension 6 months ago.  BP at that visit was 131/57. Management since that visit includes continuing same medication.  She reports good compliance with treatment. She is not having side effects.  She is following a Regular diet. She is not exercising. She does not smoke.  Use of agents associated with hypertension: NSAIDS.   Outside blood pressures are not checked. Symptoms: No chest pain No chest pressure  No palpitations No syncope  No dyspnea No orthopnea  No paroxysmal nocturnal dyspnea No lower extremity edema   Pertinent labs Lab Results  Component Value Date   CHOL 174 06/28/2022   HDL 93 06/28/2022   LDLCALC 72 06/28/2022   TRIG 44 06/28/2022   CHOLHDL 1.9 06/28/2022   Lab Results  Component Value Date   NA 138 06/28/2022   K 4.3 06/28/2022   CREATININE 0.95 06/28/2022   EGFR 60 06/28/2022   GLUCOSE 89 06/28/2022   TSH 1.000 06/27/2021     The ASCVD Risk score (Arnett DK, et al., 2019) failed to calculate for the following reasons:   The 2019 ASCVD risk score is only valid for ages 63 to 72  She does report being more tired lately which she thinks may be related to taking care of her husband who is having memory problems.   ---------------------------------------------------------------------------------------------------  She also reports she starting developing mild chest congestion and runny nose yesterday. Is taking OTC guaifenesin which is helping   Medications: Outpatient Medications Prior to Visit  Medication Sig   Acetaminophen 500 MG capsule Take 2 capsules by mouth as needed for fever.   alendronate (FOSAMAX) 70 MG tablet TAKE 1 TABLET BY MOUTH WEEKLY. TAKE IN THE MORNING WITH 8 OZ OF WATER. DO NOT EAT, DRINK OR LIE DOWN FOR 30 MIN AFTER.   amLODipine (NORVASC) 5 MG tablet TAKE 1 TABLET BY MOUTH DAILY   amoxicillin (AMOXIL) 500 MG capsule Take 2,000 mg by mouth as directed. Prior to dental procedures   aspirin 81 MG tablet Take 81 mg by mouth daily.   benazepril (LOTENSIN) 20 MG tablet TAKE 1 TABLET BY MOUTH DAILY. TAKE IN PLACE OF QUINAPRIL.   cholecalciferol (VITAMIN D) 1000 UNITS tablet Take 2,000 Units by mouth daily.    Coenzyme Q10 (CO Q-10) 200 MG CAPS Take 200 mg by mouth daily.   dorzolamide-timolol (COSOPT) 22.3-6.8 MG/ML ophthalmic solution    fluticasone (FLONASE) 50 MCG/ACT nasal spray USE TWO SPRAYS IN EACH NOSTRIL EACH DAY AS DIRECTED BY PHYSICIAN   hydrochlorothiazide (HYDRODIURIL) 25 MG tablet Take 1 tablet (25 mg total) by mouth daily.   latanoprost (XALATAN) 0.005 % ophthalmic solution Place 1 drop into both eyes at bedtime.   letrozole (FEMARA) 2.5 MG  tablet Take 2.5 mg by mouth daily.   loratadine (CLARITIN) 10 MG tablet Take 10 mg by mouth daily as needed for allergies.   lovastatin (MEVACOR) 40 MG tablet TAKE 1 TABLET BY MOUTH AT BEDTIME   LUMIGAN 0.01 % SOLN Place 1 drop into both eyes at bedtime.    metoprolol succinate (TOPROL-XL) 50 MG 24 hr tablet TAKE 1 TABLET BY MOUTH DAILY WITH ANY MEAL OR IMMEDIATELY FOLLOWING A MEAL   timolol (TIMOPTIC) 0.5 % ophthalmic solution Apply to eye.   No facility-administered medications prior to visit.    Review of Systems   Constitutional:  Negative for appetite change, chills, fatigue and fever.  Respiratory:  Negative for chest tightness and shortness of breath.   Cardiovascular:  Negative for chest pain and palpitations.  Gastrointestinal:  Negative for abdominal pain, nausea and vomiting.  Neurological:  Negative for dizziness and weakness.       Objective    BP (!) 115/56   Pulse (!) 54   Temp 97.8 F (36.6 C) (Oral)   Resp 14   Wt 114 lb (51.7 kg)   BMI 20.85 kg/m   Physical Exam   General: Appearance:    Well developed, well nourished female in no acute distress  Eyes:    PERRL, conjunctiva/corneas clear, EOM's intact       Lungs:     Clear to auscultation bilaterally, respirations unlabored  Heart:    Bradycardic. Normal rhythm. No murmurs, rubs, or gallops.    MS:   All extremities are intact.    Neurologic:   Awake, alert, oriented x 3. No apparent focal neurological defect.         Assessment & Plan     1. Primary hypertension A bit hypotensive and fatigued. Will reduce amlodipine to 2.5mg  when refill is due at the end of next week.   Future Appointments  Date Time Provider Department Center  03/20/2023 10:30 AM Freddie Breech, DPM [facility]  06/19/2023 10:30 AM Freddie Breech, DPM [facility]  07/02/2023 10:00 AM Malva Limes, MD [facility]     2. Other fatigue She relates this to caring for her husband who sounds like he has early dementia, suspect hypotension may be playing a role as above.   3. Malignant neoplasm of 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, unspecified estrogen receptor status (HCC) On 5th year of letrozole per [facility] where she is due follow up in August.       The entirety of the information documented in the History of Present Illness, Review of Systems and Physical Exam were personally obtained by me. Portions of this information were initially documented by the CMA and reviewed by me for thoroughness and  accuracy.     Mila Merry, MD  [facility] [phone] (phone) [phone] (fax)  [facility]

## 2023-01-01 NOTE — Patient Instructions (Addendum)
Please review the attached list of medications and notify my office if there are any errors.   Finish up your current bottle of amlodipine, but I'm going to change the dose to 2.5mg  when you get your refill next week

## 2023-02-18 ENCOUNTER — Other Ambulatory Visit: Payer: Self-pay | Admitting: Family Medicine

## 2023-02-18 DIAGNOSIS — I10 Essential (primary) hypertension: Secondary | ICD-10-CM

## 2023-03-11 DIAGNOSIS — H353131 Nonexudative age-related macular degeneration, bilateral, early dry stage: Secondary | ICD-10-CM | POA: Diagnosis not present

## 2023-03-11 DIAGNOSIS — H401131 Primary open-angle glaucoma, bilateral, mild stage: Secondary | ICD-10-CM | POA: Diagnosis not present

## 2023-03-11 DIAGNOSIS — H04123 Dry eye syndrome of bilateral lacrimal glands: Secondary | ICD-10-CM | POA: Diagnosis not present

## 2023-03-11 DIAGNOSIS — Z961 Presence of intraocular lens: Secondary | ICD-10-CM | POA: Diagnosis not present

## 2023-03-11 DIAGNOSIS — H26492 Other secondary cataract, left eye: Secondary | ICD-10-CM | POA: Diagnosis not present

## 2023-03-20 ENCOUNTER — Ambulatory Visit: Payer: Medicare PPO | Admitting: Podiatry

## 2023-03-24 ENCOUNTER — Encounter: Payer: Self-pay | Admitting: Podiatry

## 2023-03-24 ENCOUNTER — Ambulatory Visit: Payer: Medicare PPO | Admitting: Podiatry

## 2023-03-24 DIAGNOSIS — M79675 Pain in left toe(s): Secondary | ICD-10-CM

## 2023-03-24 DIAGNOSIS — I739 Peripheral vascular disease, unspecified: Secondary | ICD-10-CM

## 2023-03-24 DIAGNOSIS — B351 Tinea unguium: Secondary | ICD-10-CM

## 2023-03-24 DIAGNOSIS — M79674 Pain in right toe(s): Secondary | ICD-10-CM

## 2023-03-24 DIAGNOSIS — L84 Corns and callosities: Secondary | ICD-10-CM | POA: Diagnosis not present

## 2023-03-30 NOTE — Progress Notes (Signed)
  Subjective:  Patient ID: Anna Johnson, female    DOB: 05-01-1941,  MRN: [id number]  Anna Johnson presents to clinic today for at risk foot care. Patient has h/o PAD and corn(s) R 5th toe and painful thick toenails that are difficult to trim. Painful toenails interfere with ambulation. Aggravating factors include wearing enclosed shoe gear. Pain is relieved with periodic professional debridement. Painful corns are aggravated when weightbearing when wearing enclosed shoe gear. Pain is relieved with periodic professional debridement.   New problem(s): None.   PCP is Malva Limes, MD.  Allergies  Allergen Reactions   Codeine Other (See Comments)   Seasonal Ic [Cholestatin]    Covera-Hs [Verapamil] Rash    Review of Systems: Negative except as noted in the HPI.  Objective: No changes noted in today's physical examination. There were no vitals filed for this visit. Anna Johnson is a pleasant 82 y.o. female WD, WN in NAD. AAO x 3.  Vascular Examination: CFT <4 seconds b/l. DP pulses diminished b/l. PT pulses diminished b/l. Digital hair absent. Skin temperature gradient warm to cool b/l. No ischemia or gangrene. No cyanosis or clubbing noted b/l.    Neurological Examination: Sensation grossly intact b/l with 10 gram monofilament. Vibratory sensation intact b/l.   Dermatological Examination: Pedal skin thin, shiny and atrophic b/l. No open wounds. No interdigital macerations.   Toenails 1-5 b/l thick, discolored, elongated with subungual debris and pain on dorsal palpation.   Hyperkeratotic lesion(s) medial DIPJ of R 5th toe.  No erythema, no edema, no drainage, no fluctuance. Fissured hyperkeratotic lesion(s) noted left great toe.  Musculoskeletal Examination: Muscle strength 5/5 to all lower extremity muscle groups bilaterally. Adductovarus deformity R 5th toe.  Radiographs: None  Assessment/Plan: 1. Pain due to onychomycosis of toenails of both feet   2. Corns   3.  PAD (peripheral artery disease) (HCC)     -Consent given for treatment as described below: -Examined patient. -Patient to continue soft, supportive shoe gear daily. -Mycotic toenails 1-5 bilaterally were debrided in length and girth with sterile nail nippers and dremel without incident. -Corn(s) R 5th toe pared utilizing sterile scalpel blade without complication or incident. Total number debrided=1. -Dispensed tube foam. Apply to R 5th toe every morning. Remove every evening. -Patient/POA to call should there be question/concern in the interim.   Return in about 3 months (around 06/24/2023).  Freddie Breech, DPM

## 2023-04-21 DIAGNOSIS — Z853 Personal history of malignant neoplasm of breast: Secondary | ICD-10-CM | POA: Diagnosis not present

## 2023-04-21 DIAGNOSIS — Z1231 Encounter for screening mammogram for malignant neoplasm of breast: Secondary | ICD-10-CM | POA: Diagnosis not present

## 2023-04-21 DIAGNOSIS — R92323 Mammographic fibroglandular density, bilateral breasts: Secondary | ICD-10-CM | POA: Diagnosis not present

## 2023-04-29 ENCOUNTER — Ambulatory Visit (INDEPENDENT_AMBULATORY_CARE_PROVIDER_SITE_OTHER): Payer: Medicare PPO

## 2023-04-29 VITALS — BP 118/72 | Ht 62.0 in | Wt 116.4 lb

## 2023-04-29 DIAGNOSIS — Z23 Encounter for immunization: Secondary | ICD-10-CM

## 2023-04-29 DIAGNOSIS — Z0000 Encounter for general adult medical examination without abnormal findings: Secondary | ICD-10-CM | POA: Diagnosis not present

## 2023-04-29 NOTE — Patient Instructions (Addendum)
Anna Johnson , Thank you for taking time to come for your Medicare Wellness Visit. I appreciate your ongoing commitment to your health goals. Please review the following plan we discussed and let me know if I can assist you in the future.   Referrals/Orders/Follow-Ups/Clinician Recommendations: none  This is a list of the screening recommended for you and due dates:  Health Maintenance  Topic Date Due   Zoster (Shingles) Vaccine (1 of 2) 12/12/1958   DTaP/Tdap/Td vaccine (3 - Td or Tdap) 04/28/2021   COVID-19 Vaccine (6 - 2023-24 season) 04/13/2023   DEXA scan (bone density measurement)  11/07/2023   Medicare Annual Wellness Visit  04/28/2024   Pneumonia Vaccine  Completed   Flu Shot  Completed   HPV Vaccine  Aged Out    Advanced directives: (Copy Requested) Please bring a copy of your health care power of attorney and living will to the office to be added to your chart at your convenience.  Next Medicare Annual Wellness Visit scheduled for next year: Yes 05/03/2024 @ 11:25am in person

## 2023-04-29 NOTE — Progress Notes (Signed)
Subjective:   Anna Johnson is a 82 y.o. female who presents for Medicare Annual (Subsequent) preventive examination.  Visit Complete: In person  Patient Medicare AWV questionnaire was completed by the patient on (not done); I have confirmed that all information answered by patient is correct and no changes since this date. Cardiac Risk Factors include: advanced age (>41men, >34 women);dyslipidemia;hypertension;sedentary lifestyle    Objective:    Today's Vitals   04/29/23 1029  BP: 118/72  Weight: 116 lb 6.4 oz (52.8 kg)  Height: 5\' 2"  (1.575 m)   Body mass index is 21.29 kg/m.     04/29/2023   10:48 AM 12/28/2019   10:43 AM 12/24/2017    9:19 AM 12/04/2016    1:50 PM 11/23/2015    3:15 PM 05/08/2015   10:50 AM  Advanced Directives  Does Patient Have a Medical Advance Directive? Yes Yes Yes No Yes Yes  Type of Estate agent of [place];Living will Healthcare Power of [place];Living will Healthcare Power of [employer] Power of [employer] Power of [place];Living will  Copy of Healthcare Power of Attorney in Chart?  No - copy requested No - copy requested     Would patient like information on creating a medical advance directive?    --      Current Medications (verified) Outpatient Encounter Medications as of 04/29/2023  Medication Sig   Acetaminophen 500 MG capsule Take 2 capsules by mouth as needed for fever.   alendronate (FOSAMAX) 70 MG tablet TAKE 1 TABLET BY MOUTH WEEKLY. TAKE IN THE MORNING WITH 8 OZ OF WATER. DO NOT EAT, DRINK OR LIE DOWN FOR 30 MIN AFTER.   amLODipine (NORVASC) 5 MG tablet TAKE 1 TABLET BY MOUTH DAILY   amoxicillin (AMOXIL) 500 MG capsule Take 2,000 mg by mouth as directed. Prior to dental procedures   aspirin 81 MG tablet Take 81 mg by mouth daily.   benazepril (LOTENSIN) 20 MG tablet TAKE 1 TABLET BY MOUTH DAILY. TAKE IN PLACE OF QUINAPRIL.   cholecalciferol (VITAMIN D) 1000 UNITS tablet Take 2,000 Units by  mouth daily.    Coenzyme Q10 (CO Q-10) 200 MG CAPS Take 200 mg by mouth daily.   dorzolamide-timolol (COSOPT) 22.3-6.8 MG/ML ophthalmic solution    fluticasone (FLONASE) 50 MCG/ACT nasal spray USE TWO SPRAYS IN EACH NOSTRIL EACH DAY AS DIRECTED BY PHYSICIAN   hydrochlorothiazide (HYDRODIURIL) 25 MG tablet Take 1 tablet (25 mg total) by mouth daily.   latanoprost (XALATAN) 0.005 % ophthalmic solution Place 1 drop into both eyes at bedtime.   letrozole (FEMARA) 2.5 MG tablet Take 2.5 mg by mouth daily.   loratadine (CLARITIN) 10 MG tablet Take 10 mg by mouth daily as needed for allergies.   lovastatin (MEVACOR) 40 MG tablet TAKE 1 TABLET BY MOUTH AT BEDTIME   LUMIGAN 0.01 % SOLN Place 1 drop into both eyes at bedtime.    metoprolol succinate (TOPROL-XL) 50 MG 24 hr tablet TAKE 1 TABLET BY MOUTH DAILY WITH ANY MEAL OR IMMEDIATELY FOLLOWING A MEAL   timolol (TIMOPTIC) 0.5 % ophthalmic solution Apply to eye. (Patient not taking: Reported on 04/29/2023)   No facility-administered encounter medications on file as of 04/29/2023.    Allergies (verified) Codeine, Seasonal ic [cholestatin], and Covera-hs [verapamil]   History: Past Medical History:  Diagnosis Date   Allergy    Glaucoma    Hyperlipidemia    Hypertension    Osteoporosis    Pneumonia 03/29/2015   Past Surgical  History:  Procedure Laterality Date   BREAST BIOPSY Left 03/16/2018   Affirm Biopsy- Ribbon clip- positive   BREAST LUMPECTOMY Left 04/2018   CATARACT EXTRACTION  2004   DILATION AND CURETTAGE OF UTERUS     HIP FRACTURE SURGERY  09/15/2010   left hip   WRIST FRACTURE SURGERY     ORIF   Family History  Problem Relation Age of Onset   Uterine cancer Mother    Heart attack Father    Healthy Brother    Cancer Son        colon, died from sx   Breast cancer Neg Hx    Social History   Socioeconomic History   Marital status: Married    Spouse name: Iantha Fallen   Number of children: 2   Years of education: [employer] education level: Bachelor's degree (e.g., BA, AB, BS)  Occupational History   Occupation: Retired Runner, broadcasting/film/video  Tobacco Use   Smoking status: Never   Smokeless tobacco: Never  Vaping Use   Vaping status: Never Used  Substance and Sexual Activity   Alcohol use: Yes    Alcohol/week: 1.0 standard drink of alcohol    Types: 1 Glasses of wine per week    Comment: Occasionally   Drug use: No   Sexual activity: Not on file  Other Topics Concern   Not on file  Social History Narrative   Not on file   Social Determinants of Health   Financial Resource Strain: Low Risk  (04/29/2023)   Overall Financial Resource Strain (CARDIA)    Difficulty of Paying Living Expenses: Not hard at all  Food Insecurity: No Food Insecurity (04/29/2023)   Hunger Vital Sign    Worried About Running Out of Food in the Last Year: Never true    Ran Out of Food in the Last Year: Never true  Transportation Needs: No Transportation Needs (04/29/2023)   PRAPARE - Administrator, Civil Service (Medical): No    Lack of Transportation (Non-Medical): No  Physical Activity: Inactive (04/29/2023)   Exercise Vital Sign    Days of Exercise per Week: 0 days    Minutes of Exercise per Session: 0 min  Stress: No Stress Concern Present (04/29/2023)   [employer] of Occupational Health - Occupational Stress Questionnaire    Feeling of Stress : Only a little  Social Connections: Socially Integrated (04/29/2023)   Social Connection and Isolation Panel [NHANES]    Frequency of Communication with Friends and Family: More than three times a week    Frequency of Social Gatherings with Friends and Family: Three times a week    Attends Religious Services: 1 to 4 times per year    Active Member of Clubs or Organizations: Yes    Attends Banker Meetings: 1 to 4 times per year    Marital Status: Married    Tobacco Counseling Counseling given: Not Answered   Clinical Intake:  Pre-visit preparation  completed: Yes  Pain : No/denies pain     BMI - recorded: 21.29 Nutritional Status: BMI of 19-24  Normal Nutritional Risks: None Diabetes: No  How often do you need to have someone help you when you read instructions, pamphlets, or other written materials from your doctor or pharmacy?: 1 - Never  Interpreter Needed?: No  Comments: lives with husband Information entered by :: B.Karlee Staff,LPN   Activities of Daily Living    04/29/2023   10:49 AM 06/28/2022   10:36 AM  In  your present state of health, do you have any difficulty performing the following activities:  Hearing? 0 0  Vision? 0 0  Difficulty concentrating or making decisions? 0 0  Walking or climbing stairs? 1 0  Dressing or bathing? 0 0  Doing errands, shopping? 0 0  Preparing Food and eating ? N   Using the Toilet? N   In the past six months, have you accidently leaked urine? Y   Do you have problems with loss of bowel control? N   Managing your Medications? N   Managing your Finances? N   Housekeeping or managing your Housekeeping? N     Patient Care Team: Malva Limes, MD as PCP - General (Family Medicine) Sallye Lat, MD as Consulting Physician (Ophthalmology) Alesia Banda, DDS as Consulting Physician (Dentistry) Geanie Logan, MD as Referring Physician (Otolaryngology) Helane Gunther, DPM as Consulting Physician (Podiatry) Arminda Resides, MD as Consulting Physician (Dermatology)  Indicate any recent Medical Services you may have received from other than [facility] providers in the past year (date may be approximate).     Assessment:   This is a routine wellness examination for Anna Johnson.  Hearing/Vision screen Hearing Screening - Comments:: Pt says I can hear well Vision Screening - Comments:: Pt says she sees well with glasses   Goals Addressed             This Visit's Progress    COMPLETED: DIET - INCREASE WATER INTAKE   On track    Recommend increasing water intake to 4-6 glasses a day.       Exercise   Not on track    Recommend increasing exercise/activity. Pt to start walking 2 days a week for 45 minutes.        Depression Screen    04/29/2023   10:46 AM 01/01/2023   10:04 AM 06/28/2022   10:35 AM 12/04/2021   11:06 AM 06/27/2021   10:00 AM 02/28/2021   10:37 AM 12/28/2019   10:38 AM  PHQ 2/9 Scores  PHQ - 2 Score 0 0 0 0 0 0 0  PHQ- 9 Score   0 1 1 0     Fall Risk    04/29/2023   10:39 AM 01/01/2023   10:04 AM 06/28/2022   10:36 AM 12/04/2021   11:06 AM 06/27/2021   10:01 AM  Fall Risk   Falls in the past year? 0 0 0 0 0  Number falls in past yr: 0 0 0 0 0  Injury with Fall? 0 0 0 0 0  Risk for fall due to : No Fall Risks No Fall Risks No Fall Risks No Fall Risks No Fall Risks  Follow up Falls prevention discussed;Education provided Falls evaluation completed Falls evaluation completed Falls evaluation completed Falls evaluation completed    MEDICARE RISK AT HOME: Medicare Risk at Home Any stairs in or around the home?: Yes If so, are there any without handrails?: Yes Home free of loose throw rugs in walkways, pet beds, electrical cords, etc?: Yes Adequate lighting in your home to reduce risk of falls?: Yes Life alert?: No Use of a cane, walker or w/c?: No Grab bars in the bathroom?: Yes Shower chair or bench in shower?: No Elevated toilet seat or a handicapped toilet?: No  TIMED UP AND GO:  Was the test performed?  Yes  Length of time to ambulate 10 feet: 25 sec Gait slow and steady without use of assistive device    Cognitive Function:  04/29/2023   10:49 AM 12/28/2019   10:49 AM 12/24/2017    9:21 AM 12/04/2016    1:55 PM  6CIT Screen  What Year? 0 points 0 points 0 points 0 points  What month? 0 points 0 points 0 points 0 points  What time? 0 points 0 points 0 points 0 points  Count back from 20 0 points 0 points 0 points 0 points  Months in reverse 0 points 0 points 0 points 0 points  Repeat phrase 0 points 0 points 0 points 0  points  Total Score 0 points 0 points 0 points 0 points    Immunizations Immunization History  Administered Date(s) Administered   Fluad Quad(high Dose 65+) 05/26/2019, 06/20/2020, 06/27/2021, 06/28/2022   Fluad Trivalent(High Dose 65+) 04/29/2023   Influenza Split 04/29/2011, 05/04/2012   Influenza, High Dose Seasonal PF 05/08/2015, 05/30/2016, 06/10/2017, 07/01/2018   Influenza,inj,Quad PF,6+ Mos 05/03/2013, 04/27/2014   PFIZER Comirnaty(Gray Top)Covid-19 Tri-Sucrose Vaccine 09/17/2019, 10/08/2019   PFIZER(Purple Top)SARS-COV-2 Vaccination 09/17/2019, 10/08/2019, 07/19/2020   Pneumococcal Conjugate-13 11/03/2013   Pneumococcal Polysaccharide-23 06/04/2008, 07/01/2018   Td 04/13/2008   Tdap 04/29/2011   Zoster, Live 04/13/2008    TDAP status: Up to date  Flu Vaccine status: Completed at today's visit  Pneumococcal vaccine status: Up to date  Covid-19 vaccine status: Completed vaccines  Qualifies for Shingles Vaccine? Yes   Zostavax completed No   Shingrix Completed?: No.    Education has been provided regarding the importance of this vaccine. Patient has been advised to call insurance company to determine out of pocket expense if they have not yet received this vaccine. Advised may also receive vaccine at local pharmacy or Health Dept. Verbalized acceptance and understanding.  Screening Tests Health Maintenance  Topic Date Due   Zoster Vaccines- Shingrix (1 of 2) 12/12/1959   DTaP/Tdap/Td (3 - Td or Tdap) 04/28/2021   COVID-19 Vaccine (6 - 2023-24 season) 04/13/2023   DEXA SCAN  11/07/2023   Medicare Annual Wellness (AWV)  04/28/2024   Pneumonia Vaccine 33+ Years old  Completed   INFLUENZA VACCINE  Completed   HPV VACCINES  Aged Out    Health Maintenance  Health Maintenance Due  Topic Date Due   Zoster Vaccines- Shingrix (1 of 2) 12[birth year]12/1959   DTaP/Tdap/Td (3 - Td or Tdap) 04/28/2021   COVID-19 Vaccine (6 - 2023-24 season) 04/13/2023    Colorectal cancer  screening: No longer required.   Mammogram status: No longer required due to age.  Bone Density status: Completed yes. Results reflect: Bone density results: OSTEOPOROSIS. Repeat every 5 years.  Lung Cancer Screening: (Low Dose CT Chest recommended if Age 52-80 years, 20 pack-year currently smoking OR have quit w/in 15years.) does not qualify.   Lung Cancer Screening Referral: no  Additional Screening:  Hepatitis C Screening: does not qualify; Completed yes  Vision Screening: Recommended annual ophthalmology exams for early detection of glaucoma and other disorders of the eye. Is the patient up to date with their annual eye exam?  Yes  Who is the provider or what is the name of the office in which the patient attends annual eye exams? Dr Dione Booze If pt is not established with a provider, would they like to be referred to a provider to establish care? No .   Dental Screening: Recommended annual dental exams for proper oral hygiene  Diabetic Foot Exam: n/a  Community Resource Referral / Chronic Care Management: CRR required this visit?  No   CCM required this visit?  No  Plan:     I have personally reviewed and noted the following in the patient's chart:   Medical and social history Use of alcohol, tobacco or illicit drugs  Current medications and supplements including opioid prescriptions. Patient is not currently taking opioid prescriptions. Functional ability and status Nutritional status Physical activity Advanced directives List of other physicians Hospitalizations, surgeries, and ER visits in previous 12 months Vitals Screenings to include cognitive, depression, and falls Referrals and appointments  In addition, I have reviewed and discussed with patient certain preventive protocols, quality metrics, and best practice recommendations. A written personalized care plan for preventive services as well as general preventive health recommendations were provided to  patient.     Sue Lush, LPN   11/11/270   After Visit Summary: PRINTED AND GIVEN TO PT  Nurse Notes: Pt presents with short stepped gait with slight limp. Pt says it is due to "feet problems". She indicates she goes to a podiatrist. Pt relays she goes up and down stairs all the time with no problems. She just takes her time and hold to the rails. The patient states she is doing well and has no concerns or questions at this time.

## 2023-04-30 ENCOUNTER — Other Ambulatory Visit: Payer: Self-pay | Admitting: Family Medicine

## 2023-05-01 NOTE — Telephone Encounter (Signed)
Requested medication (s) are due for refill today: routing for review  Requested medication (s) are on the active medication list: yes  Last refill:  06/30/19  Future visit scheduled: yes  Notes to clinic:  Unable to refill per protocol, last refill by historical provider.      Requested Prescriptions  Pending Prescriptions Disp Refills   letrozole (FEMARA) 2.5 MG tablet [Pharmacy Med Name: LETROZOLE 2.5 MG TAB] 30 tablet     Sig: TAKE 1 TABLET BY MOUTH DAILY     Off-Protocol Failed - 04/30/2023  1:00 PM      Failed - Medication not assigned to a protocol, review manually.      Passed - Valid encounter within last 12 months    Recent Outpatient Visits           4 months ago Primary hypertension   [place] [facility] Malva Limes, MD   10 months ago Annual physical exam   [facility] Malva Limes, MD   1 year ago Primary hypertension   [place] [facility] Malva Limes, MD   1 year ago Osteoporosis without current pathological fracture, unspecified osteoporosis type   [facility] Malva Limes, MD   1 year ago Annual physical exam   [facility] Malva Limes, MD       Future Appointments             In 2 months Fisher, Demetrios Isaacs, MD [facility]

## 2023-06-19 ENCOUNTER — Ambulatory Visit: Payer: Medicare PPO | Admitting: Podiatry

## 2023-06-24 ENCOUNTER — Other Ambulatory Visit: Payer: Self-pay | Admitting: Family Medicine

## 2023-06-24 DIAGNOSIS — E785 Hyperlipidemia, unspecified: Secondary | ICD-10-CM

## 2023-06-25 ENCOUNTER — Telehealth: Payer: Self-pay | Admitting: Family Medicine

## 2023-06-25 NOTE — Telephone Encounter (Signed)
Requested medication (s) are due for refill today: yes  Requested medication (s) are on the active medication list: yes  Last refill:  12/17/22 #90/1  Future visit scheduled: yes  Notes to clinic:  Unable to refill per protocol due to failed labs, no updated results.      Requested Prescriptions  Pending Prescriptions Disp Refills   lovastatin (MEVACOR) 40 MG tablet [Pharmacy Med Name: LOVASTATIN 40 MG TAB] 90 tablet 1    Sig: TAKE 1 TABLET BY MOUTH AT BEDTIME     Cardiovascular:  Antilipid - Statins 2 Failed - 06/24/2023  1:17 PM      Failed - Cr in normal range and within 360 days    Creat  Date Value Ref Range Status  06/11/2017 0.76 0.60 - 0.93 mg/dL Final    Comment:    For patients >25 years of age, the reference limit for Creatinine is approximately 13% higher for people identified as African-American. .    Creatinine, Ser  Date Value Ref Range Status  06/28/2022 0.95 0.57 - 1.00 mg/dL Final         Failed - Lipid Panel in normal range within the last 12 months    Cholesterol, Total  Date Value Ref Range Status  06/28/2022 174 100 - 199 mg/dL Final   LDL Cholesterol (Calc)  Date Value Ref Range Status  06/11/2017 85 mg/dL (calc) Final    Comment:    Reference range: <100 . Desirable range <100 mg/dL for primary prevention;   <70 mg/dL for patients with CHD or diabetic patients  with > or = 2 CHD risk factors. Marland Kitchen LDL-C is now calculated using the Martin-Hopkins  calculation, which is a validated novel method providing  better accuracy than the Friedewald equation in the  estimation of LDL-C.  Horald Pollen et al. Lenox Ahr. 2440;102(72): 2061-2068  (http://education.[facility].com/faq/FAQ164)    LDL Chol Calc (NIH)  Date Value Ref Range Status  06/28/2022 72 0 - 99 mg/dL Final   HDL  Date Value Ref Range Status  06/28/2022 93 >39 mg/dL Final   Triglycerides  Date Value Ref Range Status  06/28/2022 44 0 - 149 mg/dL Final         Passed - Patient  is not pregnant      Passed - Valid encounter within last 12 months    Recent Outpatient Visits           5 months ago Primary hypertension   [place] [facility] Malva Limes, MD   12 months ago Annual physical exam   [facility] Malva Limes, MD   1 year ago Primary hypertension   [place] [facility] Malva Limes, MD   1 year ago Osteoporosis without current pathological fracture, unspecified osteoporosis type   [facility] Malva Limes, MD   1 year ago Annual physical exam   [facility] Malva Limes, MD       Future Appointments             In 1 week Fisher, Demetrios Isaacs, MD [facility]

## 2023-06-25 NOTE — Telephone Encounter (Signed)
[facility] is calling because they sent a request for pt's lovastatin (MEVACOR) 40 MG tablet [[id number]] and haven't heard anything regarding it and pt is currently out of medication. Please follow up with [facility].

## 2023-06-30 ENCOUNTER — Ambulatory Visit: Payer: Medicare PPO | Admitting: Podiatry

## 2023-07-02 ENCOUNTER — Ambulatory Visit: Payer: Medicare PPO | Admitting: Family Medicine

## 2023-07-02 VITALS — BP 134/60 | HR 73 | Temp 97.9°F | Ht 62.0 in | Wt 114.0 lb

## 2023-07-02 DIAGNOSIS — I10 Essential (primary) hypertension: Secondary | ICD-10-CM

## 2023-07-02 DIAGNOSIS — E559 Vitamin D deficiency, unspecified: Secondary | ICD-10-CM | POA: Diagnosis not present

## 2023-07-02 DIAGNOSIS — E785 Hyperlipidemia, unspecified: Secondary | ICD-10-CM

## 2023-07-02 DIAGNOSIS — M810 Age-related osteoporosis without current pathological fracture: Secondary | ICD-10-CM

## 2023-07-02 NOTE — Patient Instructions (Signed)
.   Please review the attached list of medications and notify my office if there are any errors.   . Please bring all of your medications to every appointment so we can make sure that our medication list is the same as yours.   

## 2023-07-02 NOTE — Progress Notes (Signed)
Established patient visit   Patient: Anna Johnson   DOB: 02-06-1941   82 y.o. Female  MRN: [id number] Visit Date: 07/02/2023  Today's healthcare provider: Mila Merry, MD   Chief Complaint  Patient presents with   Hypertension    Patient was last seen 6 months ago.  At that time her Amlodipine was reduced to 2.5 mg daily.   BP Readings from Last 3 Encounters: 04/29/23 : 118/72 01/01/23 : (!) 115/56 11/05/22 : (!) 121/42     Subjective    Discussed the use of AI scribe software for clinical note transcription with the patient, who gave verbal consent to proceed.  History of Present Illness   patient presents for follow hypertension, lipids, vitamin D since reducing amlodipine to 2.5mg  at her visit 6 months ago due to fatigue and dizziness. she states that overall she feels well. but notes some fatigue, which she attributes to age and her busy lifestyle caring for her husband, who has recently developed memory problems.  She also denies any shortness of breath or other respiratory symptoms. She reports consuming tomato juice and Coca Cola frequently, but otherwise consumes healthy diet.       Medications: Outpatient Medications Prior to Visit  Medication Sig Note   Acetaminophen 500 MG capsule Take 2 capsules by mouth as needed for fever.    alendronate (FOSAMAX) 70 MG tablet TAKE 1 TABLET BY MOUTH WEEKLY. TAKE IN THE MORNING WITH 8 OZ OF WATER. DO NOT EAT, DRINK OR LIE DOWN FOR 30 MIN AFTER.    amLODipine (NORVASC) 5 MG tablet TAKE 1 TABLET BY MOUTH DAILY 07/02/2023: Patient taking 2.5 mg daily    amoxicillin (AMOXIL) 500 MG capsule Take 2,000 mg by mouth as directed. Prior to dental procedures    aspirin 81 MG tablet Take 81 mg by mouth daily.    benazepril (LOTENSIN) 20 MG tablet TAKE 1 TABLET BY MOUTH DAILY. TAKE IN PLACE OF QUINAPRIL.    cholecalciferol (VITAMIN D) 1000 UNITS tablet Take 2,000 Units by mouth daily.     Coenzyme Q10 (CO Q-10) 200 MG CAPS Take 200  mg by mouth daily.    dorzolamide-timolol (COSOPT) 22.3-6.8 MG/ML ophthalmic solution     fluticasone (FLONASE) 50 MCG/ACT nasal spray USE TWO SPRAYS IN EACH NOSTRIL EACH DAY AS DIRECTED BY PHYSICIAN    hydrochlorothiazide (HYDRODIURIL) 25 MG tablet Take 1 tablet (25 mg total) by mouth daily.    latanoprost (XALATAN) 0.005 % ophthalmic solution Place 1 drop into both eyes at bedtime.    letrozole (FEMARA) 2.5 MG tablet Take 2.5 mg by mouth daily.    loratadine (CLARITIN) 10 MG tablet Take 10 mg by mouth daily as needed for allergies.    lovastatin (MEVACOR) 40 MG tablet TAKE 1 TABLET BY MOUTH AT BEDTIME    LUMIGAN 0.01 % SOLN Place 1 drop into both eyes at bedtime.     metoprolol succinate (TOPROL-XL) 50 MG 24 hr tablet TAKE 1 TABLET BY MOUTH DAILY WITH ANY MEAL OR IMMEDIATELY FOLLOWING A MEAL    timolol (TIMOPTIC) 0.5 % ophthalmic solution Apply to eye.    No facility-administered medications prior to visit.   Review of Systems  Constitutional:  Negative for appetite change, chills, fatigue and fever.  Respiratory:  Negative for chest tightness and shortness of breath.   Cardiovascular:  Negative for chest pain and palpitations.  Gastrointestinal:  Negative for abdominal pain, nausea and vomiting.  Neurological:  Negative for dizziness and weakness.  Objective    BP 134/60 (BP Location: Left Arm, Patient Position: Sitting, Cuff Size: Normal)   Pulse 73   Temp 97.9 F (36.6 C) (Oral)   Ht 5\' 2"  (1.575 m)   Wt 114 lb (51.7 kg)   SpO2 99%   BMI 20.85 kg/m   Physical Exam   General: Appearance:    Well developed, well nourished female in no acute distress  Eyes:    PERRL, conjunctiva/corneas clear, EOM's intact       Lungs:     Clear to auscultation bilaterally, respirations unlabored  Heart:    Normal heart rate. Normal rhythm. No murmurs, rubs, or gallops.    MS:   All extremities are intact.    Neurologic:   Awake, alert, oriented x 3. No apparent focal neurological  defect.        Assessment & Plan     1. Primary hypertension Doing well with reduced dose of amlodipine. Continue current medications.    2. Osteoporosis without current pathological fracture, unspecified osteoporosis type Dong well on alendronate weekly. BMD due next spring.   3. Vitamin D deficiency  - VITAMIN D 25 Hydroxy (Vit-D Deficiency, Fractures)  4. Hyperlipidemia, unspecified hyperlipidemia type She is tolerating lovastatin well with no adverse effects.   - Comprehensive metabolic panel - Lipid panel   Mila Merry, MD  [facility] [phone] (phone) [phone] (fax)  [facility]

## 2023-07-03 LAB — COMPREHENSIVE METABOLIC PANEL
ALT: 13 [IU]/L (ref 0–32)
AST: 20 [IU]/L (ref 0–40)
Albumin: 4 g/dL (ref 3.7–4.7)
Alkaline Phosphatase: 44 [IU]/L (ref 44–121)
BUN/Creatinine Ratio: 19 (ref 12–28)
BUN: 18 mg/dL (ref 8–27)
Bilirubin Total: 0.7 mg/dL (ref 0.0–1.2)
CO2: 24 mmol/L (ref 20–29)
Calcium: 9.6 mg/dL (ref 8.7–10.3)
Chloride: 100 mmol/L (ref 96–106)
Creatinine, Ser: 0.94 mg/dL (ref 0.57–1.00)
Globulin, Total: 2.3 g/dL (ref 1.5–4.5)
Glucose: 95 mg/dL (ref 70–99)
Potassium: 4.1 mmol/L (ref 3.5–5.2)
Sodium: 137 mmol/L (ref 134–144)
Total Protein: 6.3 g/dL (ref 6.0–8.5)
eGFR: 60 mL/min/{1.73_m2} (ref >59)

## 2023-07-03 LAB — LIPID PANEL
Chol/HDL Ratio: 2.1 ratio (ref 0.0–4.4)
Cholesterol, Total: 184 mg/dL (ref 100–199)
HDL: 88 mg/dL (ref >39)
LDL Chol Calc (NIH): 87 mg/dL (ref 0–99)
Triglycerides: 46 mg/dL (ref 0–149)
VLDL Cholesterol Cal: 9 mg/dL (ref 5–40)

## 2023-07-03 LAB — VITAMIN D 25 HYDROXY (VIT D DEFICIENCY, FRACTURES): Vit D, 25-Hydroxy: 47.7 ng/mL (ref 30.0–100.0)

## 2023-07-04 ENCOUNTER — Ambulatory Visit: Payer: Medicare PPO | Admitting: Podiatry

## 2023-07-18 ENCOUNTER — Ambulatory Visit: Payer: Medicare PPO | Admitting: Podiatry

## 2023-07-21 ENCOUNTER — Ambulatory Visit: Payer: Medicare PPO | Admitting: Podiatry

## 2023-07-21 ENCOUNTER — Encounter: Payer: Self-pay | Admitting: Podiatry

## 2023-07-21 VITALS — Ht 62.0 in | Wt 114.0 lb

## 2023-07-21 DIAGNOSIS — M79674 Pain in right toe(s): Secondary | ICD-10-CM | POA: Diagnosis not present

## 2023-07-21 DIAGNOSIS — B351 Tinea unguium: Secondary | ICD-10-CM | POA: Diagnosis not present

## 2023-07-21 DIAGNOSIS — L97511 Non-pressure chronic ulcer of other part of right foot limited to breakdown of skin: Secondary | ICD-10-CM

## 2023-07-21 DIAGNOSIS — I739 Peripheral vascular disease, unspecified: Secondary | ICD-10-CM

## 2023-07-21 DIAGNOSIS — M79675 Pain in left toe(s): Secondary | ICD-10-CM

## 2023-07-21 MED ORDER — POVIDONE-IODINE 10 % EX SOLN: 1.0000 | CUTANEOUS | 0 refills | Status: AC | PRN

## 2023-07-21 NOTE — Patient Instructions (Signed)
   DRESSING CHANGES RIGHT 5TH TOE:   PHARMACY SHOPPING LIST: ANTIBACTERIAL SOAP 2 x 2 inch sterile gauze for cleaning wound BETADINE SOLUTION  CLEANSE TOE WITH SALINE OR ANTIBACTERIAL SOAP.  DAB DRY WITH GAUZE SPONGE.  APPLY A LIGHT AMOUNT OF BETADINE SOLUTION TO RIGHT 5TH TOE.  APPLY FABRIC BANDAID.  USE TUBE FOAM ON TOE DAILY FOR PROTECTION.  IF YOU EXPERIENCE ANY FEVER, CHILLS, NIGHTSWEATS, NAUSEA OR VOMITING, ELEVATED OR LOW BLOOD SUGARS, REPORT TO EMERGENCY ROOM.  IF YOU EXPERIENCE INCREASED REDNESS, PAIN, SWELLING, DISCOLORATION, ODOR, PUS, DRAINAGE OR WARMTH OF YOUR FOOT, REPORT TO EMERGENCY ROOM.

## 2023-07-24 ENCOUNTER — Encounter: Payer: Self-pay | Admitting: Podiatry

## 2023-07-24 NOTE — Progress Notes (Signed)
Subjective:  Patient ID: Anna Johnson, female    DOB: [birth year],  MRN: [id number]  82 y.o. female presents with at risk foot care. Patient has h/o PAD and corn(s) right foot and painful thick toenails that are difficult to trim. Painful toenails interfere with ambulation. Aggravating factors include wearing enclosed shoe gear. Pain is relieved with periodic professional debridement. Painful corns are aggravated when weightbearing when wearing enclosed shoe gear. Pain is relieved with periodic professional debridement.  Today, patient c/o painful right 5th digit. States it blistered with clear drainage from toe once, She has been keeping it clean and applying antibiotic cream daily. Denies any fever, chills, night sweats, nausea or vomiting.        Chief Complaint  Patient presents with   Nail Problem    Pt is here for RFC, not a diabetic, PCP is Dr Sherrie Mustache and LOV was in November.     PCP: Malva Limes, MD.  Review of Systems: Negative except as noted in the HPI.   Allergies  Allergen Reactions   Codeine Other (See Comments)   Seasonal Ic [Cholestatin]    Covera-Hs [Verapamil] Rash    Objective:  There were no vitals filed for this visit. Constitutional Patient is a pleasant 82 y.o. female thin build in NAD. AAO x 3.  Vascular Capillary fill time to digits <3 seconds.  DP/PT pulse(s) are diminished palpable b/l lower extremities. Pedal hair absent b/l. Lower extremity skin temperature gradient warm to cool b/l. No pain with calf compression b/l. No cyanosis or clubbing noted. No ischemia nor gangrene noted b/l.   Neurologic Protective sensation intact 5/5 intact bilaterally with 10g monofilament b/l. Vibratory sensation intact b/l. No clonus b/l.   Dermatologic Pedal skin is thin, shiny and atrophic b/l.  No open wounds b/l lower extremities. No interdigital macerations b/l lower extremities. Toenails 1-5 b/l elongated, discolored, dystrophic, thickened, crumbly with  subungual debris and tenderness to dorsal palpation.  Right 5th toe with dried blister roof noted interdigitally. Small pinpoint ulceration noted to level of subcutaneous tissue. Mild odor. No ascending cellulitis. No penetration into deep tissue..  Orthopedic: Normal muscle strength 5/5 to all lower extremity muscle groups bilaterally. Hammertoe deformity noted 2-5 b/l.       Last HgA1c:      No data to display           Assessment:   1. Pain due to onychomycosis of toenails of both feet   2. Skin ulcer of small toe of right foot, limited to breakdown of skin (HCC)   3. PAD (peripheral artery disease) (HCC)    Plan:  -Patient was evaluated and treated and all questions answered.  -Patient/POA/Family member educated on diagnosis and treatment plan of routine ulcer debridement/wound care.  -Ulceration debridement achieved utilizing sharp excisional debridement with sterile currette.. Type/amount of devitalized tissue removed: nonviable hyperkeratosis. -Ulceration cleansed with Puracyn wound cleanser. Betadine Solution applied to base of ulceration and secured with light dressing. Tube foam applied to digit for cushioning and to prevent rubbing of 4th digit. She will follow up with Dr. Logan Bores in two weeks for ulceration. -Wound responded well to today's debridement. -Jalene Mullet given written instructions on daily wound care for R 5th toe ulceration. -Rx sent to pharmacy for Betadine Solution -Frequency of debridements needed to achieve healing: weekly to biweekly -Mycotic toenails 1-5 bilaterally were debrided in length and girth with sterile nail nippers and dremel without incident. -Patient/POA to call should there be question/concern in  the interim.  Return in about 3 months (around 10/19/2023).  Freddie Breech, DPM      [place] LOCATION: [address], [place] [address]                   Office [phone]    [facility] LOCATION: [address] [place], [place] [address] Office [phone]

## 2023-07-28 ENCOUNTER — Other Ambulatory Visit: Payer: Self-pay | Admitting: Family Medicine

## 2023-07-28 DIAGNOSIS — I10 Essential (primary) hypertension: Secondary | ICD-10-CM

## 2023-07-29 NOTE — Telephone Encounter (Signed)
Requested Prescriptions  Pending Prescriptions Disp Refills   metoprolol succinate (TOPROL-XL) 50 MG 24 hr tablet [Pharmacy Med Name: METOPROLOL SUCCINATE ER 50 MG TAB] 90 tablet 1    Sig: TAKE 1 TABLET BY MOUTH DAILY WITH ANY MEAL OR IMMEDIATELY FOLLOWING A MEAL     Cardiovascular:  Beta Blockers Passed - 07/29/2023  1:42 PM      Passed - Last BP in normal range    BP Readings from Last 1 Encounters:  07/02/23 134/60         Passed - Last Heart Rate in normal range    Pulse Readings from Last 1 Encounters:  07/02/23 73         Passed - Valid encounter within last 6 months    Recent Outpatient Visits           3 weeks ago Primary hypertension   [place] [facility] Malva Limes, MD   6 months ago Primary hypertension   [place] [facility] Malva Limes, MD   1 year ago Annual physical exam   [facility] Malva Limes, MD   1 year ago Primary hypertension   [place] [facility] Malva Limes, MD   1 year ago Osteoporosis without current pathological fracture, unspecified osteoporosis type   [place] [facility] Malva Limes, MD       Future Appointments             In 5 months Fisher, Demetrios Isaacs, MD [place] [facility]             amLODipine (NORVASC) 5 MG tablet [Pharmacy Med Name: AMLODIPINE BESYLATE 5 MG TAB] 90 tablet 1    Sig: TAKE 1 TABLET BY MOUTH DAILY     Cardiovascular: Calcium Channel Blockers 2 Passed - 07/29/2023  1:42 PM      Passed - Last BP in normal range    BP Readings from Last 1 Encounters:  07/02/23 134/60         Passed - Last Heart Rate in normal range    Pulse Readings from Last 1 Encounters:  07/02/23 73         Passed - Valid encounter within last 6 months    Recent Outpatient Visits           3 weeks ago Primary hypertension   [place] [facility] Malva Limes, MD   6  months ago Primary hypertension   [facility] Malva Limes, MD   1 year ago Annual physical exam   [facility] Malva Limes, MD   1 year ago Primary hypertension   [facility] Malva Limes, MD   1 year ago Osteoporosis without current pathological fracture, unspecified osteoporosis type   [facility] Malva Limes, MD       Future Appointments             In 5 months Fisher, Demetrios Isaacs, MD [facility]

## 2023-08-01 ENCOUNTER — Encounter: Payer: Self-pay | Admitting: Podiatry

## 2023-08-01 ENCOUNTER — Ambulatory Visit: Payer: Medicare PPO | Admitting: Podiatry

## 2023-08-01 DIAGNOSIS — M205X1 Other deformities of toe(s) (acquired), right foot: Secondary | ICD-10-CM | POA: Diagnosis not present

## 2023-08-08 ENCOUNTER — Other Ambulatory Visit: Payer: Self-pay | Admitting: Family Medicine

## 2023-08-08 DIAGNOSIS — I10 Essential (primary) hypertension: Secondary | ICD-10-CM

## 2023-08-15 DIAGNOSIS — C50212 Malignant neoplasm of 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: Secondary | ICD-10-CM | POA: Diagnosis not present

## 2023-08-15 DIAGNOSIS — Z170 Estrogen receptor positive status [ER+]: Secondary | ICD-10-CM | POA: Diagnosis not present

## 2023-08-15 DIAGNOSIS — Z853 Personal history of malignant neoplasm of breast: Secondary | ICD-10-CM | POA: Diagnosis not present

## 2023-09-02 ENCOUNTER — Encounter: Payer: Self-pay | Admitting: Podiatry

## 2023-09-02 ENCOUNTER — Ambulatory Visit: Payer: Medicare PPO | Admitting: Podiatry

## 2023-09-02 DIAGNOSIS — L97511 Non-pressure chronic ulcer of other part of right foot limited to breakdown of skin: Secondary | ICD-10-CM | POA: Diagnosis not present

## 2023-09-02 DIAGNOSIS — M205X1 Other deformities of toe(s) (acquired), right foot: Secondary | ICD-10-CM | POA: Diagnosis not present

## 2023-09-02 NOTE — Progress Notes (Signed)
   Chief Complaint  Patient presents with   Wound Check    "I'm here for a follow-up.  The new shoes have helped but I still have trouble with the toe.  The shoes make a big difference."    HPI: 83 y.o. female presenting today for follow-up evaluation of pain and tenderness associated to the right fifth toe.  Patient states that the right fifth toe presses against the adjacent digit and is very tender and painful.  About 2 weeks ago she did purchase a wide fitting pair of [employer]rell shoes and she says that she feels significantly better just over the last 10 days.  Past Medical History:  Diagnosis Date   Allergy    Glaucoma    Hyperlipidemia    Hypertension    Osteoporosis    Pneumonia 03/29/2015    Past Surgical History:  Procedure Laterality Date   BREAST BIOPSY Left 03/16/2018   Affirm Biopsy- Ribbon clip- positive   BREAST LUMPECTOMY Left 04/2018   CATARACT EXTRACTION  2004   DILATION AND CURETTAGE OF UTERUS     HIP FRACTURE SURGERY  09/15/2010   left hip   WRIST FRACTURE SURGERY     ORIF    Allergies  Allergen Reactions   Codeine Other (See Comments)   Seasonal Ic [Cholestatin]    Covera-Hs [Verapamil] Rash     RT fifth toe 08/01/2023  RT fifth toe 08/01/2023  Physical Exam: General: The patient is alert and oriented x3 in no acute distress.  Dermatology: Overall improvement but there continues to be superficial wound noted to the medial aspect of the right fifth toe adjacent to the fourth digit.    Vascular: Palpable pedal pulses bilaterally. Capillary refill within normal limits.  No appreciable edema.  No erythema.  Neurological: Grossly intact via light touch  Musculoskeletal Exam: Adductovarus hammertoe deformity noted to the fifth digit of the foot  Assessment/Plan of Care: 1.  Superficial wound medial aspect of the right fifth toe adjacent fourth digit; improved 2.  Adductovarus hammertoe fifth digit right foot  -Patient evaluated Light debridement of  the superficial ulcer was performed today using a tissue nipper.  Excisional debridement of the superficial skin extending into subcutaneous tissue was performed today with postoperative measurement same as pre-.  It measures approximately 0.3 x 0.3 x 0.1 cm.  It does not extend to bone.  Overall it appears significantly improved since last visit -Continue wide fitting merril shoes. She has had significant improvement with only wearing them for 10 days. Return to clinic 4 weeks, if no improvement may consider surgical arthroplasty of the toe to permanently correct for the chronic wound and pain that the patient is experiencing       Felecia Shelling, DPM [facility]  Dr. Felecia Shelling, DPM    [address] [place], [place] [address]                Office [phone]  Fax [phone]

## 2023-09-10 DIAGNOSIS — T8579XS Infection and inflammatory reaction due to other internal prosthetic devices, implants and grafts, sequela: Secondary | ICD-10-CM | POA: Diagnosis not present

## 2023-09-10 DIAGNOSIS — M2762 Post-osseointegration biological failure of dental implant: Secondary | ICD-10-CM | POA: Diagnosis not present

## 2023-09-10 DIAGNOSIS — M272 Inflammatory conditions of jaws: Secondary | ICD-10-CM | POA: Diagnosis not present

## 2023-09-18 ENCOUNTER — Ambulatory Visit: Payer: Self-pay

## 2023-09-18 NOTE — Telephone Encounter (Signed)
     Chief Complaint: Clemens, tripped in bedroom and landed on her back. Low back is painful now. Symptoms: Above, did not hit her head. Frequency: Monday Pertinent Negatives: Patient denies  Disposition: [] ED /[] Urgent Care (no appt availability in office) / [x] Appointment(In office/virtual)/ []  [place] [facility]/ [] Home Care/ [] Refused Recommended Disposition /[] [place] [facility]/ []  Follow-up with PCP Additional Notes: Agrees with appointment.  Reason for Disposition  MILD weakness (i.e., does not interfere with ability to work, go to school, normal activities)  (Exception: Mild weakness is a chronic symptom.)  Answer Assessment - Initial Assessment Questions 1. MECHANISM: How did the fall happen?     Tripped in bedroom 2. DOMESTIC VIOLENCE AND ELDER ABUSE SCREENING: Did you fall because someone pushed you or tried to hurt you? If Yes, ask: Are you safe now?     No 3. ONSET: When did the fall happen? (e.g., minutes, hours, or days ago)     Monday 4. LOCATION: What part of the body hit the ground? (e.g., back, buttocks, head, hips, knees, hands, head, stomach)     Fell on back 5. INJURY: Did you hurt (injure) yourself when you fell? If Yes, ask: What did you injure? Tell me more about this? (e.g., body area; type of injury; pain severity)     Back hurts today 6. PAIN: Is there any pain? If Yes, ask: How bad is the pain? (e.g., Scale 1-10; or mild,  moderate, severe)   - NONE (0): No pain   - MILD (1-3): Doesn't interfere with normal activities    - MODERATE (4-7): Interferes with normal activities or awakens from sleep    - SEVERE (8-10): Excruciating pain, unable to do any normal activities      Now - Mild 7. SIZE: For cuts, bruises, or swelling, ask: How large is it? (e.g., inches or centimeters)      No 8. PREGNANCY: Is there any chance you are pregnant? When was your last menstrual period?     No 9. OTHER SYMPTOMS: Do you have any other  symptoms? (e.g., dizziness, fever, weakness; new onset or worsening).      No 10. CAUSE: What do you think caused the fall (or falling)? (e.g., tripped, dizzy spell)       Tripped  Protocols used: Falls and [facility]

## 2023-09-19 ENCOUNTER — Ambulatory Visit: Payer: Medicare PPO | Admitting: Family Medicine

## 2023-09-19 ENCOUNTER — Ambulatory Visit
Admission: RE | Admit: 2023-09-19 | Discharge: 2023-09-19 | Disposition: A | Discharge status: Home / Self Care | Payer: Medicare PPO | Source: Ambulatory Visit | Attending: Family Medicine | Admitting: Family Medicine

## 2023-09-19 ENCOUNTER — Encounter: Payer: Self-pay | Admitting: Family Medicine

## 2023-09-19 VITALS — BP 118/59 | HR 77 | Resp 16 | Wt 110.0 lb

## 2023-09-19 DIAGNOSIS — R0781 Pleurodynia: Secondary | ICD-10-CM

## 2023-09-19 NOTE — Progress Notes (Signed)
 Established patient visit   Patient: Anna Johnson   DOB: February 15, 1941   83 y.o. Female  MRN: [id number] Visit Date: 09/19/2023  Today's healthcare provider: Nancyann Perry, MD   Chief Complaint  Patient presents with   Fall    Patient fell Monday this week.  Clemens, tripped in bedroom and landed on her back. Low back is painful now.   Subjective    Discussed the use of AI scribe software for clinical note transcription with the patient, who gave verbal consent to proceed.  History of Present Illness   Anna Johnson is an 83 year old female who presents with pain following a fall.  She experienced a fall in her bedroom early Monday morning after returning from the bathroom. She typically uses a railing to get onto her bed but missed it and fell to the floor. Initially, she did not feel pain but was aware she had injured herself. She contacted her daughter-in-law for assistance. On Tuesday, she was able to drive but did not exit her car. By Wednesday, she continued to stay in the car while her granddaughter visited.  By Thursday, she experienced significant pain throughout the day, primarily on her right side, extending from her shoulder down to the top of her underpants. The pain is described as intermittent, with more intensity in the morning. There is a bruise behind her ear. No difficulty moving her arm or shoulder. She has a little pain along her spine but not much. The pain occasionally feels like a bruise on her back and head.  She has been taking a non-aspirin pain medication, two tablets every four hours, to manage the pain. She has not applied ice or heat to the affected area.       Medications: Outpatient Medications Prior to Visit  Medication Sig   Acetaminophen 500 MG capsule Take 2 capsules by mouth as needed for fever.   alendronate  (FOSAMAX ) 70 MG tablet TAKE 1 TABLET BY MOUTH WEEKLY. TAKE IN THE MORNING WITH 8 OZ OF WATER. DO NOT EAT, DRINK OR LIE DOWN FOR 30 MIN  AFTER.   amLODipine  (NORVASC ) 5 MG tablet TAKE 1 TABLET BY MOUTH DAILY   aspirin 81 MG tablet Take 81 mg by mouth daily.   benazepril  (LOTENSIN ) 20 MG tablet TAKE 1 TABLET BY MOUTH DAILY. TAKE IN PLACE OF QUINAPRIL .   cholecalciferol (VITAMIN D ) 1000 UNITS tablet Take 2,000 Units by mouth daily.    Coenzyme Q10 (CO Q-10) 200 MG CAPS Take 200 mg by mouth daily.   dorzolamide-timolol (COSOPT) 22.3-6.8 MG/ML ophthalmic solution    fluticasone  (FLONASE ) 50 MCG/ACT nasal spray USE TWO SPRAYS IN EACH NOSTRIL EACH DAY AS DIRECTED BY PHYSICIAN   hydrochlorothiazide  (HYDRODIURIL ) 25 MG tablet TAKE 1 TABLET BY MOUTH DAILY   latanoprost (XALATAN) 0.005 % ophthalmic solution Place 1 drop into both eyes at bedtime.   letrozole (FEMARA) 2.5 MG tablet Take 2.5 mg by mouth daily.   loratadine (CLARITIN) 10 MG tablet Take 10 mg by mouth daily as needed for allergies.   lovastatin  (MEVACOR ) 40 MG tablet TAKE 1 TABLET BY MOUTH AT BEDTIME   LUMIGAN 0.01 % SOLN Place 1 drop into both eyes at bedtime.    metoprolol  succinate (TOPROL -XL) 50 MG 24 hr tablet TAKE 1 TABLET BY MOUTH DAILY WITH ANY MEAL OR IMMEDIATELY FOLLOWING A MEAL   timolol (TIMOPTIC) 0.5 % ophthalmic solution Apply to eye.   amoxicillin (AMOXIL) 500 MG capsule Take 2,000 mg  by mouth as directed. Prior to dental procedures   povidone-iodine  (BETADINE ) 10 % external solution Apply 1 Application topically as needed for wound care.   No facility-administered medications prior to visit.   Review of Systems  Constitutional:  Negative for appetite change, chills, fatigue and fever.  Respiratory:  Negative for chest tightness and shortness of breath.   Cardiovascular:  Negative for chest pain and palpitations.  Gastrointestinal:  Negative for abdominal pain, nausea and vomiting.  Neurological:  Negative for dizziness and weakness.       Objective    BP (!) 118/59 (BP Location: Left Arm, Patient Position: Sitting, Cuff Size: Small)   Pulse 77    Resp 16   Wt 110 lb (49.9 kg)   SpO2 97%   BMI 20.12 kg/m   Physical Exam   MUSCULOSKELETAL: Right arm range of motion normal, able to raise above head and bend down without difficulty.tender along right posterior and anterior ribs. No swelling or other gross deformities SKIN: Bruise on back extending to shoulder. Contusion behind right ear.      Assessment & Plan       Fall with subsequent pain and bruising Patient experienced a fall at home, resulting in pain and bruising on the right side of the body, extending from the lower torso to the shoulder, and a bruise behind the ear. No difficulty in arm or shoulder movement. Pain is intermittent and varies in intensity. -Order rib x-rays at [facility] to rule out possible fractures. -Advise use of a heating pad for pain relief and to aid in the breakdown of bruising. -Continue current pain management regimen. -Follow-up with results on Monday, September 22, 2023.         Nancyann Perry, MD  [facility] [phone] (phone) [phone] (fax)  [facility]

## 2023-09-22 ENCOUNTER — Telehealth: Payer: Self-pay

## 2023-09-22 NOTE — Telephone Encounter (Signed)
 Pt given XR results per notes of Dr. Shann Darnel on 09/22/23. Pt verbalized understanding  Lamon Pillow, MD 09/22/2023  9:55 AM EST     Xrays are normal, no fractures. Bruising should heal up over the next 2-3 weeks

## 2023-10-03 ENCOUNTER — Ambulatory Visit: Payer: Medicare PPO | Admitting: Podiatry

## 2023-10-06 DIAGNOSIS — H903 Sensorineural hearing loss, bilateral: Secondary | ICD-10-CM | POA: Diagnosis not present

## 2023-10-06 DIAGNOSIS — H6123 Impacted cerumen, bilateral: Secondary | ICD-10-CM | POA: Diagnosis not present

## 2023-10-20 ENCOUNTER — Ambulatory Visit: Payer: Medicare PPO | Admitting: Podiatry

## 2023-11-14 ENCOUNTER — Ambulatory Visit: Admitting: Podiatry

## 2023-11-14 ENCOUNTER — Encounter: Payer: Self-pay | Admitting: Podiatry

## 2023-11-14 DIAGNOSIS — M205X1 Other deformities of toe(s) (acquired), right foot: Secondary | ICD-10-CM

## 2023-12-02 NOTE — Progress Notes (Signed)
   Chief Complaint  Patient presents with   Nail Problem    Right foot small toe    HPI: 83 y.o. female presenting today for follow-up evaluation of pain and tenderness associated to the right fifth toe.  Patient has noted significant improvement.  She has been wearing the wide shoes and she no longer feels the significant pain that she was experiencing prior  Past Medical History:  Diagnosis Date   Allergy    Glaucoma    Hyperlipidemia    Hypertension    Osteoporosis    Pneumonia 03/29/2015    Past Surgical History:  Procedure Laterality Date   BREAST BIOPSY Left 03/16/2018   Affirm Biopsy- Ribbon clip- positive   BREAST LUMPECTOMY Left 04/2018   CATARACT EXTRACTION  2004   DILATION AND CURETTAGE OF UTERUS     HIP FRACTURE SURGERY  09/15/2010   left hip   WRIST FRACTURE SURGERY     ORIF    Allergies  Allergen Reactions   Codeine Other (See Comments)   Seasonal Ic [Cholestatin]    Covera-Hs [Verapamil] Rash     RT fifth toe 08/01/2023  RT fifth toe 08/01/2023  Physical Exam: General: The patient is alert and oriented x3 in no acute distress.  Dermatology: Significant improvement noted.  The superficial wound to the medial aspect of the toe is healed and resolved  Vascular: Palpable pedal pulses bilaterally. Capillary refill within normal limits.  No appreciable edema.  No erythema.  Neurological: Grossly intact via light touch  Musculoskeletal Exam: Adductovarus hammertoe deformity noted to the fifth digit of the foot  Assessment/Plan of Care: 1.  Superficial wound medial aspect of the right fifth toe adjacent fourth digit; improved 2.  Adductovarus hammertoe fifth digit right foot  -Patient evaluated -Overall the patient feels significantly better -Continue wearing wide shoes that do not constrict the toebox area -Return to clinic 3 months routine footcare       Dot Gazella, DPM [facility]  Dr. Dot Gazella, DPM    [address] [place], [place] [address]                Office [phone]  Fax [phone]

## 2023-12-12 ENCOUNTER — Encounter: Payer: Self-pay | Admitting: Family Medicine

## 2023-12-12 ENCOUNTER — Ambulatory Visit: Admitting: Family Medicine

## 2023-12-12 VITALS — BP 120/68 | HR 70 | Ht 62.0 in | Wt 109.6 lb

## 2023-12-12 DIAGNOSIS — I10 Essential (primary) hypertension: Secondary | ICD-10-CM

## 2023-12-12 DIAGNOSIS — R269 Unspecified abnormalities of gait and mobility: Secondary | ICD-10-CM

## 2023-12-12 DIAGNOSIS — M810 Age-related osteoporosis without current pathological fracture: Secondary | ICD-10-CM

## 2023-12-12 DIAGNOSIS — Z8781 Personal history of (healed) traumatic fracture: Secondary | ICD-10-CM | POA: Diagnosis not present

## 2023-12-12 NOTE — Progress Notes (Unsigned)
 Established patient visit   Patient: Anna Johnson   DOB: 09-15-1940   83 y.o. Female  MRN: [id number] Visit Date: 12/12/2023  Today's healthcare provider: Jeralene Mom, MD   Chief Complaint  Patient presents with   Leg Injury    Feb 5th Pt fell backwards reaching for bed post, left leg starting hurting 2 days after, xray showed nothing broke Abnormal gait, swollen ankles, Pain comes and goes, would like to see about pain management/ PT   Medication Problem    Pt stopped taking fosamax - said it gave her nausea    Subjective    HPI  Discussed the use of AI scribe software for clinical note transcription with the patient, who gave verbal consent to proceed.  History of Present Illness   Anna Johnson is an 83 year old female who presents with difficulty walking following a fall in February.   She has been experiencing difficulty walking since a fall approximately two months ago. Initially, she had pain in her left leg, which has since resolved, but she continues to experience awkwardness and difficulty maneuvering, particularly with her left leg. She feels more cautious and walks slower, using a cane for support, although she attempts to walk short distances without it. No further falls have occurred.  Her rib pain, present initially after the fall, has resolved. She denies any current pain in her legs when weight is applied but feels a lack of confidence in her balance, affecting her mobility. She has not had any physical therapy since the fall, although she had undergone therapy in the past for previous injuries, including a broken hip and arm.  Regarding her medication, she has not taken Fosamax  for about three months. She used to take it once a week with eight ounces of water. She has not experienced any problems with the medication.   She is also due for follow up hypertension. She reports she otherwise well and is tolerating current medication regiment without apparent  side effects.   Lab Results  Component Value Date   NA 137 07/02/2023   K 4.1 07/02/2023   CREATININE 0.94 07/02/2023   EGFR 60 07/02/2023   GLUCOSE 95 07/02/2023   Lab Results  Component Value Date   CHOL 184 07/02/2023   HDL 88 07/02/2023   LDLCALC 87 07/02/2023   TRIG 46 07/02/2023   CHOLHDL 2.1 07/02/2023        Medications: Outpatient Medications Prior to Visit  Medication Sig   Acetaminophen 500 MG capsule Take 2 capsules by mouth as needed for fever.   amLODipine  (NORVASC ) 5 MG tablet TAKE 1 TABLET BY MOUTH DAILY   amoxicillin (AMOXIL) 500 MG capsule Take 2,000 mg by mouth as directed. Prior to dental procedures   aspirin 81 MG tablet Take 81 mg by mouth daily.   benazepril  (LOTENSIN ) 20 MG tablet TAKE 1 TABLET BY MOUTH DAILY. TAKE IN PLACE OF QUINAPRIL .   cholecalciferol (VITAMIN D ) 1000 UNITS tablet Take 2,000 Units by mouth daily.    Coenzyme Q10 (CO Q-10) 200 MG CAPS Take 200 mg by mouth daily.   dorzolamide-timolol (COSOPT) 22.3-6.8 MG/ML ophthalmic solution    fluticasone  (FLONASE ) 50 MCG/ACT nasal spray USE TWO SPRAYS IN EACH NOSTRIL EACH DAY AS DIRECTED BY PHYSICIAN   hydrochlorothiazide  (HYDRODIURIL ) 25 MG tablet TAKE 1 TABLET BY MOUTH DAILY   latanoprost (XALATAN) 0.005 % ophthalmic solution Place 1 drop into both eyes at bedtime.   letrozole (FEMARA) 2.5 MG tablet  Take 2.5 mg by mouth daily.   loratadine (CLARITIN) 10 MG tablet Take 10 mg by mouth daily as needed for allergies.   lovastatin  (MEVACOR ) 40 MG tablet TAKE 1 TABLET BY MOUTH AT BEDTIME   LUMIGAN 0.01 % SOLN Place 1 drop into both eyes at bedtime.    metoprolol  succinate (TOPROL -XL) 50 MG 24 hr tablet TAKE 1 TABLET BY MOUTH DAILY WITH ANY MEAL OR IMMEDIATELY FOLLOWING A MEAL   povidone-iodine  (BETADINE ) 10 % external solution Apply 1 Application topically as needed for wound care.   timolol (TIMOPTIC) 0.5 % ophthalmic solution Apply to eye.   alendronate  (FOSAMAX ) 70 MG tablet TAKE 1 TABLET BY  MOUTH WEEKLY. TAKE IN THE MORNING WITH 8 OZ OF WATER. DO NOT EAT, DRINK OR LIE DOWN FOR 30 MIN AFTER. (Patient not taking: Reported on 12/12/2023)   No facility-administered medications prior to visit.      Objective    BP 120/68 (BP Location: Left Arm, Patient Position: Sitting, Cuff Size: Small)   Pulse 70   Ht 5\' 2"  (1.575 m)   Wt 109 lb 9.6 oz (49.7 kg)   SpO2 99%   BMI 20.05 kg/m  {Insert last BP/Wt (optional):23777}{See vitals history (optional):1}  Physical Exam   General appearance: Well developed, well nourished female, cooperative and in no acute distress Head: Normocephalic, without obvious abnormality, atraumatic Respiratory: Respirations even and unlabored, normal respiratory rate Extremities: All extremities are intact.  Neuro: Very slow, unsteady shuffled gait requires use of cane. No tremors.    Assessment & Plan     1. Gait disturbance (Primary) Worsening after fall in February will some residual intermittent leg pain, mostly on the left.   - Ambulatory referral to Physical Therapy  2. Primary hypertension Well controlled.  Continue current medications.  Follow up 6 months.   3. Osteoporosis without current pathological fracture, unspecified osteoporosis type Reports no apparent side effects from alendronate  which she hasn't taken in a few weeks. Encouraged to start back on medication. Anticipate ordering follow up BMD at next visit.   4. History of hip fracture Continue treatment underlying osteoporosis.    Return in about 6 months (around 06/13/2024).         Jeralene Mom, MD  [facility] [phone] (phone) [phone] (fax)  [facility]

## 2023-12-12 NOTE — Patient Instructions (Signed)
 Anna Johnson  Please review the attached list of medications and notify my office if there are any errors.   . Please bring all of your medications to every appointment so we can make sure that our medication list is the same as yours.

## 2023-12-17 ENCOUNTER — Other Ambulatory Visit: Payer: Self-pay | Admitting: Family Medicine

## 2023-12-17 DIAGNOSIS — I10 Essential (primary) hypertension: Secondary | ICD-10-CM

## 2023-12-18 ENCOUNTER — Ambulatory Visit: Attending: Family Medicine

## 2023-12-18 DIAGNOSIS — Z9181 History of falling: Secondary | ICD-10-CM | POA: Insufficient documentation

## 2023-12-18 DIAGNOSIS — R262 Difficulty in walking, not elsewhere classified: Secondary | ICD-10-CM | POA: Insufficient documentation

## 2023-12-18 DIAGNOSIS — R269 Unspecified abnormalities of gait and mobility: Secondary | ICD-10-CM | POA: Insufficient documentation

## 2023-12-18 DIAGNOSIS — R2681 Unsteadiness on feet: Secondary | ICD-10-CM | POA: Insufficient documentation

## 2023-12-18 DIAGNOSIS — M6281 Muscle weakness (generalized): Secondary | ICD-10-CM | POA: Insufficient documentation

## 2023-12-18 NOTE — Therapy (Signed)
 OUTPATIENT PHYSICAL THERAPY EVALUATION   Patient Name: Anna Johnson MRN: [id number] DOB:September 02, 1940, 83 y.o., female Today's Date: 12/18/2023  END OF SESSION:  PT End of Session - 12/18/23 1037     Visit Number 1    Number of Visits 17    Date for PT Re-Evaluation 02/13/24    PT Start Time 1038    PT Stop Time 1145    PT Time Calculation (min) 67 min    Equipment Utilized During Treatment Gait belt    Activity Tolerance Patient tolerated treatment well    Behavior During Therapy WFL for tasks assessed/performed             Past Medical History:  Diagnosis Date   Allergy    Glaucoma    Hyperlipidemia    Hypertension    Osteoporosis    Pneumonia 03/29/2015   Past Surgical History:  Procedure Laterality Date   BREAST BIOPSY Left 03/16/2018   Affirm Biopsy- Ribbon clip- positive   BREAST LUMPECTOMY Left 04/2018   CATARACT EXTRACTION  2004   DILATION AND CURETTAGE OF UTERUS     HIP FRACTURE SURGERY  09/15/2010   left hip   WRIST FRACTURE SURGERY     ORIF   Patient Active Problem List   Diagnosis Date Noted   Chronic midline low back pain without sciatica 02/28/2021   Osteoarthritis of both shoulders 02/28/2021   Glaucoma of both eyes 04/06/2018   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 (HCC) 04/03/2018   Abnormal chest x-ray 05/08/2015   Bloodgood disease 05/08/2015   Thoracic, thoracolumbar and lumbosacral intervertebral disc disorder 05/08/2015   Osteoporosis 04/07/2015   Vitamin D  deficiency 03/29/2015   Open-angle glaucoma 03/29/2015   Hypertension 03/29/2015   Seasonal allergies 03/29/2015   Osteoarthritis 03/29/2015   Fibrocystic breast disease 03/29/2015   Compression fracture of L1 lumbar vertebra (HCC) 03/29/2015   History of hip fracture 03/29/2015   Hyperlipemia 02/28/2015    PCP: Lamon Pillow, MD   REFERRING PROVIDER: Lamon Pillow, MD   REFERRING DIAG: R26.9 (ICD-10-CM) - Gait disturbance  Rationale for  Evaluation and Treatment: Rehabilitation  THERAPY DIAG:  Difficulty in walking, not elsewhere classified - Plan: PT plan of care cert/re-cert  Unsteadiness on feet - Plan: PT plan of care cert/re-cert  Muscle weakness (generalized) - Plan: PT plan of care cert/re-cert  History of falling - Plan: PT plan of care cert/re-cert  ONSET DATE: September 17, 2023 (pt fell backwards reaching for bed post)  SUBJECTIVE:  SUBJECTIVE STATEMENT: Please see pertinent history.   PERTINENT HISTORY:  Gait disturbance. Pt got up in the middle of the night to go to the bathroom. Pt reached towards her bed but missed and feel onto her back, pt states she reached too early. Felt fine afterwards except 2 days with B LE discomfort. LE is now fine but L LE just feels awkward. Has a hx of L hip fx S/P surgery 15 years ago. Pt states not having pain or anything but feels like she needs exercise.    Blood pressure is controlled per pt. No latex allergies     PAIN:  None   PRECAUTIONS: Fall  RED FLAGS: No known red flags   WEIGHT BEARING RESTRICTIONS: No  FALLS:  Has patient fallen in last 6 months? Yes. Number of falls 1 first week in September 16, 2023.   LIVING ENVIRONMENT: Lives with: lives with their spouse Lives in: House/apartment Stairs: Yes: Internal: 15 steps; on right going up and External: 5 steps; bilateral but cannot reach both Has following equipment at home: Single point cane, Walker - 4 wheeled, and shower chair  OCCUPATION: retired  PLOF: Independent with household mobility with device  PATIENT GOALS: improve balance, improve ability to ambulate safely.   NEXT MD VISIT:   OBJECTIVE:  Note: Objective measures were completed at Evaluation unless otherwise noted.  DIAGNOSTIC FINDINGS:  DG Ribs  Unilateral Right 09/19/2023  Narrative & Impression  CLINICAL DATA:  Post fall, now with right-sided rib pain.   EXAM: RIGHT RIBS - 2 VIEW   COMPARISON:  Chest radiograph-09/18/2012   FINDINGS: Unchanged cardiac silhouette and mediastinal contours with atherosclerotic plaque within the thoracic aorta. No focal parenchymal opacities. No pleural effusion or pneumothorax. No evidence of edema.   No definite displaced right-sided rib fractures. Regional soft tissues appear normal. Surgical clips overlie the left breast.   IMPRESSION: 1. No definite displaced right-sided rib fractures. 2. No acute cardiopulmonary disease. 3. Aortic Atherosclerosis (ICD10-I70.0).     Electronically Signed   By: Robbi Childs M.D.   On: 09/21/2023 16:40      PATIENT SURVEYS:  ABC scale: 36.25 % (12/18/2023)  COGNITION: Overall cognitive status: Within functional limits for tasks assessed     SENSATION:   MUSCLE LENGTH:   POSTURE: forward flexed, decreased B knee extension  PALPATION:   LUMBAR ROM:   AROM eval  Flexion   Extension   Right lateral flexion   Left lateral flexion   Right rotation   Left rotation    (Blank rows = not tested)  LOWER EXTREMITY ROM:     Passive  Right eval Left eval  Hip flexion    Hip extension    Hip abduction    Hip adduction    Hip internal rotation    Hip external rotation    Knee flexion    Knee extension    Ankle dorsiflexion    Ankle plantarflexion    Ankle inversion    Ankle eversion     (Blank rows = not tested)  LOWER EXTREMITY MMT:    MMT Right eval Left eval  Hip flexion 4 4 at available range with L lateral thigh pulling   Hip extension (seated manually resisted) 4 3  Hip abduction (seated manually resisted) 4 4-  Hip adduction    Hip internal rotation    Hip external rotation    Knee flexion 5 4  Knee extension 5 5  Ankle dorsiflexion    Ankle plantarflexion  Ankle inversion    Ankle eversion     (Blank rows  = not tested)  LUMBAR SPECIAL TESTS:    FUNCTIONAL TESTS:  DGI: 7/24  GAIT: Distance walked: 40 ft Assistive device utilized: Single point cane Level of assistance: CGA Comments: antalgic, SPC on R hand, decreased stance L LE, decreased B knee extension, decreased step length, shuffling gait pattern whenever starting back up to walk after static position.    Pt tendency to reach too early for chair to sit, causing her center of gravity to leave her base of support.  Cues needed to walk to the chair and not to reach for the chair too early.   Cues for B femoral control to sit with proper hand placement.   Ascending stairs: R rail assist, reciprocal pattern, decreased B femoral control  Descaneind stairs: R rail assist, pt steps down with L LE first. 2 feet to a step, decreased B femoral control .    TREATMENT DATE: 12/18/2023                                                                                                                               Therapeutic activities  Sit <> stand multiple times throughout session with emphasis on femoral control and proper hand placement  Gait to sit onto regular chair with arms multiple times throughout session with cues for walking closer to the chair so as to not reach for it too early and lose balance. Cues to maintain her center of gravity over her base of support.    Improved exercise technique, movement at target joints, use of target muscles after mod verbal, visual, tactile cues.    Gait training  With [facility] throughout session whenever walking CGA  Cues for increasing hip and knee flexion for foot clearance as well as increasing step length.   With [facility] on R, from treatment room to front desk to SUV parked outside [facility] Cues for increasing hip and knee flexion for foot clearance as well as increasing step length.   Pt was recommended to utilize her rw at home for safety secondary to unsteadiness with SPC observed. Pt verbalized  understanding.        PATIENT EDUCATION:  Education details: POC Person educated: Patient Education method: Explanation Education comprehension: verbalized understanding  HOME EXERCISE PROGRAM:    ASSESSMENT:  CLINICAL IMPRESSION: Patient is a 83 y.o. female who was seen today for physical therapy evaluation and treatment for gait disturbance. She also demonstrates altered gait pattern (staggering/shuffling at times especially initially, decreased step length, decreased foot clearance, antalgic pattern, and unsteady), decreased trunk and B hip strength,  difficulty maintaining her center of gravity over base of support, fear of falling based on her ABC scale questionnaire, and decreased balance as evident with her low Dynamic Gait Index Score. Pt will benefit from skilled physical therapy services to address the aforementioned deficits.   OBJECTIVE IMPAIRMENTS: Abnormal gait, decreased balance, difficulty  walking, decreased strength, improper body mechanics, and postural dysfunction.   ACTIVITY LIMITATIONS: carrying, lifting, standing, squatting, stairs, and locomotion level  PARTICIPATION LIMITATIONS:   PERSONAL FACTORS: Age, Fitness, Past/current experiences, Time since onset of injury/illness/exacerbation, and 1-2 comorbidities: HTN, osteoporosis are also affecting patient's functional outcome.   REHAB POTENTIAL: Fair    CLINICAL DECISION MAKING: Stable/uncomplicated  EVALUATION COMPLEXITY: Low   GOALS: Goals reviewed with patient? Yes  SHORT TERM GOALS: Target date: 01/02/2024  Pt will be independent with her initial HEP to improve strength, femoral control, balance, and function.  Baseline: Pt has not yet started her initial HEP (12/18/2023) Goal status: INITIAL   LONG TERM GOALS: Target date: 02/13/2024  Pt will improve her Activities-specific Balance Confidence (ABC) Scale by at least 20% as a demonstration of improved balance.  Baseline: ABC scale sore:  36.25% Goal status: INITIAL  2.  Pt will improve her DGI score to 15/24 or more as a demonstration of improved balance.  Baseline: DGI score: 7/24 (12/18/2023) Goal status: INITIAL  3.  Pt will improve her B hip flexion, extension and abduction strength by at least 1/2 MMT to improve ability to ambulate, perform standing tasks more safely and with less difficulty.   Baseline:  MMT Right eval Left eval  Hip flexion 4 4 at available range with L lateral thigh pulling   Hip extension (seated manually resisted) 4 3  Hip abduction (seated manually resisted) 4 4-   (12/18/2023)  Goal status: INITIAL    PLAN:  PT FREQUENCY: 1-2x/week  PT DURATION: 8 weeks  PLANNED INTERVENTIONS: 97110-Therapeutic exercises, 97530- Therapeutic activity, [id number]- Neuromuscular re-education, 97535- Self Care, 11914- Manual therapy, [id number]- Gait training, [phone]- Canalith repositioning, [id number]- Electrical stimulation (unattended), Patient/Family education, Balance training, Stair training, and Vestibular training.  PLAN FOR NEXT SESSION: placing and maintaining center of gravity over her base of support, gait training, trunk and LE strengthening, proper mechanics, manual techniques, modalities PRN.   Gizel Riedlinger, PT, DPT 12/18/2023, 6:54 PM

## 2023-12-23 ENCOUNTER — Ambulatory Visit

## 2023-12-23 DIAGNOSIS — M6281 Muscle weakness (generalized): Secondary | ICD-10-CM

## 2023-12-23 DIAGNOSIS — Z9181 History of falling: Secondary | ICD-10-CM

## 2023-12-23 DIAGNOSIS — R262 Difficulty in walking, not elsewhere classified: Secondary | ICD-10-CM | POA: Diagnosis not present

## 2023-12-23 DIAGNOSIS — R2681 Unsteadiness on feet: Secondary | ICD-10-CM

## 2023-12-23 NOTE — Therapy (Signed)
 OUTPATIENT PHYSICAL THERAPY TREATMENT   Patient Name: Anna Johnson MRN: [id number] DOB:[birth year], 83 y.o., female Today's Date: 12/23/2023  END OF SESSION:  PT End of Session - 12/23/23 1350     Visit Number 2    Number of Visits 17    Date for PT Re-Evaluation 02/13/24    PT Start Time 1350    PT Stop Time 1432    PT Time Calculation (min) 42 min    Equipment Utilized During Treatment Gait belt    Activity Tolerance Patient tolerated treatment well    Behavior During Therapy WFL for tasks assessed/performed              Past Medical History:  Diagnosis Date   Allergy    Glaucoma    Hyperlipidemia    Hypertension    Osteoporosis    Pneumonia 03/29/2015   Past Surgical History:  Procedure Laterality Date   BREAST BIOPSY Left 03/16/2018   Affirm Biopsy- Ribbon clip- positive   BREAST LUMPECTOMY Left 04/2018   CATARACT EXTRACTION  2004   DILATION AND CURETTAGE OF UTERUS     HIP FRACTURE SURGERY  09/15/2010   left hip   WRIST FRACTURE SURGERY     ORIF   Patient Active Problem List   Diagnosis Date Noted   Chronic midline low back pain without sciatica 02/28/2021   Osteoarthritis of both shoulders 02/28/2021   Glaucoma of both eyes 04/06/2018   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 (HCC) 04/03/2018   Abnormal chest x-ray 05/08/2015   Bloodgood disease 05/08/2015   Thoracic, thoracolumbar and lumbosacral intervertebral disc disorder 05/08/2015   Osteoporosis 04/07/2015   Vitamin D  deficiency 03/29/2015   Open-angle glaucoma 03/29/2015   Hypertension 03/29/2015   Seasonal allergies 03/29/2015   Osteoarthritis 03/29/2015   Fibrocystic breast disease 03/29/2015   Compression fracture of L1 lumbar vertebra (HCC) 03/29/2015   History of hip fracture 03/29/2015   Hyperlipemia 02/28/2015    PCP: Lamon Pillow, MD   REFERRING PROVIDER: Lamon Pillow, MD   REFERRING DIAG: R26.9 (ICD-10-CM) - Gait disturbance  Rationale for  Evaluation and Treatment: Rehabilitation  THERAPY DIAG:  Difficulty in walking, not elsewhere classified  Unsteadiness on feet  Muscle weakness (generalized)  History of falling  ONSET DATE: September 17, 2023 (pt fell backwards reaching for bed post)  SUBJECTIVE:                                                                                                                                                                                           SUBJECTIVE STATEMENT: Doing ok. Feels more comfortable walking  holding the [facility] on her R side instead of her L side.   PERTINENT HISTORY:  Gait disturbance. Pt got up in the middle of the night to go to the bathroom. Pt reached towards her bed but missed and feel onto her back, pt states she reached too early. Felt fine afterwards except 2 days with B LE discomfort. LE is now fine but L LE just feels awkward. Has a hx of L hip fx S/P surgery 15 years ago. Pt states not having pain or anything but feels like she needs exercise.    Blood pressure is controlled per pt. No latex allergies     PAIN:  None   PRECAUTIONS: Fall  RED FLAGS: No known red flags   WEIGHT BEARING RESTRICTIONS: No  FALLS:  Has patient fallen in last 6 months? Yes. Number of falls 1 first week in September 16, 2023.   LIVING ENVIRONMENT: Lives with: lives with their spouse Lives in: House/apartment Stairs: Yes: Internal: 15 steps; on right going up and External: 5 steps; bilateral but cannot reach both Has following equipment at home: Single point cane, Walker - 4 wheeled, and shower chair  OCCUPATION: retired  PLOF: Independent with household mobility with device  PATIENT GOALS: improve balance, improve ability to ambulate safely.   NEXT MD VISIT:   OBJECTIVE:  Note: Objective measures were completed at Evaluation unless otherwise noted.  DIAGNOSTIC FINDINGS:  DG Ribs Unilateral Right 09/19/2023  Narrative & Impression  CLINICAL DATA:  Post fall, now  with right-sided rib pain.   EXAM: RIGHT RIBS - 2 VIEW   COMPARISON:  Chest radiograph-09/18/2012   FINDINGS: Unchanged cardiac silhouette and mediastinal contours with atherosclerotic plaque within the thoracic aorta. No focal parenchymal opacities. No pleural effusion or pneumothorax. No evidence of edema.   No definite displaced right-sided rib fractures. Regional soft tissues appear normal. Surgical clips overlie the left breast.   IMPRESSION: 1. No definite displaced right-sided rib fractures. 2. No acute cardiopulmonary disease. 3. Aortic Atherosclerosis (ICD10-I70.0).     Electronically Signed   By: Robbi Childs M.D.   On: 09/21/2023 16:40      PATIENT SURVEYS:  ABC scale: 36.25 % (12/18/2023)  COGNITION: Overall cognitive status: Within functional limits for tasks assessed     SENSATION:   MUSCLE LENGTH:   POSTURE: forward flexed, decreased B knee extension  PALPATION:   LUMBAR ROM:   AROM eval  Flexion   Extension   Right lateral flexion   Left lateral flexion   Right rotation   Left rotation    (Blank rows = not tested)  LOWER EXTREMITY ROM:     Passive  Right eval Left eval  Hip flexion    Hip extension    Hip abduction    Hip adduction    Hip internal rotation    Hip external rotation    Knee flexion    Knee extension    Ankle dorsiflexion    Ankle plantarflexion    Ankle inversion    Ankle eversion     (Blank rows = not tested)  LOWER EXTREMITY MMT:    MMT Right eval Left eval  Hip flexion 4 4 at available range with L lateral thigh pulling   Hip extension (seated manually resisted) 4 3  Hip abduction (seated manually resisted) 4 4-  Hip adduction    Hip internal rotation    Hip external rotation    Knee flexion 5 4  Knee extension 5 5  Ankle  dorsiflexion    Ankle plantarflexion    Ankle inversion    Ankle eversion     (Blank rows = not tested)  LUMBAR SPECIAL TESTS:    FUNCTIONAL TESTS:  DGI:  7/24  GAIT: Distance walked: 40 ft Assistive device utilized: Single point cane Level of assistance: CGA Comments: antalgic, SPC on R hand, decreased stance L LE, decreased B knee extension, decreased step length, shuffling gait pattern whenever starting back up to walk after static position.    Pt tendency to reach too early for chair to sit, causing her center of gravity to leave her base of support.  Cues needed to walk to the chair and not to reach for the chair too early.   Cues for B femoral control to sit with proper hand placement.   Ascending stairs: R rail assist, reciprocal pattern, decreased B femoral control  Descaneind stairs: R rail assist, pt steps down with L LE first. 2 feet to a step, decreased B femoral control .    TREATMENT DATE: 12/23/2023                                                                                                                                Neuromuscular re education   NuStep seat 5, arms 5, level 1 for 5 minutes to promote reciprocal, alternating movement  Sit <> stand from regular chair with arms with B UE assist, cues to place and maintain her center of gravity over her base of support 10x  Seated hip flexion 10x each LE alternating prior to standing up   Standing forward weight shifting onto forefeet with B UE assist to promote ankle strategy 10x5 seconds for 2 sets  Seated hip flexion with opposite shoulder flexion 10x6 each LE to promote coodination and reciprocal movement  Improved ability to ambulate initially afterwards from sitting  Gait with SPC throughout session whenever walking, CGA   with cues for increasing hip and knee flexion for foot clearance as well as increasing step length.    Improved exercise technique, movement at target joints, use of target muscles after mod verbal, visual, tactile cues.          PATIENT EDUCATION:  Education details: there-ex, HEP Person educated: Patient Education method:  Explanation, Demonstration, Tactile cues, Verbal cues, and Handouts Education comprehension: verbalized understanding, returned demonstration, verbal cues required, tactile cues required, and needs further education  HOME EXERCISE PROGRAM:  Seated hip flexion with opposite shoulder flexion 10x3 each LE to promote coodination and reciprocal movement  Picture provided   Access Code: [email or web address] URL: [email or web address] Date: 12/23/2023 Prepared by: Suzzane Estes  Exercises - Sit to Stand with Counter Support  - 3 x daily - 7 x weekly - 1 sets - 5 reps     ASSESSMENT:  CLINICAL IMPRESSION:  Worked on reciprocal/alternating UE/LE movements to help decrease shuffling gait with initial walking. Cues also needed for increasing hip and knee flexion and  step length to decrease shuffling gait pattern as well. Worked on forward weight shifting secondary to tendency for backwards weight shifting to promote ability to place and maintain her center of gravity over her base of support (feet). Pt tolerated session well without aggravation of symptoms. Pt will benefit from continued skilled physical therapy services to improve strength, balance, function, and gait.     OBJECTIVE IMPAIRMENTS: Abnormal gait, decreased balance, difficulty walking, decreased strength, improper body mechanics, and postural dysfunction.   ACTIVITY LIMITATIONS: carrying, lifting, standing, squatting, stairs, and locomotion level  PARTICIPATION LIMITATIONS:   PERSONAL FACTORS: Age, Fitness, Past/current experiences, Time since onset of injury/illness/exacerbation, and 1-2 comorbidities: HTN, osteoporosis are also affecting patient's functional outcome.   REHAB POTENTIAL: Fair    CLINICAL DECISION MAKING: Stable/uncomplicated  EVALUATION COMPLEXITY: Low   GOALS: Goals reviewed with patient? Yes  SHORT TERM GOALS: Target date: 01/02/2024  Pt will be independent with her initial HEP to improve  strength, femoral control, balance, and function.  Baseline: Pt has not yet started her initial HEP (12/18/2023) Goal status: INITIAL   LONG TERM GOALS: Target date: 02/13/2024  Pt will improve her Activities-specific Balance Confidence (ABC) Scale by at least 20% as a demonstration of improved balance.  Baseline: ABC scale sore: 36.25% Goal status: INITIAL  2.  Pt will improve her DGI score to 15/24 or more as a demonstration of improved balance.  Baseline: DGI score: 7/24 (12/18/2023) Goal status: INITIAL  3.  Pt will improve her B hip flexion, extension and abduction strength by at least 1/2 MMT to improve ability to ambulate, perform standing tasks more safely and with less difficulty.   Baseline:  MMT Right eval Left eval  Hip flexion 4 4 at available range with L lateral thigh pulling   Hip extension (seated manually resisted) 4 3  Hip abduction (seated manually resisted) 4 4-   (12/18/2023)  Goal status: INITIAL    PLAN:  PT FREQUENCY: 1-2x/week  PT DURATION: 8 weeks  PLANNED INTERVENTIONS: 97110-Therapeutic exercises, 97530- Therapeutic activity, [id number]- Neuromuscular re-education, 97535- Self Care, 29562- Manual therapy, [id number]- Gait training, [phone]- Canalith repositioning, [id number]- Electrical stimulation (unattended), Patient/Family education, Balance training, Stair training, and Vestibular training.  PLAN FOR NEXT SESSION: placing and maintaining center of gravity over her base of support, gait training, trunk and LE strengthening, proper mechanics, manual techniques, modalities PRN.   Stellan Vick, PT, DPT 12/23/2023, 4:22 PM

## 2023-12-30 ENCOUNTER — Encounter

## 2023-12-31 ENCOUNTER — Ambulatory Visit: Payer: Self-pay | Admitting: Family Medicine

## 2023-12-31 ENCOUNTER — Encounter

## 2024-01-01 ENCOUNTER — Ambulatory Visit

## 2024-01-01 ENCOUNTER — Other Ambulatory Visit: Payer: Self-pay | Admitting: Family Medicine

## 2024-01-01 DIAGNOSIS — M6281 Muscle weakness (generalized): Secondary | ICD-10-CM

## 2024-01-01 DIAGNOSIS — R262 Difficulty in walking, not elsewhere classified: Secondary | ICD-10-CM

## 2024-01-01 DIAGNOSIS — R2681 Unsteadiness on feet: Secondary | ICD-10-CM

## 2024-01-01 DIAGNOSIS — E785 Hyperlipidemia, unspecified: Secondary | ICD-10-CM

## 2024-01-01 DIAGNOSIS — Z9181 History of falling: Secondary | ICD-10-CM

## 2024-01-01 NOTE — Therapy (Signed)
 OUTPATIENT PHYSICAL THERAPY TREATMENT   Patient Name: Anna Johnson MRN: [id number] DOB:1941-08-08, 83 y.o., female Today's Date: 01/01/2024  END OF SESSION:  PT End of Session - 01/01/24 1023     Visit Number 3    Number of Visits 17    Date for PT Re-Evaluation 02/13/24    PT Start Time 1024    PT Stop Time 1117    PT Time Calculation (min) 53 min    Equipment Utilized During Treatment Gait belt    Activity Tolerance Patient tolerated treatment well    Behavior During Therapy [facility] for tasks assessed/performed               Past Medical History:  Diagnosis Date   Allergy    Glaucoma    Hyperlipidemia    Hypertension    Osteoporosis    Pneumonia 03/29/2015   Past Surgical History:  Procedure Laterality Date   BREAST BIOPSY Left 03/16/2018   Affirm Biopsy- Ribbon clip- positive   BREAST LUMPECTOMY Left 04/2018   CATARACT EXTRACTION  2004   DILATION AND CURETTAGE OF UTERUS     HIP FRACTURE SURGERY  09/15/2010   left hip   WRIST FRACTURE SURGERY     ORIF   Patient Active Problem List   Diagnosis Date Noted   Chronic midline low back pain without sciatica 02/28/2021   Osteoarthritis of both shoulders 02/28/2021   Glaucoma of both eyes 04/06/2018   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 (HCC) 04/03/2018   Abnormal chest x-ray 05/08/2015   Bloodgood disease 05/08/2015   Thoracic, thoracolumbar and lumbosacral intervertebral disc disorder 05/08/2015   Osteoporosis 04/07/2015   Vitamin D  deficiency 03/29/2015   Open-angle glaucoma 03/29/2015   Hypertension 03/29/2015   Seasonal allergies 03/29/2015   Osteoarthritis 03/29/2015   Fibrocystic breast disease 03/29/2015   Compression fracture of L1 lumbar vertebra (HCC) 03/29/2015   History of hip fracture 03/29/2015   Hyperlipemia 02/28/2015    PCP: Lamon Pillow, MD   REFERRING PROVIDER: Lamon Pillow, MD   REFERRING DIAG: R26.9 (ICD-10-CM) - Gait disturbance  Rationale for  Evaluation and Treatment: Rehabilitation  THERAPY DIAG:  Difficulty in walking, not elsewhere classified  Unsteadiness on feet  Muscle weakness (generalized)  History of falling  ONSET DATE: September 17, 2023 (pt fell backwards reaching for bed post)  SUBJECTIVE:                                                                                                                                                                                           SUBJECTIVE STATEMENT: Doing ok.  PERTINENT HISTORY:  Gait disturbance. Pt got up in the middle of the night to go to the bathroom. Pt reached towards her bed but missed and feel onto her back, pt states she reached too early. Felt fine afterwards except 2 days with B LE discomfort. LE is now fine but L LE just feels awkward. Has a hx of L hip fx S/P surgery 15 years ago. Pt states not having pain or anything but feels like she needs exercise.    Blood pressure is controlled per pt. No latex allergies     PAIN:  None   PRECAUTIONS: Fall  RED FLAGS: No known red flags   WEIGHT BEARING RESTRICTIONS: No  FALLS:  Has patient fallen in last 6 months? Yes. Number of falls 1 first week in September 16, 2023.   LIVING ENVIRONMENT: Lives with: lives with their spouse Lives in: House/apartment Stairs: Yes: Internal: 15 steps; on right going up and External: 5 steps; bilateral but cannot reach both Has following equipment at home: Single point cane, Walker - 4 wheeled, and shower chair  OCCUPATION: retired  PLOF: Independent with household mobility with device  PATIENT GOALS: improve balance, improve ability to ambulate safely.   NEXT MD VISIT:   OBJECTIVE:  Note: Objective measures were completed at Evaluation unless otherwise noted.  DIAGNOSTIC FINDINGS:  DG Ribs Unilateral Right 09/19/2023  Narrative & Impression  CLINICAL DATA:  Post fall, now with right-sided rib pain.   EXAM: RIGHT RIBS - 2 VIEW   COMPARISON:  Chest  radiograph-09/18/2012   FINDINGS: Unchanged cardiac silhouette and mediastinal contours with atherosclerotic plaque within the thoracic aorta. No focal parenchymal opacities. No pleural effusion or pneumothorax. No evidence of edema.   No definite displaced right-sided rib fractures. Regional soft tissues appear normal. Surgical clips overlie the left breast.   IMPRESSION: 1. No definite displaced right-sided rib fractures. 2. No acute cardiopulmonary disease. 3. Aortic Atherosclerosis (ICD10-I70.0).     Electronically Signed   By: Robbi Childs M.D.   On: 09/21/2023 16:40      PATIENT SURVEYS:  ABC scale: 36.25 % (12/18/2023)  COGNITION: Overall cognitive status: Within functional limits for tasks assessed     SENSATION:   MUSCLE LENGTH:   POSTURE: forward flexed, decreased B knee extension  PALPATION:   LUMBAR ROM:   AROM eval  Flexion   Extension   Right lateral flexion   Left lateral flexion   Right rotation   Left rotation    (Blank rows = not tested)  LOWER EXTREMITY ROM:     Passive  Right eval Left eval  Hip flexion    Hip extension    Hip abduction    Hip adduction    Hip internal rotation    Hip external rotation    Knee flexion    Knee extension    Ankle dorsiflexion    Ankle plantarflexion    Ankle inversion    Ankle eversion     (Blank rows = not tested)  LOWER EXTREMITY MMT:    MMT Right eval Left eval  Hip flexion 4 4 at available range with L lateral thigh pulling   Hip extension (seated manually resisted) 4 3  Hip abduction (seated manually resisted) 4 4-  Hip adduction    Hip internal rotation    Hip external rotation    Knee flexion 5 4  Knee extension 5 5  Ankle dorsiflexion    Ankle plantarflexion    Ankle inversion  Ankle eversion     (Blank rows = not tested)  LUMBAR SPECIAL TESTS:    FUNCTIONAL TESTS:  DGI: 7/24  GAIT: Distance walked: 40 ft Assistive device utilized: Single point cane Level of  assistance: CGA Comments: antalgic, SPC on R hand, decreased stance L LE, decreased B knee extension, decreased step length, shuffling gait pattern whenever starting back up to walk after static position.    Pt tendency to reach too early for chair to sit, causing her center of gravity to leave her base of support.  Cues needed to walk to the chair and not to reach for the chair too early.   Cues for B femoral control to sit with proper hand placement.   Ascending stairs: R rail assist, reciprocal pattern, decreased B femoral control  Descaneind stairs: R rail assist, pt steps down with L LE first. 2 feet to a step, decreased B femoral control .    TREATMENT DATE: 01/01/2024                                                                                                                                Gait Training   NuStep seat 5, arms 5, level 1 for 5 minutes to promote reciprocal, alternating movement  Cues to maintain at least 60 SPM then 70 SPM during the last minute   Gait with SPC on R, 200 ft with CGA, mod to max cues for increase step length as well as hip and knee flexion L LE > R.   L LE seems to have a difficult time initiating movement.   LAQ to promote knee extension during stance phase of gait for both LE  R 10x10 seconds for 2 sets  L 10x10 seconds for 2 sets  Gait with SPC on R, 75 ft with CGA, mod to max cues for increase step length as well as hip and knee flexion L LE > R.   L LE seems to have a difficult time initiating movement.    Then with son [place] Ding) for pt to practice with son at home 100 ft   Pt provided verbal permission.   Gait with rw 100 ft   Pt more steady.    Pt was recommended to use her rw at home from now on for safety. Pt and son verbalized understanding.     Pt son was educated with proper hand placement with pt for gait safety. Pt son verbalized and demonstrated understanding.     Improved exercise technique, movement at target joints,  use of target muscles after mod verbal, visual, tactile cues.          PATIENT EDUCATION:  Education details: there-ex, HEP Person educated: Patient Education method: Explanation, Demonstration, Tactile cues, Verbal cues, and Handouts Education comprehension: verbalized understanding, returned demonstration, verbal cues required, tactile cues required, and needs further education  HOME EXERCISE PROGRAM:  Seated hip flexion with opposite shoulder flexion 10x3 each LE  to promote coodination and reciprocal movement  Picture provided  Gait training with rw/SPC with son, with emphasis on hip and knee flexion, increase base of support and step length.   Pt and son [place] Ding) verbalized understanding   Access Code: [email or web address] URL: [email or web address] Date: 12/23/2023 Prepared by: Suzzane Estes  Exercises - Sit to Stand with Counter Support  - 3 x daily - 7 x weekly - 1 sets - 5 reps - Seated Long Arc Quad  - 3 x daily - 7 x weekly - 3 sets - 10 reps - 10 seconds hold     ASSESSMENT:  CLINICAL IMPRESSION:  Continued working on reciprocal/alternating UE/LE movements to help decrease shuffling gait with initial walking and mid walking. Cues also needed for increasing hip and knee flexion and step length to decrease shuffling gait pattern as well. Pt better able to ambulate with rw compared to [facility] with increased safety observed. Pt was recommended to use her rw instead of the [facility] for safety. Pt and son verbalized understanding. Pt son [place] Ding) was also educated on how to practice gait with pt (mother), using gait belt, proper hand placement, and cueing for increasing hip and knee flexion, step length, and base of support. Pt son verbalized and demonstrated understanding.  Pt tolerated session well without aggravation of symptoms. Pt will benefit from continued skilled physical therapy services to improve strength, balance, function, and gait.     OBJECTIVE IMPAIRMENTS:  Abnormal gait, decreased balance, difficulty walking, decreased strength, improper body mechanics, and postural dysfunction.   ACTIVITY LIMITATIONS: carrying, lifting, standing, squatting, stairs, and locomotion level  PARTICIPATION LIMITATIONS:   PERSONAL FACTORS: Age, Fitness, Past/current experiences, Time since onset of injury/illness/exacerbation, and 1-2 comorbidities: HTN, osteoporosis are also affecting patient's functional outcome.   REHAB POTENTIAL: Fair    CLINICAL DECISION MAKING: Stable/uncomplicated  EVALUATION COMPLEXITY: Low   GOALS: Goals reviewed with patient? Yes  SHORT TERM GOALS: Target date: 01/02/2024  Pt will be independent with her initial HEP to improve strength, femoral control, balance, and function.  Baseline: Pt has not yet started her initial HEP (12/18/2023) Goal status: INITIAL   LONG TERM GOALS: Target date: 02/13/2024  Pt will improve her Activities-specific Balance Confidence (ABC) Scale by at least 20% as a demonstration of improved balance.  Baseline: ABC scale sore: 36.25% Goal status: INITIAL  2.  Pt will improve her DGI score to 15/24 or more as a demonstration of improved balance.  Baseline: DGI score: 7/24 (12/18/2023) Goal status: INITIAL  3.  Pt will improve her B hip flexion, extension and abduction strength by at least 1/2 MMT to improve ability to ambulate, perform standing tasks more safely and with less difficulty.   Baseline:  MMT Right eval Left eval  Hip flexion 4 4 at available range with L lateral thigh pulling   Hip extension (seated manually resisted) 4 3  Hip abduction (seated manually resisted) 4 4-   (12/18/2023)  Goal status: INITIAL    PLAN:  PT FREQUENCY: 1-2x/week  PT DURATION: 8 weeks  PLANNED INTERVENTIONS: 97110-Therapeutic exercises, 97530- Therapeutic activity, [id number]- Neuromuscular re-education, 97535- Self Care, [id number]- Manual therapy, [id number]- Gait training, [phone]- Canalith repositioning, [id number]-  Electrical stimulation (unattended), Patient/Family education, Balance training, Stair training, and Vestibular training.  PLAN FOR NEXT SESSION: placing and maintaining center of gravity over her base of support, gait training, trunk and LE strengthening, proper mechanics, manual techniques, modalities PRN.   Dandra Velardi, PT, DPT 01/01/2024, 11:30 AM

## 2024-01-06 ENCOUNTER — Encounter

## 2024-01-08 ENCOUNTER — Telehealth: Payer: Self-pay

## 2024-01-08 ENCOUNTER — Ambulatory Visit

## 2024-01-08 NOTE — Telephone Encounter (Signed)
 No show. Called pt phone number. Unable to leave a message.

## 2024-01-14 ENCOUNTER — Ambulatory Visit: Attending: Family Medicine

## 2024-01-14 DIAGNOSIS — R2681 Unsteadiness on feet: Secondary | ICD-10-CM | POA: Insufficient documentation

## 2024-01-14 DIAGNOSIS — M6281 Muscle weakness (generalized): Secondary | ICD-10-CM | POA: Insufficient documentation

## 2024-01-14 DIAGNOSIS — Z9181 History of falling: Secondary | ICD-10-CM | POA: Insufficient documentation

## 2024-01-14 DIAGNOSIS — R262 Difficulty in walking, not elsewhere classified: Secondary | ICD-10-CM | POA: Insufficient documentation

## 2024-01-14 NOTE — Therapy (Signed)
 OUTPATIENT PHYSICAL THERAPY TREATMENT   Patient Name: Anna Johnson MRN: [id number] DOB:August 01, 1941, 83 y.o., female Today's Date: 01/14/2024  END OF SESSION:  PT End of Session - 01/14/24 1348     Visit Number 4    Number of Visits 17    Date for PT Re-Evaluation 02/13/24    PT Start Time 1349    PT Stop Time 1431    PT Time Calculation (min) 42 min    Equipment Utilized During Treatment Gait belt    Activity Tolerance Patient tolerated treatment well    Behavior During Therapy [facility] for tasks assessed/performed                Past Medical History:  Diagnosis Date   Allergy    Glaucoma    Hyperlipidemia    Hypertension    Osteoporosis    Pneumonia 03/29/2015   Past Surgical History:  Procedure Laterality Date   BREAST BIOPSY Left 03/16/2018   Affirm Biopsy- Ribbon clip- positive   BREAST LUMPECTOMY Left 04/2018   CATARACT EXTRACTION  2004   DILATION AND CURETTAGE OF UTERUS     HIP FRACTURE SURGERY  09/15/2010   left hip   WRIST FRACTURE SURGERY     ORIF   Patient Active Problem List   Diagnosis Date Noted   Chronic midline low back pain without sciatica 02/28/2021   Osteoarthritis of both shoulders 02/28/2021   Glaucoma of both eyes 04/06/2018   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 (HCC) 04/03/2018   Abnormal chest x-ray 05/08/2015   Bloodgood disease 05/08/2015   Thoracic, thoracolumbar and lumbosacral intervertebral disc disorder 05/08/2015   Osteoporosis 04/07/2015   Vitamin D  deficiency 03/29/2015   Open-angle glaucoma 03/29/2015   Hypertension 03/29/2015   Seasonal allergies 03/29/2015   Osteoarthritis 03/29/2015   Fibrocystic breast disease 03/29/2015   Compression fracture of L1 lumbar vertebra (HCC) 03/29/2015   History of hip fracture 03/29/2015   Hyperlipemia 02/28/2015    PCP: Lamon Pillow, MD   REFERRING PROVIDER: Lamon Pillow, MD   REFERRING DIAG: R26.9 (ICD-10-CM) - Gait disturbance  Rationale for  Evaluation and Treatment: Rehabilitation  THERAPY DIAG:  Difficulty in walking, not elsewhere classified  Unsteadiness on feet  Muscle weakness (generalized)  History of falling  ONSET DATE: September 17, 2023 (pt fell backwards reaching for bed post)  SUBJECTIVE:                                                                                                                                                                                           SUBJECTIVE STATEMENT: Has been practicing walking  with her [facility] with her son going into and out of stores.    PERTINENT HISTORY:  Gait disturbance. Pt got up in the middle of the night to go to the bathroom. Pt reached towards her bed but missed and feel onto her back, pt states she reached too early. Felt fine afterwards except 2 days with B LE discomfort. LE is now fine but L LE just feels awkward. Has a hx of L hip fx S/P surgery 15 years ago. Pt states not having pain or anything but feels like she needs exercise.    Blood pressure is controlled per pt. No latex allergies     PAIN:  None   PRECAUTIONS: Fall  RED FLAGS: No known red flags   WEIGHT BEARING RESTRICTIONS: No  FALLS:  Has patient fallen in last 6 months? Yes. Number of falls 1 first week in September 16, 2023.   LIVING ENVIRONMENT: Lives with: lives with their spouse Lives in: House/apartment Stairs: Yes: Internal: 15 steps; on right going up and External: 5 steps; bilateral but cannot reach both Has following equipment at home: Single point cane, Walker - 4 wheeled, and shower chair  OCCUPATION: retired  PLOF: Independent with household mobility with device  PATIENT GOALS: improve balance, improve ability to ambulate safely.   NEXT MD VISIT:   OBJECTIVE:  Note: Objective measures were completed at Evaluation unless otherwise noted.  DIAGNOSTIC FINDINGS:  DG Ribs Unilateral Right 09/19/2023  Narrative & Impression  CLINICAL DATA:  Post fall, now with  right-sided rib pain.   EXAM: RIGHT RIBS - 2 VIEW   COMPARISON:  Chest radiograph-09/18/2012   FINDINGS: Unchanged cardiac silhouette and mediastinal contours with atherosclerotic plaque within the thoracic aorta. No focal parenchymal opacities. No pleural effusion or pneumothorax. No evidence of edema.   No definite displaced right-sided rib fractures. Regional soft tissues appear normal. Surgical clips overlie the left breast.   IMPRESSION: 1. No definite displaced right-sided rib fractures. 2. No acute cardiopulmonary disease. 3. Aortic Atherosclerosis (ICD10-I70.0).     Electronically Signed   By: Robbi Childs M.D.   On: 09/21/2023 16:40      PATIENT SURVEYS:  ABC scale: 36.25 % (12/18/2023)  COGNITION: Overall cognitive status: Within functional limits for tasks assessed     SENSATION:   MUSCLE LENGTH:   POSTURE: forward flexed, decreased B knee extension  PALPATION:   LUMBAR ROM:   AROM eval  Flexion   Extension   Right lateral flexion   Left lateral flexion   Right rotation   Left rotation    (Blank rows = not tested)  LOWER EXTREMITY ROM:     Passive  Right eval Left eval  Hip flexion    Hip extension    Hip abduction    Hip adduction    Hip internal rotation    Hip external rotation    Knee flexion    Knee extension    Ankle dorsiflexion    Ankle plantarflexion    Ankle inversion    Ankle eversion     (Blank rows = not tested)  LOWER EXTREMITY MMT:    MMT Right eval Left eval  Hip flexion 4 4 at available range with L lateral thigh pulling   Hip extension (seated manually resisted) 4 3  Hip abduction (seated manually resisted) 4 4-  Hip adduction    Hip internal rotation    Hip external rotation    Knee flexion 5 4  Knee extension 5 5  Ankle dorsiflexion    Ankle plantarflexion    Ankle inversion    Ankle eversion     (Blank rows = not tested)  LUMBAR SPECIAL TESTS:    FUNCTIONAL TESTS:  DGI:  7/24  GAIT: Distance walked: 40 ft Assistive device utilized: Single point cane Level of assistance: CGA Comments: antalgic, SPC on R hand, decreased stance L LE, decreased B knee extension, decreased step length, shuffling gait pattern whenever starting back up to walk after static position.    Pt tendency to reach too early for chair to sit, causing her center of gravity to leave her base of support.  Cues needed to walk to the chair and not to reach for the chair too early.   Cues for B femoral control to sit with proper hand placement.   Ascending stairs: R rail assist, reciprocal pattern, decreased B femoral control  Descaneind stairs: R rail assist, pt steps down with L LE first. 2 feet to a step, decreased B femoral control .    TREATMENT DATE: 01/14/2024                                                                                                                                Gait Training  NuStep seat 5, arms 5, level 1 for 5 minutes to promote reciprocal, alternating movement  Cues to maintain at least 60 to 70 SPM  LAQ to promote knee extension during stance phase of gait for both LE  R 10x10 seconds for 2 sets  L 10x10 seconds for 2 sets   Sit <> stand with proper hand placement and weight shifting. 1x   Gait with SPC on R, 170 ft with CGA, cues for increasing step length and foot clearance.  LAQ to promote knee extension during stance phase of gait for both LE  R 10x10 seconds   L 10x10 seconds   Gait with SPC on R, 200 ft with CGA, mod to max cues for increase step length as well as hip and knee flexion L LE > R.   L LE seems to have a difficult time initiating movement.    Stepping over 2 mini hurdles with one UE assist and PT CGA 10x2, cues for placing her center of gravity over base of support as well as increasing foot clearance and step length.   Back foot tripped over mini hurdle 2x during first set.   Did not trip during second set.        Improved exercise technique, movement at target joints, use of target muscles after mod verbal, visual, tactile cues.          PATIENT EDUCATION:  Education details: there-ex, HEP Person educated: Patient Education method: Explanation, Demonstration, Tactile cues, Verbal cues, and Handouts Education comprehension: verbalized understanding, returned demonstration, verbal cues required, tactile cues required, and needs further education  HOME EXERCISE PROGRAM:  Seated hip flexion with opposite shoulder flexion 10x3 each LE  to promote coodination and reciprocal movement  Picture provided  Gait training with rw/SPC with son, with emphasis on hip and knee flexion, increase base of support and step length.   Pt and son Cortland Ding) verbalized understanding   Access Code: [email or web address] URL: [email or web address] Date: 12/23/2023 Prepared by: Suzzane Estes  Exercises - Sit to Stand with Counter Support  - 3 x daily - 7 x weekly - 1 sets - 5 reps - Seated Long Arc Quad  - 3 x daily - 7 x weekly - 3 sets - 10 reps - 10 seconds hold     ASSESSMENT:  CLINICAL IMPRESSION:  Improving step length during gait and improved gait initiation with less festination compared to previous session. Continued working on step length and foot clearance as well as obstacle negotiation to help decrease fall risk.   Pt tolerated session well without aggravation of symptoms. Pt will benefit from continued skilled physical therapy services to improve strength, balance, function, and gait.     OBJECTIVE IMPAIRMENTS: Abnormal gait, decreased balance, difficulty walking, decreased strength, improper body mechanics, and postural dysfunction.   ACTIVITY LIMITATIONS: carrying, lifting, standing, squatting, stairs, and locomotion level  PARTICIPATION LIMITATIONS:   PERSONAL FACTORS: Age, Fitness, Past/current experiences, Time since onset of injury/illness/exacerbation, and 1-2 comorbidities:  HTN, osteoporosis are also affecting patient's functional outcome.   REHAB POTENTIAL: Fair    CLINICAL DECISION MAKING: Stable/uncomplicated  EVALUATION COMPLEXITY: Low   GOALS: Goals reviewed with patient? Yes  SHORT TERM GOALS: Target date: 01/02/2024  Pt will be independent with her initial HEP to improve strength, femoral control, balance, and function.  Baseline: Pt has not yet started her initial HEP (12/18/2023) Goal status: INITIAL   LONG TERM GOALS: Target date: 02/13/2024  Pt will improve her Activities-specific Balance Confidence (ABC) Scale by at least 20% as a demonstration of improved balance.  Baseline: ABC scale sore: 36.25% Goal status: INITIAL  2.  Pt will improve her DGI score to 15/24 or more as a demonstration of improved balance.  Baseline: DGI score: 7/24 (12/18/2023) Goal status: INITIAL  3.  Pt will improve her B hip flexion, extension and abduction strength by at least 1/2 MMT to improve ability to ambulate, perform standing tasks more safely and with less difficulty.   Baseline:  MMT Right eval Left eval  Hip flexion 4 4 at available range with L lateral thigh pulling   Hip extension (seated manually resisted) 4 3  Hip abduction (seated manually resisted) 4 4-   (12/18/2023)  Goal status: INITIAL    PLAN:  PT FREQUENCY: 1-2x/week  PT DURATION: 8 weeks  PLANNED INTERVENTIONS: 97110-Therapeutic exercises, 97530- Therapeutic activity, [id number]- Neuromuscular re-education, 97535- Self Care, 16109- Manual therapy, [id number]- Gait training, [phone]- Canalith repositioning, U9811- Electrical stimulation (unattended), Patient/Family education, Balance training, Stair training, and Vestibular training.  PLAN FOR NEXT SESSION: placing and maintaining center of gravity over her base of support, gait training, trunk and LE strengthening, proper mechanics, manual techniques, modalities PRN.   Rayson Rando, PT, DPT 01/14/2024, 2:38 PM

## 2024-01-20 ENCOUNTER — Ambulatory Visit

## 2024-01-20 DIAGNOSIS — R262 Difficulty in walking, not elsewhere classified: Secondary | ICD-10-CM

## 2024-01-20 DIAGNOSIS — M6281 Muscle weakness (generalized): Secondary | ICD-10-CM

## 2024-01-20 DIAGNOSIS — R2681 Unsteadiness on feet: Secondary | ICD-10-CM

## 2024-01-20 DIAGNOSIS — Z9181 History of falling: Secondary | ICD-10-CM

## 2024-01-20 NOTE — Therapy (Signed)
 OUTPATIENT PHYSICAL THERAPY TREATMENT   Patient Name: Anna Johnson MRN: [id number] DOB:02/28/1941, 83 y.o., female Today's Date: 01/20/2024  END OF SESSION:  PT End of Session - 01/20/24 1026     Visit Number 5    Number of Visits 17    Date for PT Re-Evaluation 02/13/24    PT Start Time 0951    PT Stop Time 1031    PT Time Calculation (min) 40 min    Equipment Utilized During Treatment Gait belt    Activity Tolerance Patient tolerated treatment well    Behavior During Therapy [facility] for tasks assessed/performed                 Past Medical History:  Diagnosis Date   Allergy    Glaucoma    Hyperlipidemia    Hypertension    Osteoporosis    Pneumonia 03/29/2015   Past Surgical History:  Procedure Laterality Date   BREAST BIOPSY Left 03/16/2018   Affirm Biopsy- Ribbon clip- positive   BREAST LUMPECTOMY Left 04/2018   CATARACT EXTRACTION  2004   DILATION AND CURETTAGE OF UTERUS     HIP FRACTURE SURGERY  09/15/2010   left hip   WRIST FRACTURE SURGERY     ORIF   Patient Active Problem List   Diagnosis Date Noted   Chronic midline low back pain without sciatica 02/28/2021   Osteoarthritis of both shoulders 02/28/2021   Glaucoma of both eyes 04/06/2018   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 (HCC) 04/03/2018   Abnormal chest x-ray 05/08/2015   Bloodgood disease 05/08/2015   Thoracic, thoracolumbar and lumbosacral intervertebral disc disorder 05/08/2015   Osteoporosis 04/07/2015   Vitamin D  deficiency 03/29/2015   Open-angle glaucoma 03/29/2015   Hypertension 03/29/2015   Seasonal allergies 03/29/2015   Osteoarthritis 03/29/2015   Fibrocystic breast disease 03/29/2015   Compression fracture of L1 lumbar vertebra (HCC) 03/29/2015   History of hip fracture 03/29/2015   Hyperlipemia 02/28/2015    PCP: Lamon Pillow, MD   REFERRING PROVIDER: Lamon Pillow, MD   REFERRING DIAG: R26.9 (ICD-10-CM) - Gait disturbance  Rationale  for Evaluation and Treatment: Rehabilitation  THERAPY DIAG:  Difficulty in walking, not elsewhere classified  Unsteadiness on feet  Muscle weakness (generalized)  History of falling  ONSET DATE: September 17, 2023 (pt fell backwards reaching for bed post)  SUBJECTIVE:                                                                                                                                                                                           SUBJECTIVE STATEMENT: Doing fine.  PERTINENT HISTORY:  Gait disturbance. Pt got up in the middle of the night to go to the bathroom. Pt reached towards her bed but missed and feel onto her back, pt states she reached too early. Felt fine afterwards except 2 days with B LE discomfort. LE is now fine but L LE just feels awkward. Has a hx of L hip fx S/P surgery 15 years ago. Pt states not having pain or anything but feels like she needs exercise.    Blood pressure is controlled per pt. No latex allergies     PAIN:  None   PRECAUTIONS: Fall  RED FLAGS: No known red flags   WEIGHT BEARING RESTRICTIONS: No  FALLS:  Has patient fallen in last 6 months? Yes. Number of falls 1 first week in September 16, 2023.   LIVING ENVIRONMENT: Lives with: lives with their spouse Lives in: House/apartment Stairs: Yes: Internal: 15 steps; on right going up and External: 5 steps; bilateral but cannot reach both Has following equipment at home: Single point cane, Walker - 4 wheeled, and shower chair  OCCUPATION: retired  PLOF: Independent with household mobility with device  PATIENT GOALS: improve balance, improve ability to ambulate safely.   NEXT MD VISIT:   OBJECTIVE:  Note: Objective measures were completed at Evaluation unless otherwise noted.  DIAGNOSTIC FINDINGS:  DG Ribs Unilateral Right 09/19/2023  Narrative & Impression  CLINICAL DATA:  Post fall, now with right-sided rib pain.   EXAM: RIGHT RIBS - 2 VIEW   COMPARISON:   Chest radiograph-09/18/2012   FINDINGS: Unchanged cardiac silhouette and mediastinal contours with atherosclerotic plaque within the thoracic aorta. No focal parenchymal opacities. No pleural effusion or pneumothorax. No evidence of edema.   No definite displaced right-sided rib fractures. Regional soft tissues appear normal. Surgical clips overlie the left breast.   IMPRESSION: 1. No definite displaced right-sided rib fractures. 2. No acute cardiopulmonary disease. 3. Aortic Atherosclerosis (ICD10-I70.0).     Electronically Signed   By: Robbi Childs M.D.   On: 09/21/2023 16:40      PATIENT SURVEYS:  ABC scale: 36.25 % (12/18/2023)  COGNITION: Overall cognitive status: Within functional limits for tasks assessed     SENSATION:   MUSCLE LENGTH:   POSTURE: forward flexed, decreased B knee extension  PALPATION:   LUMBAR ROM:   AROM eval  Flexion   Extension   Right lateral flexion   Left lateral flexion   Right rotation   Left rotation    (Blank rows = not tested)  LOWER EXTREMITY ROM:     Passive  Right eval Left eval  Hip flexion    Hip extension    Hip abduction    Hip adduction    Hip internal rotation    Hip external rotation    Knee flexion    Knee extension    Ankle dorsiflexion    Ankle plantarflexion    Ankle inversion    Ankle eversion     (Blank rows = not tested)  LOWER EXTREMITY MMT:    MMT Right eval Left eval  Hip flexion 4 4 at available range with L lateral thigh pulling   Hip extension (seated manually resisted) 4 3  Hip abduction (seated manually resisted) 4 4-  Hip adduction    Hip internal rotation    Hip external rotation    Knee flexion 5 4  Knee extension 5 5  Ankle dorsiflexion    Ankle plantarflexion    Ankle inversion  Ankle eversion     (Blank rows = not tested)  LUMBAR SPECIAL TESTS:    FUNCTIONAL TESTS:  DGI: 7/24  GAIT: Distance walked: 40 ft Assistive device utilized: Single point cane Level  of assistance: CGA Comments: antalgic, SPC on R hand, decreased stance L LE, decreased B knee extension, decreased step length, shuffling gait pattern whenever starting back up to walk after static position.    Pt tendency to reach too early for chair to sit, causing her center of gravity to leave her base of support.  Cues needed to walk to the chair and not to reach for the chair too early.   Cues for B femoral control to sit with proper hand placement.   Ascending stairs: R rail assist, reciprocal pattern, decreased B femoral control  Descaneind stairs: R rail assist, pt steps down with L LE first. 2 feet to a step, decreased B femoral control .    TREATMENT DATE: 01/20/2024                                                                                                                                Gait Training  Performed to improve ability to ambulate with better foot clearance and step length.   NuStep seat 5, arms 5, level 1 for 5 minutes to promote reciprocal, alternating movement  Cues to maintain at least 60 to 70 SPM  Gait with SPC on R with cues for increasing step length 200 ft CGA   LAQ to promote knee extension during stance phase of gait for both LE  R 10x10 seconds for 2 sets  L 10x10 seconds for 2 sets   Standing static mini lunge with one UE assist CGA  R 10x2  L 10x2  Stepping over 2 mini hurdles with one UE assist and PT CGA 10x2, cues for placing her center of gravity over base of support as well as increasing foot clearance and step length.   Improved ability to perform after cues.     Improved exercise technique, movement at target joints, use of target muscles after mod verbal, visual, tactile cues.          PATIENT EDUCATION:  Education details: there-ex, HEP Person educated: Patient Education method: Explanation, Demonstration, Tactile cues, Verbal cues, and Handouts Education comprehension: verbalized understanding, returned  demonstration, verbal cues required, tactile cues required, and needs further education  HOME EXERCISE PROGRAM:  Seated hip flexion with opposite shoulder flexion 10x3 each LE to promote coodination and reciprocal movement  Picture provided  Gait training with rw/SPC with son, with emphasis on hip and knee flexion, increase base of support and step length.   Pt and son Cortland Ding) verbalized understanding   Access Code: [email or web address] URL: [email or web address] Date: 12/23/2023 Prepared by: Suzzane Estes  Exercises - Sit to Stand with Counter Support  - 3 x daily - 7 x weekly - 1 sets - 5 reps - Seated Long Arc Quad  -  3 x daily - 7 x weekly - 3 sets - 10 reps - 10 seconds hold     ASSESSMENT:  CLINICAL IMPRESSION:  Continued working on step length and foot clearance as well as obstacle negotiation to help decrease fall risk.   Pt tolerated session well without aggravation of symptoms. Pt will benefit from continued skilled physical therapy services to improve strength, balance, function, and gait.     OBJECTIVE IMPAIRMENTS: Abnormal gait, decreased balance, difficulty walking, decreased strength, improper body mechanics, and postural dysfunction.   ACTIVITY LIMITATIONS: carrying, lifting, standing, squatting, stairs, and locomotion level  PARTICIPATION LIMITATIONS:   PERSONAL FACTORS: Age, Fitness, Past/current experiences, Time since onset of injury/illness/exacerbation, and 1-2 comorbidities: HTN, osteoporosis are also affecting patient's functional outcome.   REHAB POTENTIAL: Fair    CLINICAL DECISION MAKING: Stable/uncomplicated  EVALUATION COMPLEXITY: Low   GOALS: Goals reviewed with patient? Yes  SHORT TERM GOALS: Target date: 01/02/2024  Pt will be independent with her initial HEP to improve strength, femoral control, balance, and function.  Baseline: Pt has not yet started her initial HEP (12/18/2023) Goal status: INITIAL   LONG TERM GOALS: Target  date: 02/13/2024  Pt will improve her Activities-specific Balance Confidence (ABC) Scale by at least 20% as a demonstration of improved balance.  Baseline: ABC scale sore: 36.25% Goal status: INITIAL  2.  Pt will improve her DGI score to 15/24 or more as a demonstration of improved balance.  Baseline: DGI score: 7/24 (12/18/2023) Goal status: INITIAL  3.  Pt will improve her B hip flexion, extension and abduction strength by at least 1/2 MMT to improve ability to ambulate, perform standing tasks more safely and with less difficulty.   Baseline:  MMT Right eval Left eval  Hip flexion 4 4 at available range with L lateral thigh pulling   Hip extension (seated manually resisted) 4 3  Hip abduction (seated manually resisted) 4 4-   (12/18/2023)  Goal status: INITIAL    PLAN:  PT FREQUENCY: 1-2x/week  PT DURATION: 8 weeks  PLANNED INTERVENTIONS: 97110-Therapeutic exercises, 97530- Therapeutic activity, [id number]- Neuromuscular re-education, 97535- Self Care, 16109- Manual therapy, [id number]- Gait training, [phone]- Canalith repositioning, U9811- Electrical stimulation (unattended), Patient/Family education, Balance training, Stair training, and Vestibular training.  PLAN FOR NEXT SESSION: placing and maintaining center of gravity over her base of support, gait training, trunk and LE strengthening, proper mechanics, manual techniques, modalities PRN.   Daneille Desilva, PT, DPT 01/20/2024, 4:35 PM

## 2024-01-22 ENCOUNTER — Ambulatory Visit

## 2024-01-22 DIAGNOSIS — R2681 Unsteadiness on feet: Secondary | ICD-10-CM

## 2024-01-22 DIAGNOSIS — Z9181 History of falling: Secondary | ICD-10-CM

## 2024-01-22 DIAGNOSIS — R262 Difficulty in walking, not elsewhere classified: Secondary | ICD-10-CM

## 2024-01-22 DIAGNOSIS — M6281 Muscle weakness (generalized): Secondary | ICD-10-CM

## 2024-01-22 NOTE — Therapy (Signed)
 OUTPATIENT PHYSICAL THERAPY TREATMENT   Patient Name: Anna Johnson MRN: [id number] DOB:05/30/1941, 83 y.o., female Today's Date: 01/22/2024  END OF SESSION:  PT End of Session - 01/22/24 0820     Visit Number 6    Number of Visits 17    Date for PT Re-Evaluation 02/13/24    PT Start Time 0820    PT Stop Time 0859    PT Time Calculation (min) 39 min    Equipment Utilized During Treatment Gait belt    Activity Tolerance Patient tolerated treatment well    Behavior During Therapy [facility] for tasks assessed/performed               Past Medical History:  Diagnosis Date   Allergy    Glaucoma    Hyperlipidemia    Hypertension    Osteoporosis    Pneumonia 03/29/2015   Past Surgical History:  Procedure Laterality Date   BREAST BIOPSY Left 03/16/2018   Affirm Biopsy- Ribbon clip- positive   BREAST LUMPECTOMY Left 04/2018   CATARACT EXTRACTION  2004   DILATION AND CURETTAGE OF UTERUS     HIP FRACTURE SURGERY  09/15/2010   left hip   WRIST FRACTURE SURGERY     ORIF   Patient Active Problem List   Diagnosis Date Noted   Chronic midline low back pain without sciatica 02/28/2021   Osteoarthritis of both shoulders 02/28/2021   Glaucoma of both eyes 04/06/2018   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 (HCC) 04/03/2018   Abnormal chest x-ray 05/08/2015   Bloodgood disease 05/08/2015   Thoracic, thoracolumbar and lumbosacral intervertebral disc disorder 05/08/2015   Osteoporosis 04/07/2015   Vitamin D  deficiency 03/29/2015   Open-angle glaucoma 03/29/2015   Hypertension 03/29/2015   Seasonal allergies 03/29/2015   Osteoarthritis 03/29/2015   Fibrocystic breast disease 03/29/2015   Compression fracture of L1 lumbar vertebra (HCC) 03/29/2015   History of hip fracture 03/29/2015   Hyperlipemia 02/28/2015    PCP: Lamon Pillow, MD   REFERRING PROVIDER: Lamon Pillow, MD   REFERRING DIAG: R26.9 (ICD-10-CM) - Gait disturbance  Rationale for  Evaluation and Treatment: Rehabilitation  THERAPY DIAG:  Difficulty in walking, not elsewhere classified  Unsteadiness on feet  Muscle weakness (generalized)  History of falling  ONSET DATE: September 17, 2023 (pt fell backwards reaching for bed post)  SUBJECTIVE:                                                                                                                                                                                           SUBJECTIVE STATEMENT: Feels like her walking is  a little better. Felt B thigh muscles after last session. Felt fine the day after.      PERTINENT HISTORY:  Gait disturbance. Pt got up in the middle of the night to go to the bathroom. Pt reached towards her bed but missed and feel onto her back, pt states she reached too early. Felt fine afterwards except 2 days with B LE discomfort. LE is now fine but L LE just feels awkward. Has a hx of L hip fx S/P surgery 15 years ago. Pt states not having pain or anything but feels like she needs exercise.    Blood pressure is controlled per pt. No latex allergies     PAIN:  None   PRECAUTIONS: Fall  RED FLAGS: No known red flags   WEIGHT BEARING RESTRICTIONS: No  FALLS:  Has patient fallen in last 6 months? Yes. Number of falls 1 first week in September 16, 2023.   LIVING ENVIRONMENT: Lives with: lives with their spouse Lives in: House/apartment Stairs: Yes: Internal: 15 steps; on right going up and External: 5 steps; bilateral but cannot reach both Has following equipment at home: Single point cane, Walker - 4 wheeled, and shower chair  OCCUPATION: retired  PLOF: Independent with household mobility with device  PATIENT GOALS: improve balance, improve ability to ambulate safely.   NEXT MD VISIT:   OBJECTIVE:  Note: Objective measures were completed at Evaluation unless otherwise noted.  DIAGNOSTIC FINDINGS:  DG Ribs Unilateral Right 09/19/2023  Narrative & Impression  CLINICAL  DATA:  Post fall, now with right-sided rib pain.   EXAM: RIGHT RIBS - 2 VIEW   COMPARISON:  Chest radiograph-09/18/2012   FINDINGS: Unchanged cardiac silhouette and mediastinal contours with atherosclerotic plaque within the thoracic aorta. No focal parenchymal opacities. No pleural effusion or pneumothorax. No evidence of edema.   No definite displaced right-sided rib fractures. Regional soft tissues appear normal. Surgical clips overlie the left breast.   IMPRESSION: 1. No definite displaced right-sided rib fractures. 2. No acute cardiopulmonary disease. 3. Aortic Atherosclerosis (ICD10-I70.0).     Electronically Signed   By: Robbi Childs M.D.   On: 09/21/2023 16:40      PATIENT SURVEYS:  ABC scale: 36.25 % (12/18/2023)  COGNITION: Overall cognitive status: Within functional limits for tasks assessed     SENSATION:   MUSCLE LENGTH:   POSTURE: forward flexed, decreased B knee extension  PALPATION:   LUMBAR ROM:   AROM eval  Flexion   Extension   Right lateral flexion   Left lateral flexion   Right rotation   Left rotation    (Blank rows = not tested)  LOWER EXTREMITY ROM:     Passive  Right eval Left eval  Hip flexion    Hip extension    Hip abduction    Hip adduction    Hip internal rotation    Hip external rotation    Knee flexion    Knee extension    Ankle dorsiflexion    Ankle plantarflexion    Ankle inversion    Ankle eversion     (Blank rows = not tested)  LOWER EXTREMITY MMT:    MMT Right eval Left eval  Hip flexion 4 4 at available range with L lateral thigh pulling   Hip extension (seated manually resisted) 4 3  Hip abduction (seated manually resisted) 4 4-  Hip adduction    Hip internal rotation    Hip external rotation    Knee flexion 5 4  Knee extension 5 5  Ankle dorsiflexion    Ankle plantarflexion    Ankle inversion    Ankle eversion     (Blank rows = not tested)  LUMBAR SPECIAL TESTS:    FUNCTIONAL TESTS:   DGI: 7/24  GAIT: Distance walked: 40 ft Assistive device utilized: Single point cane Level of assistance: CGA Comments: antalgic, SPC on R hand, decreased stance L LE, decreased B knee extension, decreased step length, shuffling gait pattern whenever starting back up to walk after static position.    Pt tendency to reach too early for chair to sit, causing her center of gravity to leave her base of support.  Cues needed to walk to the chair and not to reach for the chair too early.   Cues for B femoral control to sit with proper hand placement.   Ascending stairs: R rail assist, reciprocal pattern, decreased B femoral control  Descaneind stairs: R rail assist, pt steps down with L LE first. 2 feet to a step, decreased B femoral control .    TREATMENT DATE: 01/22/2024                                                                                                                                Gait Training  Performed to improve ability to ambulate with better foot clearance and step length.   NuStep seat 5, arms 5, level 1 for 5 minutes to promote reciprocal, alternating movement  Cues to maintain at least 60 to 70 SPM  Stepping over 2 mini hurdles with one UE assist and PT CGA 10x, cues for placing her center of gravity over base of support as well as increasing foot clearance and step length.     Gait with SPC on R with cues for increasing step length 200 ft CGA   Stepping over 2 mini hurdles with one UE assist and PT CGA 10x, cues for placing her center of gravity over base of support as well as increasing foot clearance and step length.   Standing with B UE assist   Hip abduction    R 10x   L 10x  Seated clamshell, hips less than 90 degrees flexion   red band    10x3  Standing static mini lunge with one UE assist CGA  R 10x3  L 10x3    Gait with SPC on R with cues for increasing step length 100 ft CGA    Improved exercise technique, movement at target  joints, use of target muscles after mod verbal, visual, tactile cues.          PATIENT EDUCATION:  Education details: there-ex, HEP Person educated: Patient Education method: Explanation, Demonstration, Tactile cues, Verbal cues, and Handouts Education comprehension: verbalized understanding, returned demonstration, verbal cues required, tactile cues required, and needs further education  HOME EXERCISE PROGRAM:  Seated hip flexion with opposite shoulder flexion 10x3 each LE to promote coodination and reciprocal movement  Picture provided  Gait training with rw/SPC with son, with emphasis on hip and knee flexion, increase base of support and step length.   Pt and son Anna Johnson) verbalized understanding   Access Code: [email or web address] URL: [email or web address] Date: 12/23/2023 Prepared by: Suzzane Estes  Exercises - Sit to Stand with Counter Support  - 3 x daily - 7 x weekly - 1 sets - 5 reps - Seated Long Arc Quad  - 3 x daily - 7 x weekly - 3 sets - 10 reps - 10 seconds hold     ASSESSMENT:  CLINICAL IMPRESSION:   Improving hip and knee flexion and step length for obstacle clearance observed especially after cues. Continued working on step length and foot clearance as well as obstacle negotiation to help decrease fall risk.   Pt tolerated session well without aggravation of symptoms. Pt will benefit from continued skilled physical therapy services to improve strength, balance, function, and gait.     OBJECTIVE IMPAIRMENTS: Abnormal gait, decreased balance, difficulty walking, decreased strength, improper body mechanics, and postural dysfunction.   ACTIVITY LIMITATIONS: carrying, lifting, standing, squatting, stairs, and locomotion level  PARTICIPATION LIMITATIONS:   PERSONAL FACTORS: Age, Fitness, Past/current experiences, Time since onset of injury/illness/exacerbation, and 1-2 comorbidities: HTN, osteoporosis are also affecting patient's functional outcome.    REHAB POTENTIAL: Fair    CLINICAL DECISION MAKING: Stable/uncomplicated  EVALUATION COMPLEXITY: Low   GOALS: Goals reviewed with patient? Yes  SHORT TERM GOALS: Target date: 01/02/2024  Pt will be independent with her initial HEP to improve strength, femoral control, balance, and function.  Baseline: Pt has not yet started her initial HEP (12/18/2023) Goal status: INITIAL   LONG TERM GOALS: Target date: 02/13/2024  Pt will improve her Activities-specific Balance Confidence (ABC) Scale by at least 20% as a demonstration of improved balance.  Baseline: ABC scale sore: 36.25% Goal status: INITIAL  2.  Pt will improve her DGI score to 15/24 or more as a demonstration of improved balance.  Baseline: DGI score: 7/24 (12/18/2023) Goal status: INITIAL  3.  Pt will improve her B hip flexion, extension and abduction strength by at least 1/2 MMT to improve ability to ambulate, perform standing tasks more safely and with less difficulty.   Baseline:  MMT Right eval Left eval  Hip flexion 4 4 at available range with L lateral thigh pulling   Hip extension (seated manually resisted) 4 3  Hip abduction (seated manually resisted) 4 4-   (12/18/2023)  Goal status: INITIAL    PLAN:  PT FREQUENCY: 1-2x/week  PT DURATION: 8 weeks  PLANNED INTERVENTIONS: 97110-Therapeutic exercises, 97530- Therapeutic activity, [id number]- Neuromuscular re-education, 97535- Self Care, [id number]- Manual therapy, [id number]- Gait training, [id number]- Canalith repositioning, [id number]- Electrical stimulation (unattended), Patient/Family education, Balance training, Stair training, and Vestibular training.  PLAN FOR NEXT SESSION: placing and maintaining center of gravity over her base of support, gait training, trunk and LE strengthening, proper mechanics, manual techniques, modalities PRN.   Darielle Hancher, PT, DPT 01/22/2024, 4:06 PM

## 2024-01-27 ENCOUNTER — Ambulatory Visit

## 2024-01-27 DIAGNOSIS — M6281 Muscle weakness (generalized): Secondary | ICD-10-CM

## 2024-01-27 DIAGNOSIS — R262 Difficulty in walking, not elsewhere classified: Secondary | ICD-10-CM | POA: Diagnosis not present

## 2024-01-27 DIAGNOSIS — R2681 Unsteadiness on feet: Secondary | ICD-10-CM

## 2024-01-27 DIAGNOSIS — Z9181 History of falling: Secondary | ICD-10-CM

## 2024-01-27 NOTE — Therapy (Signed)
 OUTPATIENT PHYSICAL THERAPY TREATMENT   Patient Name: Anna Johnson MRN: [id number] DOB:Jan 03, 1941, 83 y.o., female Today's Date: 01/27/2024  END OF SESSION:  PT End of Session - 01/27/24 1111     Visit Number 7    Number of Visits 17    Date for PT Re-Evaluation 02/13/24    PT Start Time 1030    PT Stop Time 1111    PT Time Calculation (min) 41 min    Equipment Utilized During Treatment Gait belt    Activity Tolerance Patient tolerated treatment well    Behavior During Therapy [facility] for tasks assessed/performed                Past Medical History:  Diagnosis Date   Allergy    Glaucoma    Hyperlipidemia    Hypertension    Osteoporosis    Pneumonia 03/29/2015   Past Surgical History:  Procedure Laterality Date   BREAST BIOPSY Left 03/16/2018   Affirm Biopsy- Ribbon clip- positive   BREAST LUMPECTOMY Left 04/2018   CATARACT EXTRACTION  2004   DILATION AND CURETTAGE OF UTERUS     HIP FRACTURE SURGERY  09/15/2010   left hip   WRIST FRACTURE SURGERY     ORIF   Patient Active Problem List   Diagnosis Date Noted   Chronic midline low back pain without sciatica 02/28/2021   Osteoarthritis of both shoulders 02/28/2021   Glaucoma of both eyes 04/06/2018   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 (HCC) 04/03/2018   Abnormal chest x-ray 05/08/2015   Bloodgood disease 05/08/2015   Thoracic, thoracolumbar and lumbosacral intervertebral disc disorder 05/08/2015   Osteoporosis 04/07/2015   Vitamin D  deficiency 03/29/2015   Open-angle glaucoma 03/29/2015   Hypertension 03/29/2015   Seasonal allergies 03/29/2015   Osteoarthritis 03/29/2015   Fibrocystic breast disease 03/29/2015   Compression fracture of L1 lumbar vertebra (HCC) 03/29/2015   History of hip fracture 03/29/2015   Hyperlipemia 02/28/2015    PCP: Lamon Pillow, MD   REFERRING PROVIDER: Lamon Pillow, MD   REFERRING DIAG: R26.9 (ICD-10-CM) - Gait disturbance  Rationale for  Evaluation and Treatment: Rehabilitation  THERAPY DIAG:  Difficulty in walking, not elsewhere classified  Unsteadiness on feet  Muscle weakness (generalized)  History of falling  ONSET DATE: September 17, 2023 (pt fell backwards reaching for bed post)  SUBJECTIVE:                                                                                                                                                                                           SUBJECTIVE STATEMENT: Doing ok.  PERTINENT HISTORY:  Gait disturbance. Pt got up in the middle of the night to go to the bathroom. Pt reached towards her bed but missed and feel onto her back, pt states she reached too early. Felt fine afterwards except 2 days with B LE discomfort. LE is now fine but L LE just feels awkward. Has a hx of L hip fx S/P surgery 15 years ago. Pt states not having pain or anything but feels like she needs exercise.    Blood pressure is controlled per pt. No latex allergies     PAIN:  None   PRECAUTIONS: Fall  RED FLAGS: No known red flags   WEIGHT BEARING RESTRICTIONS: No  FALLS:  Has patient fallen in last 6 months? Yes. Number of falls 1 first week in September 16, 2023.   LIVING ENVIRONMENT: Lives with: lives with their spouse Lives in: House/apartment Stairs: Yes: Internal: 15 steps; on right going up and External: 5 steps; bilateral but cannot reach both Has following equipment at home: Single point cane, Walker - 4 wheeled, and shower chair  OCCUPATION: retired  PLOF: Independent with household mobility with device  PATIENT GOALS: improve balance, improve ability to ambulate safely.   NEXT MD VISIT:   OBJECTIVE:  Note: Objective measures were completed at Evaluation unless otherwise noted.  DIAGNOSTIC FINDINGS:  DG Ribs Unilateral Right 09/19/2023  Narrative & Impression  CLINICAL DATA:  Post fall, now with right-sided rib pain.   EXAM: RIGHT RIBS - 2 VIEW   COMPARISON:   Chest radiograph-09/18/2012   FINDINGS: Unchanged cardiac silhouette and mediastinal contours with atherosclerotic plaque within the thoracic aorta. No focal parenchymal opacities. No pleural effusion or pneumothorax. No evidence of edema.   No definite displaced right-sided rib fractures. Regional soft tissues appear normal. Surgical clips overlie the left breast.   IMPRESSION: 1. No definite displaced right-sided rib fractures. 2. No acute cardiopulmonary disease. 3. Aortic Atherosclerosis (ICD10-I70.0).     Electronically Signed   By: Robbi Childs M.D.   On: 09/21/2023 16:40      PATIENT SURVEYS:  ABC scale: 36.25 % (12/18/2023)  COGNITION: Overall cognitive status: Within functional limits for tasks assessed     SENSATION:   MUSCLE LENGTH:   POSTURE: forward flexed, decreased B knee extension  PALPATION:   LUMBAR ROM:   AROM eval  Flexion   Extension   Right lateral flexion   Left lateral flexion   Right rotation   Left rotation    (Blank rows = not tested)  LOWER EXTREMITY ROM:     Passive  Right eval Left eval  Hip flexion    Hip extension    Hip abduction    Hip adduction    Hip internal rotation    Hip external rotation    Knee flexion    Knee extension    Ankle dorsiflexion    Ankle plantarflexion    Ankle inversion    Ankle eversion     (Blank rows = not tested)  LOWER EXTREMITY MMT:    MMT Right eval Left eval  Hip flexion 4 4 at available range with L lateral thigh pulling   Hip extension (seated manually resisted) 4 3  Hip abduction (seated manually resisted) 4 4-  Hip adduction    Hip internal rotation    Hip external rotation    Knee flexion 5 4  Knee extension 5 5  Ankle dorsiflexion    Ankle plantarflexion    Ankle inversion  Ankle eversion     (Blank rows = not tested)  LUMBAR SPECIAL TESTS:    FUNCTIONAL TESTS:  DGI: 7/24  GAIT: Distance walked: 40 ft Assistive device utilized: Single point cane Level  of assistance: CGA Comments: antalgic, SPC on R hand, decreased stance L LE, decreased B knee extension, decreased step length, shuffling gait pattern whenever starting back up to walk after static position.    Pt tendency to reach too early for chair to sit, causing her center of gravity to leave her base of support.  Cues needed to walk to the chair and not to reach for the chair too early.   Cues for B femoral control to sit with proper hand placement.   Ascending stairs: R rail assist, reciprocal pattern, decreased B femoral control  Descaneind stairs: R rail assist, pt steps down with L LE first. 2 feet to a step, decreased B femoral control .    TREATMENT DATE: 01/27/2024                                                                                                                                Gait Training  Performed to improve ability to ambulate with better foot clearance and step length.   NuStep seat 5, arms 5, level 1 for 5 minutes to promote reciprocal, alternating movement  Cues to maintain at least 70 SPM  B gastroc soreness   Seated hip adduction isometrics small blue ball squeeze 10x5 seconds for 2 sets  No B gastroc soreness reported afterwards  Standing with B UE assist   Hip abduction    R 10x3   L 10x3  Side step 30 ft to the L and 30 ft to the R 2x  Gait with SPC on R side 30 ft 5x with emphasis in improving step length.    Stepping over 2 mini hurdles with R SPC assist and PT CGA to min A   10x. LOB 2x with PT min A to recover.   Then another 10x   LOB 2x with PT mod A to recover. Cues needed for foot clearance and to get closer to the obstacle prior to stepping over.    Improved exercise technique, movement at target joints, use of target muscles after mod verbal, visual, tactile cues.          PATIENT EDUCATION:  Education details: there-ex, HEP Person educated: Patient Education method: Explanation, Demonstration, Tactile cues, Verbal  cues, and Handouts Education comprehension: verbalized understanding, returned demonstration, verbal cues required, tactile cues required, and needs further education  HOME EXERCISE PROGRAM:  Seated hip flexion with opposite shoulder flexion 10x3 each LE to promote coodination and reciprocal movement  Picture provided  Gait training with rw/SPC with son, with emphasis on hip and knee flexion, increase base of support and step length.   Pt and son Cortland Ding) verbalized understanding   Access Code: [email or web address] URL: [email or web address] Date: 12/23/2023 Prepared by: Kreg Pesa  Adelyne Marchese  Exercises - Sit to Stand with Counter Support  - 3 x daily - 7 x weekly - 1 sets - 5 reps - Seated Long Arc Quad  - 3 x daily - 7 x weekly - 3 sets - 10 reps - 10 seconds hold     ASSESSMENT:  CLINICAL IMPRESSION: Continued working on step length and foot clearance as well as obstacle negotiation to help decrease fall risk.  Cues needed to pick feet up and increase step length for better foot clearance. Difficulty performing when stepping over obstacles. Pt tolerated session well without aggravation of symptoms. Pt will benefit from continued skilled physical therapy services to improve strength, balance, function, and gait.     OBJECTIVE IMPAIRMENTS: Abnormal gait, decreased balance, difficulty walking, decreased strength, improper body mechanics, and postural dysfunction.   ACTIVITY LIMITATIONS: carrying, lifting, standing, squatting, stairs, and locomotion level  PARTICIPATION LIMITATIONS:   PERSONAL FACTORS: Age, Fitness, Past/current experiences, Time since onset of injury/illness/exacerbation, and 1-2 comorbidities: HTN, osteoporosis are also affecting patient's functional outcome.   REHAB POTENTIAL: Fair    CLINICAL DECISION MAKING: Stable/uncomplicated  EVALUATION COMPLEXITY: Low   GOALS: Goals reviewed with patient? Yes  SHORT TERM GOALS: Target date: 01/02/2024  Pt will be  independent with her initial HEP to improve strength, femoral control, balance, and function.  Baseline: Pt has not yet started her initial HEP (12/18/2023) Goal status: INITIAL   LONG TERM GOALS: Target date: 02/13/2024  Pt will improve her Activities-specific Balance Confidence (ABC) Scale by at least 20% as a demonstration of improved balance.  Baseline: ABC scale sore: 36.25% Goal status: INITIAL  2.  Pt will improve her DGI score to 15/24 or more as a demonstration of improved balance.  Baseline: DGI score: 7/24 (12/18/2023) Goal status: INITIAL  3.  Pt will improve her B hip flexion, extension and abduction strength by at least 1/2 MMT to improve ability to ambulate, perform standing tasks more safely and with less difficulty.   Baseline:  MMT Right eval Left eval  Hip flexion 4 4 at available range with L lateral thigh pulling   Hip extension (seated manually resisted) 4 3  Hip abduction (seated manually resisted) 4 4-   (12/18/2023)  Goal status: INITIAL    PLAN:  PT FREQUENCY: 1-2x/week  PT DURATION: 8 weeks  PLANNED INTERVENTIONS: 97110-Therapeutic exercises, 97530- Therapeutic activity, [id number]- Neuromuscular re-education, 97535- Self Care, [id number]- Manual therapy, [id number]- Gait training, [id number]- Canalith repositioning, U9811- Electrical stimulation (unattended), Patient/Family education, Balance training, Stair training, and Vestibular training.  PLAN FOR NEXT SESSION: placing and maintaining center of gravity over her base of support, gait training, trunk and LE strengthening, proper mechanics, manual techniques, modalities PRN.   Annalaura Sauseda, PT, DPT 01/27/2024, 2:43 PM

## 2024-01-30 ENCOUNTER — Ambulatory Visit

## 2024-01-30 DIAGNOSIS — M6281 Muscle weakness (generalized): Secondary | ICD-10-CM

## 2024-01-30 DIAGNOSIS — R262 Difficulty in walking, not elsewhere classified: Secondary | ICD-10-CM | POA: Diagnosis not present

## 2024-01-30 DIAGNOSIS — R2681 Unsteadiness on feet: Secondary | ICD-10-CM

## 2024-01-30 DIAGNOSIS — Z9181 History of falling: Secondary | ICD-10-CM

## 2024-01-30 NOTE — Therapy (Signed)
 OUTPATIENT PHYSICAL THERAPY TREATMENT   Patient Name: Anna Johnson MRN: [id number] DOB:10/18/1940, 83 y.o., female Today's Date: 01/30/2024  END OF SESSION:  PT End of Session - 01/30/24 1039     Visit Number 8    Number of Visits 17    Date for PT Re-Evaluation 02/13/24    PT Start Time 1039    PT Stop Time 1118    PT Time Calculation (min) 39 min    Equipment Utilized During Treatment Gait belt    Activity Tolerance Patient tolerated treatment well    Behavior During Therapy WFL for tasks assessed/performed                 Past Medical History:  Diagnosis Date   Allergy    Glaucoma    Hyperlipidemia    Hypertension    Osteoporosis    Pneumonia 03/29/2015   Past Surgical History:  Procedure Laterality Date   BREAST BIOPSY Left 03/16/2018   Affirm Biopsy- Ribbon clip- positive   BREAST LUMPECTOMY Left 04/2018   CATARACT EXTRACTION  2004   DILATION AND CURETTAGE OF UTERUS     HIP FRACTURE SURGERY  09/15/2010   left hip   WRIST FRACTURE SURGERY     ORIF   Patient Active Problem List   Diagnosis Date Noted   Chronic midline low back pain without sciatica 02/28/2021   Osteoarthritis of both shoulders 02/28/2021   Glaucoma of both eyes 04/06/2018   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 (HCC) 04/03/2018   Abnormal chest x-ray 05/08/2015   Bloodgood disease 05/08/2015   Thoracic, thoracolumbar and lumbosacral intervertebral disc disorder 05/08/2015   Osteoporosis 04/07/2015   Vitamin D  deficiency 03/29/2015   Open-angle glaucoma 03/29/2015   Hypertension 03/29/2015   Seasonal allergies 03/29/2015   Osteoarthritis 03/29/2015   Fibrocystic breast disease 03/29/2015   Compression fracture of L1 lumbar vertebra (HCC) 03/29/2015   History of hip fracture 03/29/2015   Hyperlipemia 02/28/2015    PCP: Lamon Pillow, MD   REFERRING PROVIDER: Lamon Pillow, MD   REFERRING DIAG: R26.9 (ICD-10-CM) - Gait disturbance  Rationale  for Evaluation and Treatment: Rehabilitation  THERAPY DIAG:  Difficulty in walking, not elsewhere classified  Unsteadiness on feet  Muscle weakness (generalized)  History of falling  ONSET DATE: September 17, 2023 (pt fell backwards reaching for bed post)  SUBJECTIVE:                                                                                                                                                                                           SUBJECTIVE STATEMENT: A little tired.  Stayed up late last night doing cleaning around the washing machine area.       PERTINENT HISTORY:  Gait disturbance. Pt got up in the middle of the night to go to the bathroom. Pt reached towards her bed but missed and feel onto her back, pt states she reached too early. Felt fine afterwards except 2 days with B LE discomfort. LE is now fine but L LE just feels awkward. Has a hx of L hip fx S/P surgery 15 years ago. Pt states not having pain or anything but feels like she needs exercise.    Blood pressure is controlled per pt. No latex allergies     PAIN:  None   PRECAUTIONS: Fall  RED FLAGS: No known red flags   WEIGHT BEARING RESTRICTIONS: No  FALLS:  Has patient fallen in last 6 months? Yes. Number of falls 1 first week in September 16, 2023.   LIVING ENVIRONMENT: Lives with: lives with their spouse Lives in: House/apartment Stairs: Yes: Internal: 15 steps; on right going up and External: 5 steps; bilateral but cannot reach both Has following equipment at home: Single point cane, Walker - 4 wheeled, and shower chair  OCCUPATION: retired  PLOF: Independent with household mobility with device  PATIENT GOALS: improve balance, improve ability to ambulate safely.   NEXT MD VISIT:   OBJECTIVE:  Note: Objective measures were completed at Evaluation unless otherwise noted.  DIAGNOSTIC FINDINGS:  DG Ribs Unilateral Right 09/19/2023  Narrative & Impression  CLINICAL DATA:  Post  fall, now with right-sided rib pain.   EXAM: RIGHT RIBS - 2 VIEW   COMPARISON:  Chest radiograph-09/18/2012   FINDINGS: Unchanged cardiac silhouette and mediastinal contours with atherosclerotic plaque within the thoracic aorta. No focal parenchymal opacities. No pleural effusion or pneumothorax. No evidence of edema.   No definite displaced right-sided rib fractures. Regional soft tissues appear normal. Surgical clips overlie the left breast.   IMPRESSION: 1. No definite displaced right-sided rib fractures. 2. No acute cardiopulmonary disease. 3. Aortic Atherosclerosis (ICD10-I70.0).     Electronically Signed   By: Robbi Childs M.D.   On: 09/21/2023 16:40      PATIENT SURVEYS:  ABC scale: 36.25 % (12/18/2023)  COGNITION: Overall cognitive status: Within functional limits for tasks assessed     SENSATION:   MUSCLE LENGTH:   POSTURE: forward flexed, decreased B knee extension  PALPATION:   LUMBAR ROM:   AROM eval  Flexion   Extension   Right lateral flexion   Left lateral flexion   Right rotation   Left rotation    (Blank rows = not tested)  LOWER EXTREMITY ROM:     Passive  Right eval Left eval  Hip flexion    Hip extension    Hip abduction    Hip adduction    Hip internal rotation    Hip external rotation    Knee flexion    Knee extension    Ankle dorsiflexion    Ankle plantarflexion    Ankle inversion    Ankle eversion     (Blank rows = not tested)  LOWER EXTREMITY MMT:    MMT Right eval Left eval  Hip flexion 4 4 at available range with L lateral thigh pulling   Hip extension (seated manually resisted) 4 3  Hip abduction (seated manually resisted) 4 4-  Hip adduction    Hip internal rotation    Hip external rotation    Knee flexion 5 4  Knee extension  5 5  Ankle dorsiflexion    Ankle plantarflexion    Ankle inversion    Ankle eversion     (Blank rows = not tested)  LUMBAR SPECIAL TESTS:    FUNCTIONAL TESTS:  DGI:  7/24  GAIT: Distance walked: 40 ft Assistive device utilized: Single point cane Level of assistance: CGA Comments: antalgic, SPC on R hand, decreased stance L LE, decreased B knee extension, decreased step length, shuffling gait pattern whenever starting back up to walk after static position.    Pt tendency to reach too early for chair to sit, causing her center of gravity to leave her base of support.  Cues needed to walk to the chair and not to reach for the chair too early.   Cues for B femoral control to sit with proper hand placement.   Ascending stairs: R rail assist, reciprocal pattern, decreased B femoral control  Descaneind stairs: R rail assist, pt steps down with L LE first. 2 feet to a step, decreased B femoral control .    TREATMENT DATE: 01/30/2024                                                                                                                                Gait Training  Performed to improve ability to ambulate with better foot clearance and step length.   NuStep seat 5, arms 5, level 1 for 5 minutes to promote reciprocal, alternating movement  Cues to maintain at least 70 SPM    Standing with B UE assist   Hip abduction    R 10x3   L 10x3  Standing static mini lunge with contralateral UE assist   R 10x2  L 10x2   Gait 100 ft with CGA and SPC cues for large steps  Then with sudden stops 100 ft, cues for large steps when initiating gait  Side step with SPC 30 ft to the L and 30 ft to the R 1x  Backwards gait with SPC with PT CGA 30 ft x 2   Cues for larger steps.   Stepping over 2 mini hurdles with R SPC assist and PT CGA to min A   10x. LOB 2x with PT min A to recover.    Improved exercise technique, movement at target joints, use of target muscles after mod verbal, visual, tactile cues.          PATIENT EDUCATION:  Education details: there-ex, HEP Person educated: Patient Education method: Explanation, Demonstration,  Tactile cues, Verbal cues, and Handouts Education comprehension: verbalized understanding, returned demonstration, verbal cues required, tactile cues required, and needs further education  HOME EXERCISE PROGRAM:  Seated hip flexion with opposite shoulder flexion 10x3 each LE to promote coodination and reciprocal movement  Picture provided  Gait training with rw/SPC with son, with emphasis on hip and knee flexion, increase base of support and step length.   Pt and son Cortland Ding) verbalized understanding   Access  Code: [email or web address] URL: [email or web address] Date: 12/23/2023 Prepared by: Suzzane Estes  Exercises - Sit to Stand with Counter Support  - 3 x daily - 7 x weekly - 1 sets - 5 reps - Seated Long Arc Quad  - 3 x daily - 7 x weekly - 3 sets - 10 reps - 10 seconds hold     ASSESSMENT:  CLINICAL IMPRESSION: Continued working on step length and foot clearance as well as obstacle negotiation to help decrease fall risk.  Cues needed to pick feet up and increase step length for better foot clearance. Cues to place her center of gravity over her foot when stepping over obstacles to improve balance.  Pt tolerated session well without aggravation of symptoms. Pt will benefit from continued skilled physical therapy services to improve strength, balance, function, and gait.     OBJECTIVE IMPAIRMENTS: Abnormal gait, decreased balance, difficulty walking, decreased strength, improper body mechanics, and postural dysfunction.   ACTIVITY LIMITATIONS: carrying, lifting, standing, squatting, stairs, and locomotion level  PARTICIPATION LIMITATIONS:   PERSONAL FACTORS: Age, Fitness, Past/current experiences, Time since onset of injury/illness/exacerbation, and 1-2 comorbidities: HTN, osteoporosis are also affecting patient's functional outcome.   REHAB POTENTIAL: Fair    CLINICAL DECISION MAKING: Stable/uncomplicated  EVALUATION COMPLEXITY: Low   GOALS: Goals reviewed with  patient? Yes  SHORT TERM GOALS: Target date: 01/02/2024  Pt will be independent with her initial HEP to improve strength, femoral control, balance, and function.  Baseline: Pt has not yet started her initial HEP (12/18/2023) Goal status: INITIAL   LONG TERM GOALS: Target date: 02/13/2024  Pt will improve her Activities-specific Balance Confidence (ABC) Scale by at least 20% as a demonstration of improved balance.  Baseline: ABC scale sore: 36.25% Goal status: INITIAL  2.  Pt will improve her DGI score to 15/24 or more as a demonstration of improved balance.  Baseline: DGI score: 7/24 (12/18/2023) Goal status: INITIAL  3.  Pt will improve her B hip flexion, extension and abduction strength by at least 1/2 MMT to improve ability to ambulate, perform standing tasks more safely and with less difficulty.   Baseline:  MMT Right eval Left eval  Hip flexion 4 4 at available range with L lateral thigh pulling   Hip extension (seated manually resisted) 4 3  Hip abduction (seated manually resisted) 4 4-   (12/18/2023)  Goal status: INITIAL    PLAN:  PT FREQUENCY: 1-2x/week  PT DURATION: 8 weeks  PLANNED INTERVENTIONS: 97110-Therapeutic exercises, 97530- Therapeutic activity, [id number]- Neuromuscular re-education, 97535- Self Care, [id number]- Manual therapy, [id number]- Gait training, [phone]- Canalith repositioning, U9811- Electrical stimulation (unattended), Patient/Family education, Balance training, Stair training, and Vestibular training.  PLAN FOR NEXT SESSION: placing and maintaining center of gravity over her base of support, gait training, trunk and LE strengthening, proper mechanics, manual techniques, modalities PRN.   Leigh Kaeding, PT, DPT 01/30/2024, 11:28 AM

## 2024-02-03 ENCOUNTER — Ambulatory Visit

## 2024-02-03 DIAGNOSIS — Z9181 History of falling: Secondary | ICD-10-CM

## 2024-02-03 DIAGNOSIS — M6281 Muscle weakness (generalized): Secondary | ICD-10-CM

## 2024-02-03 DIAGNOSIS — R262 Difficulty in walking, not elsewhere classified: Secondary | ICD-10-CM | POA: Diagnosis not present

## 2024-02-03 DIAGNOSIS — R2681 Unsteadiness on feet: Secondary | ICD-10-CM

## 2024-02-03 NOTE — Therapy (Signed)
 OUTPATIENT PHYSICAL THERAPY TREATMENT   Patient Name: Anna Johnson MRN: [id number] DOB:May 10, 1941, 83 y.o., female Today's Date: 02/03/2024  END OF SESSION:  PT End of Session - 02/03/24 1039     Visit Number 9    Number of Visits 17    Date for PT Re-Evaluation 02/13/24    PT Start Time 1034    PT Stop Time 1113    PT Time Calculation (min) 39 min    Equipment Utilized During Treatment Gait belt    Activity Tolerance Patient tolerated treatment well;No increased pain    Behavior During Therapy [facility] for tasks assessed/performed          Past Medical History:  Diagnosis Date   Allergy    Glaucoma    Hyperlipidemia    Hypertension    Osteoporosis    Pneumonia 03/29/2015   Past Surgical History:  Procedure Laterality Date   BREAST BIOPSY Left 03/16/2018   Affirm Biopsy- Ribbon clip- positive   BREAST LUMPECTOMY Left 04/2018   CATARACT EXTRACTION  2004   DILATION AND CURETTAGE OF UTERUS     HIP FRACTURE SURGERY  09/15/2010   left hip   WRIST FRACTURE SURGERY     ORIF   Patient Active Problem List   Diagnosis Date Noted   Chronic midline low back pain without sciatica 02/28/2021   Osteoarthritis of both shoulders 02/28/2021   Glaucoma of both eyes 04/06/2018   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 (HCC) 04/03/2018   Abnormal chest x-ray 05/08/2015   Bloodgood disease 05/08/2015   Thoracic, thoracolumbar and lumbosacral intervertebral disc disorder 05/08/2015   Osteoporosis 04/07/2015   Vitamin D  deficiency 03/29/2015   Open-angle glaucoma 03/29/2015   Hypertension 03/29/2015   Seasonal allergies 03/29/2015   Osteoarthritis 03/29/2015   Fibrocystic breast disease 03/29/2015   Compression fracture of L1 lumbar vertebra (HCC) 03/29/2015   History of hip fracture 03/29/2015   Hyperlipemia 02/28/2015    PCP: Gasper Nancyann BRAVO, MD   REFERRING PROVIDER: Gasper Nancyann BRAVO, MD   REFERRING DIAG: R26.9 (ICD-10-CM) - Gait  disturbance  Rationale for Evaluation and Treatment: Rehabilitation  THERAPY DIAG:  Difficulty in walking, not elsewhere classified  Unsteadiness on feet  Muscle weakness (generalized)  History of falling  ONSET DATE: September 17, 2023 (pt fell backwards reaching for bed post)  SUBJECTIVE:                                                                                                                                                                                           SUBJECTIVE STATEMENT: A little tired. Stayed up late last night  doing cleaning around the washing machine area.    PERTINENT HISTORY:  Gait disturbance. Pt got up in the middle of the night to go to the bathroom. Pt reached towards her bed but missed and feel onto her back, pt states she reached too early. Felt fine afterwards except 2 days with B LE discomfort. LE is now fine but L LE just feels awkward. Has a hx of L hip fx S/P surgery 15 years ago. Pt states not having pain or anything but feels like she needs exercise.   PAIN:  None  PRECAUTIONS: Fall  WEIGHT BEARING RESTRICTIONS: No  FALLS:  Has patient fallen in last 6 months? Yes. Number of falls 1 first week in September 16, 2023.   LIVING ENVIRONMENT: Lives with: lives with their spouse Lives in: House/apartment Stairs: Yes: Internal: 15 steps; on right going up and External: 5 steps; bilateral but cannot reach both Has following equipment at home: Single point cane, Walker - 4 wheeled, and shower chair  OCCUPATION: retired  PLOF: Independent with household mobility with device  PATIENT GOALS: improve balance, improve ability to ambulate safely.   NEXT MD VISIT:   OBJECTIVE:  Note: Objective measures were completed at Evaluation unless otherwise noted.  DIAGNOSTIC FINDINGS:  DG Ribs Unilateral Right 09/19/2023  Narrative & Impression  CLINICAL DATA:  Post fall, now with right-sided rib pain.   EXAM: RIGHT RIBS - 2 VIEW   COMPARISON:   Chest radiograph-09/18/2012   FINDINGS: Unchanged cardiac silhouette and mediastinal contours with atherosclerotic plaque within the thoracic aorta. No focal parenchymal opacities. No pleural effusion or pneumothorax. No evidence of edema.   No definite displaced right-sided rib fractures. Regional soft tissues appear normal. Surgical clips overlie the left breast.   IMPRESSION: 1. No definite displaced right-sided rib fractures. 2. No acute cardiopulmonary disease. 3. Aortic Atherosclerosis (ICD10-I70.0).    Electronically Signed   By: Norleen Roulette M.D.   On: 09/21/2023 16:40   PATIENT SURVEYS:  ABC scale: 36.25 % (12/18/2023)  COGNITION: Overall cognitive status: Within functional limits for tasks assessed     POSTURE: forward flexed, decreased B knee extension  LOWER EXTREMITY MMT:    MMT Right eval Left eval  Hip flexion 4 4 at available range with L lateral thigh pulling   Hip extension (seated manually resisted) 4 3  Hip abduction (seated manually resisted) 4 4-  Hip adduction    Hip internal rotation    Hip external rotation    Knee flexion 5 4  Knee extension 5 5  Ankle dorsiflexion    Ankle plantarflexion    Ankle inversion    Ankle eversion     (Blank rows = not tested)  FUNCTIONAL TESTS:  DGI: 7/24  GAIT: Distance walked: 40 ft Assistive device utilized: Single point cane Level of assistance: CGA Comments: antalgic, SPC on R hand, decreased stance L LE, decreased B knee extension, decreased step length, shuffling gait pattern whenever starting back up to walk after static position.   Pt tendency to reach too early for chair to sit, causing her center of gravity to leave her base of support.  Cues needed to walk to the chair and not to reach for the chair too early.   Cues for B femoral control to sit with proper hand placement.   Ascending stairs: R rail assist, reciprocal pattern, decreased B femoral control  Descending stairs: R rail assist, pt  steps down with L LE first. 2 feet to a step, decreased  B femoral control .    TREATMENT DATE 02/03/24 :                                                                                                                               -AA/ROM and mobility tolerance stimulation NUSTEP x6 minutes level 1, seat 5, arms 5 (up 1 minutes today)  *may benefit from seat farther back as she has insufficient Left ankle DF for this degree of knee flexion; also her LLE valgus tendency places medial knee in close proximity to lever lock.  -10x STS from bluechair, hands on knee (moderate effort, requires cues to increase forward lean due to impaired balance in sagittal plane)  -269ft overground AMB gait training, SPC RUE, intermittent festination when multitasking or turning corners, pt cued to take bigger steps which helps decrease festination; SPC is not an appropriate device to meet instability needs, often being carried or maintained in air at peak points of festination.  -10xSTS from blue chair, limited cues carryover prom prior set, heavy cues to perform (strong dominance of knee extensors over weaker hip extensors)  -269ft overground training c SPC, cues as above  -8 minutes of ADL walking task with SPC RUE, transfering cones from dumbbell rack to treadmill whilst crossing the red/yelloe/red/yellow tumble mat and SPC on flooro stepover: gait belt with minGuard assit; 1 signififcant LOB with delayed recovery.     PATIENT EDUCATION:  Education details: there-ex, HEP Person educated: Patient Education method: Explanation, Demonstration, Tactile cues, Verbal cues, and Handouts Education comprehension: verbalized understanding, returned demonstration, verbal cues required, tactile cues required, and needs further education  HOME EXERCISE PROGRAM: Seated hip flexion with opposite shoulder flexion 10x3 each LE to promote coodination and reciprocal movement  Picture provided Gait training with rw/SPC with son,  with emphasis on hip and knee flexion, increase base of support and step length.   Pt and son Salbador) verbalized understanding Access Code: [email or web address] URL: [email or web address] Date: 12/23/2023 Prepared by: Emil Glassman  Exercises - Sit to Stand with Counter Support  - 3 x daily - 7 x weekly - 1 sets - 5 reps - Seated Long Arc Quad  - 3 x daily - 7 x weekly - 3 sets - 10 reps - 10 seconds hold  ASSESSMENT: CLINICAL IMPRESSION: Continued working on step length and foot clearance as well as obstacle negotiation to help decrease fall risk. Cues needed to pick feet up and increase step length for better foot clearance. Cues limited by memory limitations. Pt would be more safe using a 2-hand device, doe snot have UE strnegth or reaction time to arrest a LOB with SPC. Pt tolerated session well without aggravation of symptoms. Pt will benefit from continued skilled physical therapy services to improve strength, balance, function, and gait.   OBJECTIVE IMPAIRMENTS: Abnormal gait, decreased balance, difficulty walking, decreased strength, improper body mechanics, and postural dysfunction.   ACTIVITY LIMITATIONS: carrying, lifting, standing, squatting, stairs, and locomotion level  PARTICIPATION LIMITATIONS:   PERSONAL FACTORS: Age, Fitness, Past/current experiences, Time since onset of injury/illness/exacerbation, and 1-2 comorbidities: HTN, osteoporosis are also affecting patient's functional outcome.   REHAB POTENTIAL: Fair    CLINICAL DECISION MAKING: Stable/uncomplicated  EVALUATION COMPLEXITY: Low   GOALS: Goals reviewed with patient? Yes  SHORT TERM GOALS: Target date: 01/02/2024  Pt will be independent with her initial HEP to improve strength, femoral control, balance, and function.  Baseline: Pt has not yet started her initial HEP (12/18/2023) Goal status: INITIAL   LONG TERM GOALS: Target date: 02/13/2024  Pt will improve her Activities-specific Balance Confidence  (ABC) Scale by at least 20% as a demonstration of improved balance.  Baseline: ABC scale sore: 36.25% Goal status: INITIAL  2.  Pt will improve her DGI score to 15/24 or more as a demonstration of improved balance.  Baseline: DGI score: 7/24 (12/18/2023) Goal status: INITIAL  3.  Pt will improve her B hip flexion, extension and abduction strength by at least 1/2 MMT to improve ability to ambulate, perform standing tasks more safely and with less difficulty.   Baseline:  MMT Right eval Left eval  Hip flexion 4 4 at available range with L lateral thigh pulling   Hip extension (seated manually resisted) 4 3  Hip abduction (seated manually resisted) 4 4-   (12/18/2023)  Goal status: INITIAL  PLAN:  PT FREQUENCY: 1-2x/week  PT DURATION: 8 weeks  PLANNED INTERVENTIONS: 97110-Therapeutic exercises, 97530- Therapeutic activity, [id number]- Neuromuscular re-education, 97535- Self Care, [id number]- Manual therapy, [id number]- Gait training, [phone]- Canalith repositioning, [id number]- Electrical stimulation (unattended), Patient/Family education, Balance training, Stair training, and Vestibular training.  PLAN FOR NEXT SESSION: placing and maintaining center of gravity over her base of support, gait training, trunk and LE strengthening, proper mechanics, manual techniques, modalities PRN.   Deondra Wigger C, PT, DPT 02/03/2024, 10:41 AM  10:42 AM, 02/03/24 Peggye JAYSON Linear, PT, DPT Physical Therapist - [place] [phone] (Office)

## 2024-02-06 ENCOUNTER — Ambulatory Visit

## 2024-02-06 DIAGNOSIS — Z9181 History of falling: Secondary | ICD-10-CM

## 2024-02-06 DIAGNOSIS — M6281 Muscle weakness (generalized): Secondary | ICD-10-CM

## 2024-02-06 DIAGNOSIS — R262 Difficulty in walking, not elsewhere classified: Secondary | ICD-10-CM | POA: Diagnosis not present

## 2024-02-06 DIAGNOSIS — R2681 Unsteadiness on feet: Secondary | ICD-10-CM

## 2024-02-06 NOTE — Therapy (Signed)
 OUTPATIENT PHYSICAL THERAPY TREATMENT And Progress Report (12/18/2023 - 02/06/2024)   Patient Name: Anna Johnson MRN: [id number] DOB:1940/09/26, 83 y.o., female Today's Date: 02/06/2024  END OF SESSION:  PT End of Session - 02/06/24 1035     Visit Number 10    Number of Visits 17    Date for PT Re-Evaluation 02/20/24    PT Start Time 1035    PT Stop Time 1114    PT Time Calculation (min) 39 min    Equipment Utilized During Treatment Gait belt    Activity Tolerance Patient tolerated treatment well;No increased pain    Behavior During Therapy [facility] for tasks assessed/performed                  Past Medical History:  Diagnosis Date   Allergy    Glaucoma    Hyperlipidemia    Hypertension    Osteoporosis    Pneumonia 03/29/2015   Past Surgical History:  Procedure Laterality Date   BREAST BIOPSY Left 03/16/2018   Affirm Biopsy- Ribbon clip- positive   BREAST LUMPECTOMY Left 04/2018   CATARACT EXTRACTION  2004   DILATION AND CURETTAGE OF UTERUS     HIP FRACTURE SURGERY  09/15/2010   left hip   WRIST FRACTURE SURGERY     ORIF   Patient Active Problem List   Diagnosis Date Noted   Chronic midline low back pain without sciatica 02/28/2021   Osteoarthritis of both shoulders 02/28/2021   Glaucoma of both eyes 04/06/2018   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 (HCC) 04/03/2018   Abnormal chest x-ray 05/08/2015   Bloodgood disease 05/08/2015   Thoracic, thoracolumbar and lumbosacral intervertebral disc disorder 05/08/2015   Osteoporosis 04/07/2015   Vitamin D  deficiency 03/29/2015   Open-angle glaucoma 03/29/2015   Hypertension 03/29/2015   Seasonal allergies 03/29/2015   Osteoarthritis 03/29/2015   Fibrocystic breast disease 03/29/2015   Compression fracture of L1 lumbar vertebra (HCC) 03/29/2015   History of hip fracture 03/29/2015   Hyperlipemia 02/28/2015    PCP: Gasper Nancyann BRAVO, MD   REFERRING PROVIDER: Gasper Nancyann BRAVO,  MD   REFERRING DIAG: R26.9 (ICD-10-CM) - Gait disturbance  Rationale for Evaluation and Treatment: Rehabilitation  THERAPY DIAG:  Unsteadiness on feet - Plan: PT plan of care cert/re-cert  Difficulty in walking, not elsewhere classified - Plan: PT plan of care cert/re-cert  Muscle weakness (generalized) - Plan: PT plan of care cert/re-cert  History of falling - Plan: PT plan of care cert/re-cert  ONSET DATE: September 17, 2023 (pt fell backwards reaching for bed post)  SUBJECTIVE:  SUBJECTIVE STATEMENT: Doing ok. Thinks the walking is pretty good.      PERTINENT HISTORY:  Gait disturbance. Pt got up in the middle of the night to go to the bathroom. Pt reached towards her bed but missed and feel onto her back, pt states she reached too early. Felt fine afterwards except 2 days with B LE discomfort. LE is now fine but L LE just feels awkward. Has a hx of L hip fx S/P surgery 15 years ago. Pt states not having pain or anything but feels like she needs exercise.    Blood pressure is controlled per pt. No latex allergies     PAIN:  None   PRECAUTIONS: Fall  RED FLAGS: No known red flags   WEIGHT BEARING RESTRICTIONS: No  FALLS:  Has patient fallen in last 6 months? Yes. Number of falls 1 first week in September 16, 2023.   LIVING ENVIRONMENT: Lives with: lives with their spouse Lives in: House/apartment Stairs: Yes: Internal: 15 steps; on right going up and External: 5 steps; bilateral but cannot reach both Has following equipment at home: Single point cane, Walker - 4 wheeled, and shower chair  OCCUPATION: retired  PLOF: Independent with household mobility with device  PATIENT GOALS: improve balance, improve ability to ambulate safely.   NEXT MD VISIT:   OBJECTIVE:  Note:  Objective measures were completed at Evaluation unless otherwise noted.  DIAGNOSTIC FINDINGS:  DG Ribs Unilateral Right 09/19/2023  Narrative & Impression  CLINICAL DATA:  Post fall, now with right-sided rib pain.   EXAM: RIGHT RIBS - 2 VIEW   COMPARISON:  Chest radiograph-09/18/2012   FINDINGS: Unchanged cardiac silhouette and mediastinal contours with atherosclerotic plaque within the thoracic aorta. No focal parenchymal opacities. No pleural effusion or pneumothorax. No evidence of edema.   No definite displaced right-sided rib fractures. Regional soft tissues appear normal. Surgical clips overlie the left breast.   IMPRESSION: 1. No definite displaced right-sided rib fractures. 2. No acute cardiopulmonary disease. 3. Aortic Atherosclerosis (ICD10-I70.0).     Electronically Signed   By: Norleen Roulette M.D.   On: 09/21/2023 16:40      PATIENT SURVEYS:  ABC scale: 36.25 % (12/18/2023)  COGNITION: Overall cognitive status: Within functional limits for tasks assessed     SENSATION:   MUSCLE LENGTH:   POSTURE: forward flexed, decreased B knee extension  PALPATION:   LUMBAR ROM:   AROM eval  Flexion   Extension   Right lateral flexion   Left lateral flexion   Right rotation   Left rotation    (Blank rows = not tested)  LOWER EXTREMITY ROM:     Passive  Right eval Left eval  Hip flexion    Hip extension    Hip abduction    Hip adduction    Hip internal rotation    Hip external rotation    Knee flexion    Knee extension    Ankle dorsiflexion    Ankle plantarflexion    Ankle inversion    Ankle eversion     (Blank rows = not tested)  LOWER EXTREMITY MMT:    MMT Right eval Left eval  Hip flexion 4 4 at available range with L lateral thigh pulling   Hip extension (seated manually resisted) 4 3  Hip abduction (seated manually resisted) 4 4-  Hip adduction    Hip internal rotation    Hip external rotation    Knee flexion 5 4  Knee extension  5 5  Ankle dorsiflexion    Ankle plantarflexion    Ankle inversion    Ankle eversion     (Blank rows = not tested)  LUMBAR SPECIAL TESTS:    FUNCTIONAL TESTS:  DGI: 7/24  GAIT: Distance walked: 40 ft Assistive device utilized: Single point cane Level of assistance: CGA Comments: antalgic, SPC on R hand, decreased stance L LE, decreased B knee extension, decreased step length, shuffling gait pattern whenever starting back up to walk after static position.    Pt tendency to reach too early for chair to sit, causing her center of gravity to leave her base of support.  Cues needed to walk to the chair and not to reach for the chair too early.   Cues for B femoral control to sit with proper hand placement.   Ascending stairs: R rail assist, reciprocal pattern, decreased B femoral control  Descaneind stairs: R rail assist, pt steps down with L LE first. 2 feet to a step, decreased B femoral control .    TREATMENT DATE: 02/06/2024                                                                                                                                Gait Training  Performed to improve ability to ambulate with better foot clearance and step length.   NuStep seat 5, arms 5, level 1 for 5 minutes to promote reciprocal, alternating movement  Cues to maintain at least 70 SPM   Directed patient with gait with normal gait speed, with changes in speed, 180 degree pivot turn, with R and L cervical rotation position, with cervical flexion and extension position, stepping around obstacles, stepping over an obstacle, ascending and descending 4 regular steps with UE assist   With use of SPC as needed 3   Seated manually resisted hip flexion, abduction, extension 1x each way for each LE  Reviewed progress/current status with PT towards goals.   Gait with SPC with R and L head turns, cues for increasing step length 30 ft x 6  Gait 20 ft with SPC with cues for increasing step  length   Improved exercise technique, movement at target joints, use of target muscles after mod verbal, visual, tactile cues.          PATIENT EDUCATION:  Education details: there-ex, HEP Person educated: Patient Education method: Explanation, Demonstration, Tactile cues, Verbal cues, and Handouts Education comprehension: verbalized understanding, returned demonstration, verbal cues required, tactile cues required, and needs further education  HOME EXERCISE PROGRAM:  Seated hip flexion with opposite shoulder flexion 10x3 each LE to promote coodination and reciprocal movement  Picture provided  Gait training with rw/SPC with son, with emphasis on hip and knee flexion, increase base of support and step length.   Pt and son Salbador) verbalized understanding   Access Code: [email or web address] URL: [email or web address] Date: 12/23/2023 Prepared by: Emil Glassman  Exercises - Sit to Stand with Counter Support  -  3 x daily - 7 x weekly - 1 sets - 5 reps - Seated Long Arc Quad  - 3 x daily - 7 x weekly - 3 sets - 10 reps - 10 seconds hold     ASSESSMENT:  CLINICAL IMPRESSION:  Pt demonstrates improved overall B hip strength since initial evaluation. Similar DGI balance score in which pt tendency for shuffling gait causes her do become unsteady during the balance test. Main challenge in balance is the shuffling gait pattern. Improved ability to ambulate with more steadiness observed when cued to increase step length and foot clearance. Challenges to progress include age.  Pt tolerated session well without aggravation of symptoms. Pt will benefit from continued skilled physical therapy services to improve strength, balance, function, and gait.     OBJECTIVE IMPAIRMENTS: Abnormal gait, decreased balance, difficulty walking, decreased strength, improper body mechanics, and postural dysfunction.   ACTIVITY LIMITATIONS: carrying, lifting, standing, squatting, stairs, and  locomotion level  PARTICIPATION LIMITATIONS:   PERSONAL FACTORS: Age, Fitness, Past/current experiences, Time since onset of injury/illness/exacerbation, and 1-2 comorbidities: HTN, osteoporosis are also affecting patient's functional outcome.   REHAB POTENTIAL: Fair    CLINICAL DECISION MAKING: Stable/uncomplicated  EVALUATION COMPLEXITY: Low   GOALS: Goals reviewed with patient? Yes  SHORT TERM GOALS: Target date: 01/02/2024  Pt will be independent with her initial HEP to improve strength, femoral control, balance, and function.  Baseline: Pt has not yet started her initial HEP (12/18/2023); Doing her HEP, no questions (02/06/2024) Goal status: MET   LONG TERM GOALS: Target date: 02/20/2024  Pt will improve her Activities-specific Balance Confidence (ABC) Scale by at least 20% as a demonstration of improved balance.  Baseline: ABC scale sore: 36.25% Goal status: ONGOING  2.  Pt will improve her DGI score to 15/24 or more as a demonstration of improved balance.  Baseline: DGI score: 7/24 (12/18/2023); 6/24 (02/06/2024) Goal status: ONGOING  3.  Pt will improve her B hip flexion, extension and abduction strength by at least 1/2 MMT to improve ability to ambulate, perform standing tasks more safely and with less difficulty.   Baseline:  MMT Right eval Left eval R (02/06/2024) L (02/06/2024)  Hip flexion 4 4 at available range with L lateral thigh pulling  4+ 4+  Hip extension (seated manually resisted) 4 3 4 4   Hip abduction (seated manually resisted) 4 4- 4+ 4   (12/18/2023)  Goal status: PROGRESSING    PLAN:  PT FREQUENCY: 1-2x/week  PT DURATION: 2 weeks  PLANNED INTERVENTIONS: 97110-Therapeutic exercises, 97530- Therapeutic activity, [id number]- Neuromuscular re-education, 97535- Self Care, [id number]- Manual therapy, [id number]- Gait training, [phone]- Canalith repositioning, [id number]- Electrical stimulation (unattended), Patient/Family education, Balance training, Stair training, and  Vestibular training.  PLAN FOR NEXT SESSION: placing and maintaining center of gravity over her base of support, gait training, trunk and LE strengthening, proper mechanics, manual techniques, modalities PRN.  Thank you for your referral.  Thuy Atilano, PT, DPT 02/06/2024, 11:30 AM

## 2024-02-10 ENCOUNTER — Encounter (INDEPENDENT_AMBULATORY_CARE_PROVIDER_SITE_OTHER): Admitting: Ophthalmology

## 2024-02-10 ENCOUNTER — Ambulatory Visit: Attending: Family Medicine

## 2024-02-10 DIAGNOSIS — R2681 Unsteadiness on feet: Secondary | ICD-10-CM | POA: Diagnosis present

## 2024-02-10 DIAGNOSIS — I10 Essential (primary) hypertension: Secondary | ICD-10-CM

## 2024-02-10 DIAGNOSIS — H33302 Unspecified retinal break, left eye: Secondary | ICD-10-CM

## 2024-02-10 DIAGNOSIS — Z9181 History of falling: Secondary | ICD-10-CM | POA: Insufficient documentation

## 2024-02-10 DIAGNOSIS — R262 Difficulty in walking, not elsewhere classified: Secondary | ICD-10-CM | POA: Diagnosis present

## 2024-02-10 DIAGNOSIS — H43813 Vitreous degeneration, bilateral: Secondary | ICD-10-CM

## 2024-02-10 DIAGNOSIS — H353132 Nonexudative age-related macular degeneration, bilateral, intermediate dry stage: Secondary | ICD-10-CM

## 2024-02-10 DIAGNOSIS — M6281 Muscle weakness (generalized): Secondary | ICD-10-CM | POA: Insufficient documentation

## 2024-02-10 DIAGNOSIS — H35033 Hypertensive retinopathy, bilateral: Secondary | ICD-10-CM | POA: Diagnosis not present

## 2024-02-10 NOTE — Therapy (Signed)
 OUTPATIENT PHYSICAL THERAPY TREATMENT    Patient Name: Anna Johnson MRN: [id number] DOB:1940-11-20, 83 y.o., female Today's Date: 02/10/2024  END OF SESSION:  PT End of Session - 02/10/24 1033     Visit Number 11    Number of Visits 17    Date for PT Re-Evaluation 02/20/24    PT Start Time 1033    PT Stop Time 1112    PT Time Calculation (min) 39 min    Equipment Utilized During Treatment Gait belt    Activity Tolerance Patient tolerated treatment well    Behavior During Therapy [facility] for tasks assessed/performed                   Past Medical History:  Diagnosis Date   Allergy    Glaucoma    Hyperlipidemia    Hypertension    Osteoporosis    Pneumonia 03/29/2015   Past Surgical History:  Procedure Laterality Date   BREAST BIOPSY Left 03/16/2018   Affirm Biopsy- Ribbon clip- positive   BREAST LUMPECTOMY Left 04/2018   CATARACT EXTRACTION  2004   DILATION AND CURETTAGE OF UTERUS     HIP FRACTURE SURGERY  09/15/2010   left hip   WRIST FRACTURE SURGERY     ORIF   Patient Active Problem List   Diagnosis Date Noted   Chronic midline low back pain without sciatica 02/28/2021   Osteoarthritis of both shoulders 02/28/2021   Glaucoma of both eyes 04/06/2018   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 (HCC) 04/03/2018   Abnormal chest x-ray 05/08/2015   Bloodgood disease 05/08/2015   Thoracic, thoracolumbar and lumbosacral intervertebral disc disorder 05/08/2015   Osteoporosis 04/07/2015   Vitamin D  deficiency 03/29/2015   Open-angle glaucoma 03/29/2015   Hypertension 03/29/2015   Seasonal allergies 03/29/2015   Osteoarthritis 03/29/2015   Fibrocystic breast disease 03/29/2015   Compression fracture of L1 lumbar vertebra (HCC) 03/29/2015   History of hip fracture 03/29/2015   Hyperlipemia 02/28/2015    PCP: Gasper Nancyann BRAVO, MD   REFERRING PROVIDER: Gasper Nancyann BRAVO, MD   REFERRING DIAG: R26.9 (ICD-10-CM) - Gait  disturbance  Rationale for Evaluation and Treatment: Rehabilitation  THERAPY DIAG:  Unsteadiness on feet  Difficulty in walking, not elsewhere classified  Muscle weakness (generalized)  History of falling  ONSET DATE: September 17, 2023 (pt fell backwards reaching for bed post)  SUBJECTIVE:                                                                                                                                                                                           SUBJECTIVE STATEMENT:  Feeling a little tired today. Going to [facility] for her glaucoma after today's session around 1 pm.       PERTINENT HISTORY:  Gait disturbance. Pt got up in the middle of the night to go to the bathroom. Pt reached towards her bed but missed and feel onto her back, pt states she reached too early. Felt fine afterwards except 2 days with B LE discomfort. LE is now fine but L LE just feels awkward. Has a hx of L hip fx S/P surgery 15 years ago. Pt states not having pain or anything but feels like she needs exercise.    Blood pressure is controlled per pt. No latex allergies     PAIN:  None   PRECAUTIONS: Fall  RED FLAGS: No known red flags   WEIGHT BEARING RESTRICTIONS: No  FALLS:  Has patient fallen in last 6 months? Yes. Number of falls 1 first week in September 16, 2023.   LIVING ENVIRONMENT: Lives with: lives with their spouse Lives in: House/apartment Stairs: Yes: Internal: 15 steps; on right going up and External: 5 steps; bilateral but cannot reach both Has following equipment at home: Single point cane, Walker - 4 wheeled, and shower chair  OCCUPATION: retired  PLOF: Independent with household mobility with device  PATIENT GOALS: improve balance, improve ability to ambulate safely.   NEXT MD VISIT:   OBJECTIVE:  Note: Objective measures were completed at Evaluation unless otherwise noted.  DIAGNOSTIC FINDINGS:  DG Ribs Unilateral Right 09/19/2023  Narrative  & Impression  CLINICAL DATA:  Post fall, now with right-sided rib pain.   EXAM: RIGHT RIBS - 2 VIEW   COMPARISON:  Chest radiograph-09/18/2012   FINDINGS: Unchanged cardiac silhouette and mediastinal contours with atherosclerotic plaque within the thoracic aorta. No focal parenchymal opacities. No pleural effusion or pneumothorax. No evidence of edema.   No definite displaced right-sided rib fractures. Regional soft tissues appear normal. Surgical clips overlie the left breast.   IMPRESSION: 1. No definite displaced right-sided rib fractures. 2. No acute cardiopulmonary disease. 3. Aortic Atherosclerosis (ICD10-I70.0).     Electronically Signed   By: Norleen Roulette M.D.   On: 09/21/2023 16:40      PATIENT SURVEYS:  ABC scale: 36.25 % (12/18/2023)  COGNITION: Overall cognitive status: Within functional limits for tasks assessed     SENSATION:   MUSCLE LENGTH:   POSTURE: forward flexed, decreased B knee extension  PALPATION:   LUMBAR ROM:   AROM eval  Flexion   Extension   Right lateral flexion   Left lateral flexion   Right rotation   Left rotation    (Blank rows = not tested)  LOWER EXTREMITY ROM:     Passive  Right eval Left eval  Hip flexion    Hip extension    Hip abduction    Hip adduction    Hip internal rotation    Hip external rotation    Knee flexion    Knee extension    Ankle dorsiflexion    Ankle plantarflexion    Ankle inversion    Ankle eversion     (Blank rows = not tested)  LOWER EXTREMITY MMT:    MMT Right eval Left eval  Hip flexion 4 4 at available range with L lateral thigh pulling   Hip extension (seated manually resisted) 4 3  Hip abduction (seated manually resisted) 4 4-  Hip adduction    Hip internal rotation    Hip external rotation    Knee flexion  5 4  Knee extension 5 5  Ankle dorsiflexion    Ankle plantarflexion    Ankle inversion    Ankle eversion     (Blank rows = not tested)  LUMBAR SPECIAL TESTS:     FUNCTIONAL TESTS:  DGI: 7/24  GAIT: Distance walked: 40 ft Assistive device utilized: Single point cane Level of assistance: CGA Comments: antalgic, SPC on R hand, decreased stance L LE, decreased B knee extension, decreased step length, shuffling gait pattern whenever starting back up to walk after static position.    Pt tendency to reach too early for chair to sit, causing her center of gravity to leave her base of support.  Cues needed to walk to the chair and not to reach for the chair too early.   Cues for B femoral control to sit with proper hand placement.   Ascending stairs: R rail assist, reciprocal pattern, decreased B femoral control  Descaneind stairs: R rail assist, pt steps down with L LE first. 2 feet to a step, decreased B femoral control .    TREATMENT DATE: 02/10/2024                                                                                                                                Gait Training  Performed to improve ability to ambulate with better foot clearance and step length.   Gait with SPC and CGA 150 ft, cues for increasing step length, especially with L LE.  NuStep seat 5, arms 5, level 1 for 5 minutes to promote reciprocal, alternating movement  Cues to maintain at least 70 SPM  Gait with SPC with R and L head turns, cues for increasing step length and foot clearance 30 ft x 3   Stepping over 4 mini hurdles with SPC on R 6x2  Cues for increasing hip and knee flexion for foot clearance as well as step length.   Gait with vertical head turns 30 ft x 4  Cues for increasing step length and foot clearance  Ascending and descending stairs, emphasis on reciprocal gait pattern. 2x   Therapeutic rest breaks secondary to fatigue. Improved exercise technique, movement at target joints, use of target muscles after mod verbal, visual, tactile cues.          PATIENT EDUCATION:  Education details: there-ex, HEP Person educated:  Patient Education method: Explanation, Demonstration, Tactile cues, Verbal cues, and Handouts Education comprehension: verbalized understanding, returned demonstration, verbal cues required, tactile cues required, and needs further education  HOME EXERCISE PROGRAM:  Seated hip flexion with opposite shoulder flexion 10x3 each LE to promote coodination and reciprocal movement  Picture provided  Gait training with rw/SPC with son, with emphasis on hip and knee flexion, increase base of support and step length.   Pt and son Salbador) verbalized understanding   Access Code: [email or web address] URL: [email or web address] Date: 12/23/2023 Prepared by: Emil Glassman  Exercises - Sit to Stand with Counter Support  -  3 x daily - 7 x weekly - 1 sets - 5 reps - Seated Long Arc Quad  - 3 x daily - 7 x weekly - 3 sets - 10 reps - 10 seconds hold     ASSESSMENT:  CLINICAL IMPRESSION:  Continued working on improving hip and knee flexion and foot clearance to promote ability to clear obstacles and decrease fall risk. Difficulty performing, needing verbal cues to perform. Challenges to progress include age.  Pt tolerated session well without aggravation of symptoms. Pt will benefit from continued skilled physical therapy services to improve strength, balance, function, and gait.     OBJECTIVE IMPAIRMENTS: Abnormal gait, decreased balance, difficulty walking, decreased strength, improper body mechanics, and postural dysfunction.   ACTIVITY LIMITATIONS: carrying, lifting, standing, squatting, stairs, and locomotion level  PARTICIPATION LIMITATIONS:   PERSONAL FACTORS: Age, Fitness, Past/current experiences, Time since onset of injury/illness/exacerbation, and 1-2 comorbidities: HTN, osteoporosis are also affecting patient's functional outcome.   REHAB POTENTIAL: Fair    CLINICAL DECISION MAKING: Stable/uncomplicated  EVALUATION COMPLEXITY: Low   GOALS: Goals reviewed with patient?  Yes  SHORT TERM GOALS: Target date: 01/02/2024  Pt will be independent with her initial HEP to improve strength, femoral control, balance, and function.  Baseline: Pt has not yet started her initial HEP (12/18/2023); Doing her HEP, no questions (02/06/2024) Goal status: MET   LONG TERM GOALS: Target date: 02/20/2024  Pt will improve her Activities-specific Balance Confidence (ABC) Scale by at least 20% as a demonstration of improved balance.  Baseline: ABC scale sore: 36.25% Goal status: ONGOING  2.  Pt will improve her DGI score to 15/24 or more as a demonstration of improved balance.  Baseline: DGI score: 7/24 (12/18/2023); 6/24 (02/06/2024) Goal status: ONGOING  3.  Pt will improve her B hip flexion, extension and abduction strength by at least 1/2 MMT to improve ability to ambulate, perform standing tasks more safely and with less difficulty.   Baseline:  MMT Right eval Left eval R (02/06/2024) L (02/06/2024)  Hip flexion 4 4 at available range with L lateral thigh pulling  4+ 4+  Hip extension (seated manually resisted) 4 3 4 4   Hip abduction (seated manually resisted) 4 4- 4+ 4   (12/18/2023)  Goal status: PROGRESSING    PLAN:  PT FREQUENCY: 1-2x/week  PT DURATION: 2 weeks  PLANNED INTERVENTIONS: 97110-Therapeutic exercises, 97530- Therapeutic activity, [id number]- Neuromuscular re-education, 97535- Self Care, 02859- Manual therapy, [id number]- Gait training, [phone]- Canalith repositioning, [id number]- Electrical stimulation (unattended), Patient/Family education, Balance training, Stair training, and Vestibular training.  PLAN FOR NEXT SESSION: placing and maintaining center of gravity over her base of support, gait training, trunk and LE strengthening, proper mechanics, manual techniques, modalities PRN.    Gilad Dugger, PT, DPT 02/10/2024, 12:42 PM

## 2024-02-12 ENCOUNTER — Ambulatory Visit

## 2024-02-16 ENCOUNTER — Ambulatory Visit

## 2024-02-16 DIAGNOSIS — R262 Difficulty in walking, not elsewhere classified: Secondary | ICD-10-CM

## 2024-02-16 DIAGNOSIS — R2681 Unsteadiness on feet: Secondary | ICD-10-CM

## 2024-02-16 DIAGNOSIS — Z9181 History of falling: Secondary | ICD-10-CM

## 2024-02-16 DIAGNOSIS — M6281 Muscle weakness (generalized): Secondary | ICD-10-CM

## 2024-02-16 NOTE — Therapy (Signed)
 OUTPATIENT PHYSICAL THERAPY TREATMENT    Patient Name: Anna Johnson MRN: [id number] DOB:03/10/1941, 83 y.o., female Today's Date: 02/16/2024  END OF SESSION:  PT End of Session - 02/16/24 1117     Visit Number 12    Number of Visits 17    Date for PT Re-Evaluation 02/20/24    PT Start Time 1117    PT Stop Time 1158    PT Time Calculation (min) 41 min    Equipment Utilized During Treatment Gait belt    Activity Tolerance Patient tolerated treatment well    Behavior During Therapy [facility] for tasks assessed/performed                    Past Medical History:  Diagnosis Date   Allergy    Glaucoma    Hyperlipidemia    Hypertension    Osteoporosis    Pneumonia 03/29/2015   Past Surgical History:  Procedure Laterality Date   BREAST BIOPSY Left 03/16/2018   Affirm Biopsy- Ribbon clip- positive   BREAST LUMPECTOMY Left 04/2018   CATARACT EXTRACTION  2004   DILATION AND CURETTAGE OF UTERUS     HIP FRACTURE SURGERY  09/15/2010   left hip   WRIST FRACTURE SURGERY     ORIF   Patient Active Problem List   Diagnosis Date Noted   Chronic midline low back pain without sciatica 02/28/2021   Osteoarthritis of both shoulders 02/28/2021   Glaucoma of both eyes 04/06/2018   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 (HCC) 04/03/2018   Abnormal chest x-ray 05/08/2015   Bloodgood disease 05/08/2015   Thoracic, thoracolumbar and lumbosacral intervertebral disc disorder 05/08/2015   Osteoporosis 04/07/2015   Vitamin D  deficiency 03/29/2015   Open-angle glaucoma 03/29/2015   Hypertension 03/29/2015   Seasonal allergies 03/29/2015   Osteoarthritis 03/29/2015   Fibrocystic breast disease 03/29/2015   Compression fracture of L1 lumbar vertebra (HCC) 03/29/2015   History of hip fracture 03/29/2015   Hyperlipemia 02/28/2015    PCP: Gasper Nancyann BRAVO, MD   REFERRING PROVIDER: Gasper Nancyann BRAVO, MD   REFERRING DIAG: R26.9 (ICD-10-CM) - Gait  disturbance  Rationale for Evaluation and Treatment: Rehabilitation  THERAPY DIAG:  Unsteadiness on feet  Difficulty in walking, not elsewhere classified  Muscle weakness (generalized)  History of falling  ONSET DATE: September 17, 2023 (pt fell backwards reaching for bed post)  SUBJECTIVE:                                                                                                                                                                                           SUBJECTIVE  STATEMENT: Feeling a little tired today. Going to [facility] for her glaucoma after today's session around 1 pm.       PERTINENT HISTORY:  Gait disturbance. Pt got up in the middle of the night to go to the bathroom. Pt reached towards her bed but missed and feel onto her back, pt states she reached too early. Felt fine afterwards except 2 days with B LE discomfort. LE is now fine but L LE just feels awkward. Has a hx of L hip fx S/P surgery 15 years ago. Pt states not having pain or anything but feels like she needs exercise.    Blood pressure is controlled per pt. No latex allergies     PAIN:  None   PRECAUTIONS: Fall  RED FLAGS: No known red flags   WEIGHT BEARING RESTRICTIONS: No  FALLS:  Has patient fallen in last 6 months? Yes. Number of falls 1 first week in September 16, 2023.   LIVING ENVIRONMENT: Lives with: lives with their spouse Lives in: House/apartment Stairs: Yes: Internal: 15 steps; on right going up and External: 5 steps; bilateral but cannot reach both Has following equipment at home: Single point cane, Walker - 4 wheeled, and shower chair  OCCUPATION: retired  PLOF: Independent with household mobility with device  PATIENT GOALS: improve balance, improve ability to ambulate safely.   NEXT MD VISIT:   OBJECTIVE:  Note: Objective measures were completed at Evaluation unless otherwise noted.  DIAGNOSTIC FINDINGS:  DG Ribs Unilateral Right 09/19/2023  Narrative  & Impression  CLINICAL DATA:  Post fall, now with right-sided rib pain.   EXAM: RIGHT RIBS - 2 VIEW   COMPARISON:  Chest radiograph-09/18/2012   FINDINGS: Unchanged cardiac silhouette and mediastinal contours with atherosclerotic plaque within the thoracic aorta. No focal parenchymal opacities. No pleural effusion or pneumothorax. No evidence of edema.   No definite displaced right-sided rib fractures. Regional soft tissues appear normal. Surgical clips overlie the left breast.   IMPRESSION: 1. No definite displaced right-sided rib fractures. 2. No acute cardiopulmonary disease. 3. Aortic Atherosclerosis (ICD10-I70.0).     Electronically Signed   By: Norleen Roulette M.D.   On: 09/21/2023 16:40      PATIENT SURVEYS:  ABC scale: 36.25 % (12/18/2023)  COGNITION: Overall cognitive status: Within functional limits for tasks assessed     SENSATION:   MUSCLE LENGTH:   POSTURE: forward flexed, decreased B knee extension  PALPATION:   LUMBAR ROM:   AROM eval  Flexion   Extension   Right lateral flexion   Left lateral flexion   Right rotation   Left rotation    (Blank rows = not tested)  LOWER EXTREMITY ROM:     Passive  Right eval Left eval  Hip flexion    Hip extension    Hip abduction    Hip adduction    Hip internal rotation    Hip external rotation    Knee flexion    Knee extension    Ankle dorsiflexion    Ankle plantarflexion    Ankle inversion    Ankle eversion     (Blank rows = not tested)  LOWER EXTREMITY MMT:    MMT Right eval Left eval  Hip flexion 4 4 at available range with L lateral thigh pulling   Hip extension (seated manually resisted) 4 3  Hip abduction (seated manually resisted) 4 4-  Hip adduction    Hip internal rotation    Hip external rotation    Knee  flexion 5 4  Knee extension 5 5  Ankle dorsiflexion    Ankle plantarflexion    Ankle inversion    Ankle eversion     (Blank rows = not tested)  LUMBAR SPECIAL TESTS:     FUNCTIONAL TESTS:  DGI: 7/24  GAIT: Distance walked: 40 ft Assistive device utilized: Single point cane Level of assistance: CGA Comments: antalgic, SPC on R hand, decreased stance L LE, decreased B knee extension, decreased step length, shuffling gait pattern whenever starting back up to walk after static position.    Pt tendency to reach too early for chair to sit, causing her center of gravity to leave her base of support.  Cues needed to walk to the chair and not to reach for the chair too early.   Cues for B femoral control to sit with proper hand placement.   Ascending stairs: R rail assist, reciprocal pattern, decreased B femoral control  Descaneind stairs: R rail assist, pt steps down with L LE first. 2 feet to a step, decreased B femoral control .    TREATMENT DATE: 02/16/2024                                                                                                                                Gait Training  Performed to improve ability to ambulate with better foot clearance and step length.   Gait with SPC and CGA 220 ft, cues for increasing step length, especially with L LE.  NuStep seat 5, arms 5, level 1 for 5 minutes to promote reciprocal, alternating movement  Cues to maintain at least 70 SPM  Gait with SPC with R and L head turns, cues for increasing step length and foot clearance 30 ft x 4   Gait with vertical head turns 30 ft x 4  Cues for increasing step length and foot clearance  Stepping over 4 mini hurdles with SPC on R 6x2  Cues for increasing hip and knee flexion for foot clearance as well as step length.     Pt tripped over with R foot 1x  Pt tripped over with L foot 2x  Reviewed POC: continue another 2x/week for the next 6 weeks starting next week.   Backwards walk 30 ft x 2  Ascending and descending stairs, emphasis on reciprocal gait pattern. 2x   Therapeutic rest breaks secondary to fatigue.  Improved exercise technique,  movement at target joints, use of target muscles after mod verbal, visual, tactile cues.          PATIENT EDUCATION:  Education details: there-ex, HEP Person educated: Patient Education method: Explanation, Demonstration, Tactile cues, Verbal cues, and Handouts Education comprehension: verbalized understanding, returned demonstration, verbal cues required, tactile cues required, and needs further education  HOME EXERCISE PROGRAM:  Seated hip flexion with opposite shoulder flexion 10x3 each LE to promote coodination and reciprocal movement  Picture provided  Gait training with rw/SPC with son, with emphasis  on hip and knee flexion, increase base of support and step length.   Pt and son Salbador) verbalized understanding   Access Code: [email or web address] URL: [email or web address] Date: 12/23/2023 Prepared by: Emil Glassman  Exercises - Sit to Stand with Counter Support  - 3 x daily - 7 x weekly - 1 sets - 5 reps - Seated Long Arc Quad  - 3 x daily - 7 x weekly - 3 sets - 10 reps - 10 seconds hold     ASSESSMENT:  CLINICAL IMPRESSION: Improving ability to initiate stepping without shuffling her feet as well as improved ability to maintain longer strep length during gait observed. Continued working on improving hip and knee flexion and foot clearance to promote ability to clear obstacles and decrease fall risk. Challenges to progress include age.  Pt tolerated session well without aggravation of symptoms. Pt will benefit from continued skilled physical therapy services to improve strength, balance, function, and gait.     OBJECTIVE IMPAIRMENTS: Abnormal gait, decreased balance, difficulty walking, decreased strength, improper body mechanics, and postural dysfunction.   ACTIVITY LIMITATIONS: carrying, lifting, standing, squatting, stairs, and locomotion level  PARTICIPATION LIMITATIONS:   PERSONAL FACTORS: Age, Fitness, Past/current experiences, Time since onset of  injury/illness/exacerbation, and 1-2 comorbidities: HTN, osteoporosis are also affecting patient's functional outcome.   REHAB POTENTIAL: Fair    CLINICAL DECISION MAKING: Stable/uncomplicated  EVALUATION COMPLEXITY: Low   GOALS: Goals reviewed with patient? Yes  SHORT TERM GOALS: Target date: 01/02/2024  Pt will be independent with her initial HEP to improve strength, femoral control, balance, and function.  Baseline: Pt has not yet started her initial HEP (12/18/2023); Doing her HEP, no questions (02/06/2024) Goal status: MET   LONG TERM GOALS: Target date: 02/20/2024  Pt will improve her Activities-specific Balance Confidence (ABC) Scale by at least 20% as a demonstration of improved balance.  Baseline: ABC scale sore: 36.25% Goal status: ONGOING  2.  Pt will improve her DGI score to 15/24 or more as a demonstration of improved balance.  Baseline: DGI score: 7/24 (12/18/2023); 6/24 (02/06/2024) Goal status: ONGOING  3.  Pt will improve her B hip flexion, extension and abduction strength by at least 1/2 MMT to improve ability to ambulate, perform standing tasks more safely and with less difficulty.   Baseline:  MMT Right eval Left eval R (02/06/2024) L (02/06/2024)  Hip flexion 4 4 at available range with L lateral thigh pulling  4+ 4+  Hip extension (seated manually resisted) 4 3 4 4   Hip abduction (seated manually resisted) 4 4- 4+ 4   (12/18/2023)  Goal status: PROGRESSING    PLAN:  PT FREQUENCY: 1-2x/week  PT DURATION: 2 weeks  PLANNED INTERVENTIONS: 97110-Therapeutic exercises, 97530- Therapeutic activity, [id number]- Neuromuscular re-education, 97535- Self Care, [id number]- Manual therapy, [id number]- Gait training, [phone]- Canalith repositioning, [id number]- Electrical stimulation (unattended), Patient/Family education, Balance training, Stair training, and Vestibular training.  PLAN FOR NEXT SESSION: placing and maintaining center of gravity over her base of support, gait training,  trunk and LE strengthening, proper mechanics, manual techniques, modalities PRN.    Eleonor Ocon, PT, DPT 02/16/2024, 12:23 PM

## 2024-02-20 ENCOUNTER — Other Ambulatory Visit: Payer: Self-pay | Admitting: Family Medicine

## 2024-02-20 ENCOUNTER — Ambulatory Visit

## 2024-02-20 DIAGNOSIS — Z9181 History of falling: Secondary | ICD-10-CM

## 2024-02-20 DIAGNOSIS — I10 Essential (primary) hypertension: Secondary | ICD-10-CM

## 2024-02-20 DIAGNOSIS — R262 Difficulty in walking, not elsewhere classified: Secondary | ICD-10-CM

## 2024-02-20 DIAGNOSIS — M6281 Muscle weakness (generalized): Secondary | ICD-10-CM

## 2024-02-20 DIAGNOSIS — R2681 Unsteadiness on feet: Secondary | ICD-10-CM

## 2024-02-20 NOTE — Therapy (Signed)
 OUTPATIENT PHYSICAL THERAPY TREATMENT    Patient Name: Anna Johnson MRN: [id number] DOB:June 18, 1941, 83 y.o., female Today's Date: 02/20/2024  END OF SESSION:  PT End of Session - 02/20/24 0939     Visit Number 13    Number of Visits 31    Date for PT Re-Evaluation 04/09/24    PT Start Time 0943    PT Stop Time 1025    PT Time Calculation (min) 42 min    Equipment Utilized During Treatment Gait belt    Activity Tolerance Patient tolerated treatment well    Behavior During Therapy [facility] for tasks assessed/performed                     Past Medical History:  Diagnosis Date   Allergy    Glaucoma    Hyperlipidemia    Hypertension    Osteoporosis    Pneumonia 03/29/2015   Past Surgical History:  Procedure Laterality Date   BREAST BIOPSY Left 03/16/2018   Affirm Biopsy- Ribbon clip- positive   BREAST LUMPECTOMY Left 04/2018   CATARACT EXTRACTION  2004   DILATION AND CURETTAGE OF UTERUS     HIP FRACTURE SURGERY  09/15/2010   left hip   WRIST FRACTURE SURGERY     ORIF   Patient Active Problem List   Diagnosis Date Noted   Chronic midline low back pain without sciatica 02/28/2021   Osteoarthritis of both shoulders 02/28/2021   Glaucoma of both eyes 04/06/2018   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 (HCC) 04/03/2018   Abnormal chest x-ray 05/08/2015   Bloodgood disease 05/08/2015   Thoracic, thoracolumbar and lumbosacral intervertebral disc disorder 05/08/2015   Osteoporosis 04/07/2015   Vitamin D  deficiency 03/29/2015   Open-angle glaucoma 03/29/2015   Hypertension 03/29/2015   Seasonal allergies 03/29/2015   Osteoarthritis 03/29/2015   Fibrocystic breast disease 03/29/2015   Compression fracture of L1 lumbar vertebra (HCC) 03/29/2015   History of hip fracture 03/29/2015   Hyperlipemia 02/28/2015    PCP: Gasper Nancyann BRAVO, MD   REFERRING PROVIDER: Gasper Nancyann BRAVO, MD   REFERRING DIAG: R26.9 (ICD-10-CM) - Gait  disturbance  Rationale for Evaluation and Treatment: Rehabilitation  THERAPY DIAG:  Unsteadiness on feet - Plan: PT plan of care cert/re-cert  Difficulty in walking, not elsewhere classified - Plan: PT plan of care cert/re-cert  Muscle weakness (generalized) - Plan: PT plan of care cert/re-cert  History of falling - Plan: PT plan of care cert/re-cert  ONSET DATE: September 17, 2023 (pt fell backwards reaching for bed post)  SUBJECTIVE:  SUBJECTIVE STATEMENT: Walking feels better.      PERTINENT HISTORY:  Gait disturbance. Pt got up in the middle of the night to go to the bathroom. Pt reached towards her bed but missed and feel onto her back, pt states she reached too early. Felt fine afterwards except 2 days with B LE discomfort. LE is now fine but L LE just feels awkward. Has a hx of L hip fx S/P surgery 15 years ago. Pt states not having pain or anything but feels like she needs exercise.    Blood pressure is controlled per pt. No latex allergies     PAIN:  None   PRECAUTIONS: Fall  RED FLAGS: No known red flags   WEIGHT BEARING RESTRICTIONS: No  FALLS:  Has patient fallen in last 6 months? Yes. Number of falls 1 first week in September 16, 2023.   LIVING ENVIRONMENT: Lives with: lives with their spouse Lives in: House/apartment Stairs: Yes: Internal: 15 steps; on right going up and External: 5 steps; bilateral but cannot reach both Has following equipment at home: Single point cane, Walker - 4 wheeled, and shower chair  OCCUPATION: retired  PLOF: Independent with household mobility with device  PATIENT GOALS: improve balance, improve ability to ambulate safely.   NEXT MD VISIT:   OBJECTIVE:  Note: Objective measures were completed at Evaluation unless otherwise  noted.  DIAGNOSTIC FINDINGS:  DG Ribs Unilateral Right 09/19/2023  Narrative & Impression  CLINICAL DATA:  Post fall, now with right-sided rib pain.   EXAM: RIGHT RIBS - 2 VIEW   COMPARISON:  Chest radiograph-09/18/2012   FINDINGS: Unchanged cardiac silhouette and mediastinal contours with atherosclerotic plaque within the thoracic aorta. No focal parenchymal opacities. No pleural effusion or pneumothorax. No evidence of edema.   No definite displaced right-sided rib fractures. Regional soft tissues appear normal. Surgical clips overlie the left breast.   IMPRESSION: 1. No definite displaced right-sided rib fractures. 2. No acute cardiopulmonary disease. 3. Aortic Atherosclerosis (ICD10-I70.0).     Electronically Signed   By: Norleen Roulette M.D.   On: 09/21/2023 16:40      PATIENT SURVEYS:  ABC scale: 36.25 % (12/18/2023)  COGNITION: Overall cognitive status: Within functional limits for tasks assessed     SENSATION:   MUSCLE LENGTH:   POSTURE: forward flexed, decreased B knee extension  PALPATION:   LUMBAR ROM:   AROM eval  Flexion   Extension   Right lateral flexion   Left lateral flexion   Right rotation   Left rotation    (Blank rows = not tested)  LOWER EXTREMITY ROM:     Passive  Right eval Left eval  Hip flexion    Hip extension    Hip abduction    Hip adduction    Hip internal rotation    Hip external rotation    Knee flexion    Knee extension    Ankle dorsiflexion    Ankle plantarflexion    Ankle inversion    Ankle eversion     (Blank rows = not tested)  LOWER EXTREMITY MMT:    MMT Right eval Left eval  Hip flexion 4 4 at available range with L lateral thigh pulling   Hip extension (seated manually resisted) 4 3  Hip abduction (seated manually resisted) 4 4-  Hip adduction    Hip internal rotation    Hip external rotation    Knee flexion 5 4  Knee extension 5 5  Ankle dorsiflexion  Ankle plantarflexion    Ankle  inversion    Ankle eversion     (Blank rows = not tested)  LUMBAR SPECIAL TESTS:    FUNCTIONAL TESTS:  DGI: 7/24  GAIT: Distance walked: 40 ft Assistive device utilized: Single point cane Level of assistance: CGA Comments: antalgic, SPC on R hand, decreased stance L LE, decreased B knee extension, decreased step length, shuffling gait pattern whenever starting back up to walk after static position.    Pt tendency to reach too early for chair to sit, causing her center of gravity to leave her base of support.  Cues needed to walk to the chair and not to reach for the chair too early.   Cues for B femoral control to sit with proper hand placement.   Ascending stairs: R rail assist, reciprocal pattern, decreased B femoral control  Descaneind stairs: R rail assist, pt steps down with L LE first. 2 feet to a step, decreased B femoral control .    TREATMENT DATE: 02/20/2024                                                                                                                                Gait training  Performed to improve ability to ambulate with better foot clearance and step length.   NuStep seat 5, arms 5, level 1 for 5 minutes to promote reciprocal, alternating movement  Cues to maintain at least 70 SPM  Directed patient with gait with normal gait speed, with changes in speed, 180 degree pivot turn, with R and L cervical rotation position, with cervical flexion and extension position, stepping around obstacles, stepping over an obstacle, ascending and descending 4 regular steps with UE assist   Seated manually resisted hip extension, abduction, flexion 1x each way for each LE  Reviewed progress/current status with PT towards goals  Stepping over 4 mini hurdles and shoe box with SPC 6x2  Cues for getting close to obstacle and improving hip and knee flexion and step length for foot clearance.   Difficulty with foot clearance during second set secondary to fatigue.    Therapeutic rest breaks secondary to fatigue.  Improved exercise technique, movement at target joints, use of target muscles after mod verbal, visual, tactile cues.          PATIENT EDUCATION:  Education details: there-ex, HEP Person educated: Patient Education method: Explanation, Demonstration, Tactile cues, Verbal cues, and Handouts Education comprehension: verbalized understanding, returned demonstration, verbal cues required, tactile cues required, and needs further education  HOME EXERCISE PROGRAM:  Seated hip flexion with opposite shoulder flexion 10x3 each LE to promote coodination and reciprocal movement  Picture provided  Gait training with rw/SPC with son, with emphasis on hip and knee flexion, increase base of support and step length.   Pt and son Salbador) verbalized understanding   Access Code: [email or web address] URL: [email or web address] Date: 12/23/2023 Prepared by: Emil Glassman  Exercises - Sit to Stand with  Counter Support  - 3 x daily - 7 x weekly - 1 sets - 5 reps - Seated Long Arc Quad  - 3 x daily - 7 x weekly - 3 sets - 10 reps - 10 seconds hold     ASSESSMENT:  CLINICAL IMPRESSION:  Pt demonstrates improved overall B hip strength, as well as improved DGI score suggesting improved balance. Pt is also observed to improve ability to initiate stepping without shuffling her feet as well as improved ability to maintain longer step length during gait observed. Continued working on improving hip and knee flexion and foot clearance to promote ability to clear obstacles and decrease fall risk. Challenges to progress include age.  Pt tolerated session well without aggravation of symptoms. Pt will benefit from continued skilled physical therapy services to improve strength, balance, function, and gait.     OBJECTIVE IMPAIRMENTS: Abnormal gait, decreased balance, difficulty walking, decreased strength, improper body mechanics, and postural dysfunction.    ACTIVITY LIMITATIONS: carrying, lifting, standing, squatting, stairs, and locomotion level  PARTICIPATION LIMITATIONS:   PERSONAL FACTORS: Age, Fitness, Past/current experiences, Time since onset of injury/illness/exacerbation, and 1-2 comorbidities: HTN, osteoporosis are also affecting patient's functional outcome.   REHAB POTENTIAL: Fair    CLINICAL DECISION MAKING: Stable/uncomplicated  EVALUATION COMPLEXITY: Low   GOALS: Goals reviewed with patient? Yes  SHORT TERM GOALS: Target date: 01/02/2024  Pt will be independent with her initial HEP to improve strength, femoral control, balance, and function.  Baseline: Pt has not yet started her initial HEP (12/18/2023); Doing her HEP, no questions (02/06/2024) Goal status: MET   LONG TERM GOALS: Target date: 04/09/2024  Pt will improve her Activities-specific Balance Confidence (ABC) Scale by at least 20% as a demonstration of improved balance.  Baseline: ABC scale sore: 36.25%, 36.25% (02/20/2024) Goal status: ONGOING  2.  Pt will improve her DGI score to 15/24 or more as a demonstration of improved balance.  Baseline: DGI score: 7/24 (12/18/2023); 6/24 (02/06/2024); 9/24 (02/20/2024) Goal status: ONGOING  3.  Pt will improve her B hip flexion, extension and abduction strength by at least 1/2 MMT to improve ability to ambulate, perform standing tasks more safely and with less difficulty.   Baseline:  MMT Right eval Left eval R (02/06/2024) L (02/06/2024) R  (02/20/2024) L (02/20/2024)  Hip flexion 4 4 at available range with L lateral thigh pulling  4+ 4+ 4+ 5  Hip extension (seated manually resisted) 4 3 4 4 4  4+  Hip abduction (seated manually resisted) 4 4- 4+ 4 5 5    (12/18/2023)  Goal status: PROGRESSING (all muscles improved except R hip extension)    PLAN:  PT FREQUENCY: 1-2x/week  PT DURATION: other: 7 weeks  PLANNED INTERVENTIONS: 97110-Therapeutic exercises, 97530- Therapeutic activity, [id number]- Neuromuscular  re-education, 97535- Self Care, 02859- Manual therapy, [id number]- Gait training, [id number]- Canalith repositioning, [id number]- Electrical stimulation (unattended), Patient/Family education, Balance training, Stair training, and Vestibular training.  PLAN FOR NEXT SESSION: placing and maintaining center of gravity over her base of support, gait training, trunk and LE strengthening, proper mechanics, manual techniques, modalities PRN.    Kasey Hansell, PT, DPT 02/20/2024, 11:28 AM

## 2024-02-23 ENCOUNTER — Encounter (INDEPENDENT_AMBULATORY_CARE_PROVIDER_SITE_OTHER): Admitting: Ophthalmology

## 2024-02-23 DIAGNOSIS — I10 Essential (primary) hypertension: Secondary | ICD-10-CM

## 2024-02-23 DIAGNOSIS — H353132 Nonexudative age-related macular degeneration, bilateral, intermediate dry stage: Secondary | ICD-10-CM

## 2024-02-23 DIAGNOSIS — H35033 Hypertensive retinopathy, bilateral: Secondary | ICD-10-CM

## 2024-02-23 DIAGNOSIS — H33302 Unspecified retinal break, left eye: Secondary | ICD-10-CM | POA: Diagnosis not present

## 2024-02-23 DIAGNOSIS — H43813 Vitreous degeneration, bilateral: Secondary | ICD-10-CM

## 2024-02-24 ENCOUNTER — Encounter: Payer: Self-pay | Admitting: Podiatry

## 2024-02-24 ENCOUNTER — Ambulatory Visit: Admitting: Podiatry

## 2024-02-24 VITALS — Ht 62.0 in | Wt 109.6 lb

## 2024-02-24 DIAGNOSIS — M79674 Pain in right toe(s): Secondary | ICD-10-CM

## 2024-02-24 DIAGNOSIS — M79675 Pain in left toe(s): Secondary | ICD-10-CM | POA: Diagnosis not present

## 2024-02-24 DIAGNOSIS — B351 Tinea unguium: Secondary | ICD-10-CM

## 2024-02-24 DIAGNOSIS — M205X1 Other deformities of toe(s) (acquired), right foot: Secondary | ICD-10-CM

## 2024-02-24 DIAGNOSIS — D2371 Other benign neoplasm of skin of right lower limb, including hip: Secondary | ICD-10-CM

## 2024-02-24 NOTE — Progress Notes (Unsigned)
   Chief Complaint  Patient presents with   Nail Problem    Pt is here for RFC.    HPI: 83 y.o. female presenting today for follow-up evaluation of pain and tenderness associated to the right fifth toe.  Patient states that the skin lesions to the plantar aspect of the foot have returned and are becoming symptomatic although she has been wearing the wide shoes.  Also requesting nail debridement today.  Past Medical History:  Diagnosis Date   Allergy    Glaucoma    Hyperlipidemia    Hypertension    Osteoporosis    Pneumonia 03/29/2015    Past Surgical History:  Procedure Laterality Date   BREAST BIOPSY Left 03/16/2018   Affirm Biopsy- Ribbon clip- positive   BREAST LUMPECTOMY Left 04/2018   CATARACT EXTRACTION  2004   DILATION AND CURETTAGE OF UTERUS     HIP FRACTURE SURGERY  09/15/2010   left hip   WRIST FRACTURE SURGERY     ORIF    Allergies  Allergen Reactions   Codeine Other (See Comments)   Seasonal Ic [Cholestatin]    Covera-Hs [Verapamil] Rash    Physical Exam: General: The patient is alert and oriented x3 in no acute distress.  Dermatology: No open wounds noted.  Hyperkeratotic callus tissue noted between the 4th and 5th digit right foot.  Hyperkeratotic dystrophic nails also noted 1-5 bilateral  Vascular: Palpable pedal pulses bilaterally. Capillary refill within normal limits.  No appreciable edema.  No erythema.  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  Adductovarus hammertoe fifth digit right foot 3.  Pain due to onychomycosis of toenails both  -Patient evaluated -Again today we did discuss the possibility of hammertoe corrective surgery to the right foot however the patient would not like to pursue any surgical option at this time.  We will continue to  pursue conservative management - Excisional debridement of the hyperkeratotic callus tissue was performed today using 312 scalpel and tissue nipper without incident or bleeding.  Patient did feel some relief.  Salicylic acid and Band-Aid applied -Mechanical debridement of nails 1-5 bilateral performed using a nail nipper without incident or bleeding -Continue to wear wide fitting shoes that do not irritate or constrict the toebox area -Return to clinic PRN       Thresa EMERSON Sar, DPM [facility]  Dr. Thresa EMERSON Sar, DPM    [address] [place], [place] [address]                Office [phone]  Fax [phone]

## 2024-02-25 ENCOUNTER — Encounter

## 2024-03-01 ENCOUNTER — Ambulatory Visit

## 2024-03-01 DIAGNOSIS — R262 Difficulty in walking, not elsewhere classified: Secondary | ICD-10-CM

## 2024-03-01 DIAGNOSIS — R2681 Unsteadiness on feet: Secondary | ICD-10-CM | POA: Diagnosis not present

## 2024-03-01 DIAGNOSIS — Z9181 History of falling: Secondary | ICD-10-CM

## 2024-03-01 DIAGNOSIS — M6281 Muscle weakness (generalized): Secondary | ICD-10-CM

## 2024-03-01 NOTE — Therapy (Signed)
 OUTPATIENT PHYSICAL THERAPY TREATMENT    Patient Name: Anna Johnson MRN: [id number] DOB:03-06-1941, 83 y.o., female Today's Date: 03/01/2024  END OF SESSION:  PT End of Session - 03/01/24 1032     Visit Number 14    Number of Visits 31    Date for PT Re-Evaluation 04/09/24    PT Start Time 1032    PT Stop Time 1111    PT Time Calculation (min) 39 min    Equipment Utilized During Treatment Gait belt    Activity Tolerance Patient tolerated treatment well    Behavior During Therapy WFL for tasks assessed/performed                      Past Medical History:  Diagnosis Date   Allergy    Glaucoma    Hyperlipidemia    Hypertension    Osteoporosis    Pneumonia 03/29/2015   Past Surgical History:  Procedure Laterality Date   BREAST BIOPSY Left 03/16/2018   Affirm Biopsy- Ribbon clip- positive   BREAST LUMPECTOMY Left 04/2018   CATARACT EXTRACTION  2004   DILATION AND CURETTAGE OF UTERUS     HIP FRACTURE SURGERY  09/15/2010   left hip   WRIST FRACTURE SURGERY     ORIF   Patient Active Problem List   Diagnosis Date Noted   Chronic midline low back pain without sciatica 02/28/2021   Osteoarthritis of both shoulders 02/28/2021   Glaucoma of both eyes 04/06/2018   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 (HCC) 04/03/2018   Abnormal chest x-ray 05/08/2015   Bloodgood disease 05/08/2015   Thoracic, thoracolumbar and lumbosacral intervertebral disc disorder 05/08/2015   Osteoporosis 04/07/2015   Vitamin D  deficiency 03/29/2015   Open-angle glaucoma 03/29/2015   Hypertension 03/29/2015   Seasonal allergies 03/29/2015   Osteoarthritis 03/29/2015   Fibrocystic breast disease 03/29/2015   Compression fracture of L1 lumbar vertebra (HCC) 03/29/2015   History of hip fracture 03/29/2015   Hyperlipemia 02/28/2015    PCP: Gasper Nancyann BRAVO, MD   REFERRING PROVIDER: Gasper Nancyann BRAVO, MD   REFERRING DIAG: R26.9 (ICD-10-CM) - Gait  disturbance  Rationale for Evaluation and Treatment: Rehabilitation  THERAPY DIAG:  Unsteadiness on feet  Difficulty in walking, not elsewhere classified  Muscle weakness (generalized)  History of falling  ONSET DATE: September 17, 2023 (pt fell backwards reaching for bed post)  SUBJECTIVE:  SUBJECTIVE STATEMENT: Walking feels better.      PERTINENT HISTORY:  Gait disturbance. Pt got up in the middle of the night to go to the bathroom. Pt reached towards her bed but missed and feel onto her back, pt states she reached too early. Felt fine afterwards except 2 days with B LE discomfort. LE is now fine but L LE just feels awkward. Has a hx of L hip fx S/P surgery 15 years ago. Pt states not having pain or anything but feels like she needs exercise.    Blood pressure is controlled per pt. No latex allergies     PAIN:  None   PRECAUTIONS: Fall  RED FLAGS: No known red flags   WEIGHT BEARING RESTRICTIONS: No  FALLS:  Has patient fallen in last 6 months? Yes. Number of falls 1 first week in September 16, 2023.   LIVING ENVIRONMENT: Lives with: lives with their spouse Lives in: House/apartment Stairs: Yes: Internal: 15 steps; on right going up and External: 5 steps; bilateral but cannot reach both Has following equipment at home: Single point cane, Walker - 4 wheeled, and shower chair  OCCUPATION: retired  PLOF: Independent with household mobility with device  PATIENT GOALS: improve balance, improve ability to ambulate safely.   NEXT MD VISIT:   OBJECTIVE:  Note: Objective measures were completed at Evaluation unless otherwise noted.  DIAGNOSTIC FINDINGS:  DG Ribs Unilateral Right 09/19/2023  Narrative & Impression  CLINICAL DATA:  Post fall, now with right-sided rib pain.    EXAM: RIGHT RIBS - 2 VIEW   COMPARISON:  Chest radiograph-09/18/2012   FINDINGS: Unchanged cardiac silhouette and mediastinal contours with atherosclerotic plaque within the thoracic aorta. No focal parenchymal opacities. No pleural effusion or pneumothorax. No evidence of edema.   No definite displaced right-sided rib fractures. Regional soft tissues appear normal. Surgical clips overlie the left breast.   IMPRESSION: 1. No definite displaced right-sided rib fractures. 2. No acute cardiopulmonary disease. 3. Aortic Atherosclerosis (ICD10-I70.0).     Electronically Signed   By: Norleen Roulette M.D.   On: 09/21/2023 16:40      PATIENT SURVEYS:  ABC scale: 36.25 % (12/18/2023)  COGNITION: Overall cognitive status: Within functional limits for tasks assessed     SENSATION:   MUSCLE LENGTH:   POSTURE: forward flexed, decreased B knee extension  PALPATION:   LUMBAR ROM:   AROM eval  Flexion   Extension   Right lateral flexion   Left lateral flexion   Right rotation   Left rotation    (Blank rows = not tested)  LOWER EXTREMITY ROM:     Passive  Right eval Left eval  Hip flexion    Hip extension    Hip abduction    Hip adduction    Hip internal rotation    Hip external rotation    Knee flexion    Knee extension    Ankle dorsiflexion    Ankle plantarflexion    Ankle inversion    Ankle eversion     (Blank rows = not tested)  LOWER EXTREMITY MMT:    MMT Right eval Left eval  Hip flexion 4 4 at available range with L lateral thigh pulling   Hip extension (seated manually resisted) 4 3  Hip abduction (seated manually resisted) 4 4-  Hip adduction    Hip internal rotation    Hip external rotation    Knee flexion 5 4  Knee extension 5 5  Ankle dorsiflexion  Ankle plantarflexion    Ankle inversion    Ankle eversion     (Blank rows = not tested)  LUMBAR SPECIAL TESTS:    FUNCTIONAL TESTS:  DGI: 7/24  GAIT: Distance walked: 40  ft Assistive device utilized: Single point cane Level of assistance: CGA Comments: antalgic, SPC on R hand, decreased stance L LE, decreased B knee extension, decreased step length, shuffling gait pattern whenever starting back up to walk after static position.    Pt tendency to reach too early for chair to sit, causing her center of gravity to leave her base of support.  Cues needed to walk to the chair and not to reach for the chair too early.   Cues for B femoral control to sit with proper hand placement.   Ascending stairs: R rail assist, reciprocal pattern, decreased B femoral control  Descaneind stairs: R rail assist, pt steps down with L LE first. 2 feet to a step, decreased B femoral control .    TREATMENT DATE: 03/01/2024                                                                                                                                Gait training  Performed to improve ability to ambulate with better foot clearance and step length.   NuStep seat 5, arms 5, level 1 for 5 minutes to promote reciprocal, alternating movement  Cues to maintain at least 70 SPM  Stepping over 4 mini hurdles and shoe box and gray bolster with SPC and CGA 3x, 4x  Cues for getting close to obstacle and improving hip and knee flexion and step length for foot clearance.     Gait with R and L head turns 30 ft 6x, CGA  Gait backwards with CGA 30 ft x 2  Sit <> stand from regular chair with arms with B UE assist 5x3   Therapeutic rest breaks secondary to fatigue.  Improved exercise technique, movement at target joints, use of target muscles after mod verbal, visual, tactile cues.          PATIENT EDUCATION:  Education details: there-ex, HEP Person educated: Patient Education method: Explanation, Demonstration, Tactile cues, Verbal cues, and Handouts Education comprehension: verbalized understanding, returned demonstration, verbal cues required, tactile cues required, and needs  further education  HOME EXERCISE PROGRAM:  Seated hip flexion with opposite shoulder flexion 10x3 each LE to promote coodination and reciprocal movement  Picture provided  Gait training with rw/SPC with son, with emphasis on hip and knee flexion, increase base of support and step length.   Pt and son Salbador) verbalized understanding   Access Code: [email or web address] URL: [email or web address] Date: 12/23/2023 Prepared by: Emil Glassman  Exercises - Sit to Stand with Counter Support  - 3 x daily - 7 x weekly - 1 sets - 5 reps - Seated Long Arc Quad  - 3 x daily - 7 x weekly - 3 sets - 10 reps -  10 seconds hold     ASSESSMENT:  CLINICAL IMPRESSION:  Continued working on improving hip and knee flexion and foot clearance to promote ability to clear obstacles and decrease fall risk. Added grey bolster which is a little higher to help cue pt to lift her LE's up higher when clearing obstacles. Able to step over orange mini hurdles with much less difficulty observed compared to previous sessions. Difficulty clearing higher and wider obstacles. Challenges to progress include age.  Pt tolerated session well without aggravation of symptoms. Pt will benefit from continued skilled physical therapy services to improve strength, balance, function, and gait.     OBJECTIVE IMPAIRMENTS: Abnormal gait, decreased balance, difficulty walking, decreased strength, improper body mechanics, and postural dysfunction.   ACTIVITY LIMITATIONS: carrying, lifting, standing, squatting, stairs, and locomotion level  PARTICIPATION LIMITATIONS:   PERSONAL FACTORS: Age, Fitness, Past/current experiences, Time since onset of injury/illness/exacerbation, and 1-2 comorbidities: HTN, osteoporosis are also affecting patient's functional outcome.   REHAB POTENTIAL: Fair    CLINICAL DECISION MAKING: Stable/uncomplicated  EVALUATION COMPLEXITY: Low   GOALS: Goals reviewed with patient? Yes  SHORT TERM GOALS:  Target date: 01/02/2024  Pt will be independent with her initial HEP to improve strength, femoral control, balance, and function.  Baseline: Pt has not yet started her initial HEP (12/18/2023); Doing her HEP, no questions (02/06/2024) Goal status: MET   LONG TERM GOALS: Target date: 04/09/2024  Pt will improve her Activities-specific Balance Confidence (ABC) Scale by at least 20% as a demonstration of improved balance.  Baseline: ABC scale sore: 36.25%, 36.25% (02/20/2024) Goal status: ONGOING  2.  Pt will improve her DGI score to 15/24 or more as a demonstration of improved balance.  Baseline: DGI score: 7/24 (12/18/2023); 6/24 (02/06/2024); 9/24 (02/20/2024) Goal status: ONGOING  3.  Pt will improve her B hip flexion, extension and abduction strength by at least 1/2 MMT to improve ability to ambulate, perform standing tasks more safely and with less difficulty.   Baseline:  MMT Right eval Left eval R (02/06/2024) L (02/06/2024) R  (02/20/2024) L (02/20/2024)  Hip flexion 4 4 at available range with L lateral thigh pulling  4+ 4+ 4+ 5  Hip extension (seated manually resisted) 4 3 4 4 4  4+  Hip abduction (seated manually resisted) 4 4- 4+ 4 5 5    (12/18/2023)  Goal status: PROGRESSING (all muscles improved except R hip extension)    PLAN:  PT FREQUENCY: 1-2x/week  PT DURATION: other: 7 weeks  PLANNED INTERVENTIONS: 97110-Therapeutic exercises, 97530- Therapeutic activity, [id number]- Neuromuscular re-education, 97535- Self Care, [id number]- Manual therapy, [id number]- Gait training, [phone]- Canalith repositioning, [id number]- Electrical stimulation (unattended), Patient/Family education, Balance training, Stair training, and Vestibular training.  PLAN FOR NEXT SESSION: placing and maintaining center of gravity over her base of support, gait training, trunk and LE strengthening, proper mechanics, manual techniques, modalities PRN.    Gailene Youkhana, PT, DPT 03/01/2024, 11:17 AM

## 2024-03-16 ENCOUNTER — Ambulatory Visit: Attending: Family Medicine

## 2024-03-16 ENCOUNTER — Ambulatory Visit: Payer: Self-pay | Admitting: *Deleted

## 2024-03-16 DIAGNOSIS — R262 Difficulty in walking, not elsewhere classified: Secondary | ICD-10-CM | POA: Diagnosis not present

## 2024-03-16 DIAGNOSIS — R2681 Unsteadiness on feet: Secondary | ICD-10-CM | POA: Diagnosis not present

## 2024-03-16 DIAGNOSIS — M6281 Muscle weakness (generalized): Secondary | ICD-10-CM | POA: Diagnosis not present

## 2024-03-16 DIAGNOSIS — Z9181 History of falling: Secondary | ICD-10-CM | POA: Diagnosis not present

## 2024-03-16 NOTE — Telephone Encounter (Signed)
 FYI Only or Action Required?: Action required by provider: request for appointment.  Patient was last seen in primary care on 12/12/2023 by Gasper Nancyann BRAVO, MD.  Called Nurse Triage reporting Facial Swelling.  Symptoms began a week ago.  Interventions attempted: Rest, hydration, or home remedies.  Symptoms are: gradually worsening.  Triage Disposition: See PCP When Office is Open (Within 3 Days)  Patient/caregiver understands and will follow disposition?: Yes               Copied from CRM [id number]. Topic: Clinical - Red Word Triage >> Mar 16, 2024 11:21 AM Tiffini S wrote: [facility] that prompted transfer to Nurse Triage: Patient bumped her left side of lower chin a week ago- showed a three days later and is swollen. Area haven't gotten better and is worsening Reason for Disposition  [1] MILD face swelling (e.g., puffiness) AND [2] persists > 3 days  Answer Assessment - Initial Assessment Questions No available appt with PCP until Aug 24. Patient requesting to see PCP instead but agreed to schedule appt with other provider 03/18/24. Please advise if PCP can see patient      1. ONSET: When did the swelling start? (e.g., minutes, hours, days)     1 week ago  2. LOCATION: What part of the face is swollen? (e.g., cheek, entire face, jaw joint area, under jaw)     Right center area of chin 3. SEVERITY: How swollen is it?     Swelling is noticeable  4. ITCHING: Is there any itching? If Yes, ask: How much?   (Scale 1-10; mild, moderate or severe)     na 5. PAIN: Is the swelling painful to touch? If Yes, ask: How painful is it?   (Scale 0-10; mild, moderate or severe)     Sensitive to touch 6. FEVER: Do you have a fever? If Yes, ask: What is it, how was it measured, and when did it start?      na 7. CAUSE: What do you think is causing the face swelling?     Edge of car door hit chin 8. NEW MEDICINES: Have there been any new medicines started recently?      na 9. RECURRENT SYMPTOM: Have you had face swelling before? If Yes, ask: When was the last time? What happened that time?     Swelling started couple days after being hit by car door 10. OTHER SYMPTOMS: Do you have any other symptoms? (e.g., leg swelling, toothache)       Little bruising , redness, swelling tender to touch 11. PREGNANCY: Is there any chance you are pregnant? When was your last menstrual period?       na  Protocols used: Face Swelling-A-AH

## 2024-03-16 NOTE — Therapy (Signed)
 OUTPATIENT PHYSICAL THERAPY TREATMENT    Patient Name: Anna Johnson MRN: [id number] DOB:[birth year], 83 y.o., female Today's Date: 03/16/2024  END OF SESSION:  PT End of Session - 03/16/24 0908     Visit Number 15    Number of Visits 31    Date for PT Re-Evaluation 04/09/24    PT Start Time 0908    PT Stop Time 0949    PT Time Calculation (min) 41 min    Equipment Utilized During Treatment Gait belt    Activity Tolerance Patient tolerated treatment well    Behavior During Therapy [facility] for tasks assessed/performed                       Past Medical History:  Diagnosis Date   Allergy    Glaucoma    Hyperlipidemia    Hypertension    Osteoporosis    Pneumonia 03/29/2015   Past Surgical History:  Procedure Laterality Date   BREAST BIOPSY Left 03/16/2018   Affirm Biopsy- Ribbon clip- positive   BREAST LUMPECTOMY Left 04/2018   CATARACT EXTRACTION  2004   DILATION AND CURETTAGE OF UTERUS     HIP FRACTURE SURGERY  09/15/2010   left hip   WRIST FRACTURE SURGERY     ORIF   Patient Active Problem List   Diagnosis Date Noted   Chronic midline low back pain without sciatica 02/28/2021   Osteoarthritis of both shoulders 02/28/2021   Glaucoma of both eyes 04/06/2018   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 (HCC) 04/03/2018   Abnormal chest x-ray 05/08/2015   Bloodgood disease 05/08/2015   Thoracic, thoracolumbar and lumbosacral intervertebral disc disorder 05/08/2015   Osteoporosis 04/07/2015   Vitamin D  deficiency 03/29/2015   Open-angle glaucoma 03/29/2015   Hypertension 03/29/2015   Seasonal allergies 03/29/2015   Osteoarthritis 03/29/2015   Fibrocystic breast disease 03/29/2015   Compression fracture of L1 lumbar vertebra (HCC) 03/29/2015   History of hip fracture 03/29/2015   Hyperlipemia 02/28/2015    PCP: Gasper Nancyann BRAVO, MD   REFERRING PROVIDER: Gasper Nancyann BRAVO, MD   REFERRING DIAG: R26.9 (ICD-10-CM) - Gait  disturbance  Rationale for Evaluation and Treatment: Rehabilitation  THERAPY DIAG:  Unsteadiness on feet  Difficulty in walking, not elsewhere classified  Muscle weakness (generalized)  History of falling  ONSET DATE: September 17, 2023 (pt fell backwards reaching for bed post)  SUBJECTIVE:  SUBJECTIVE STATEMENT: Forgot some stuff.      PERTINENT HISTORY:  Gait disturbance. Pt got up in the middle of the night to go to the bathroom. Pt reached towards her bed but missed and feel onto her back, pt states she reached too early. Felt fine afterwards except 2 days with B LE discomfort. LE is now fine but L LE just feels awkward. Has a hx of L hip fx S/P surgery 15 years ago. Pt states not having pain or anything but feels like she needs exercise.    Blood pressure is controlled per pt. No latex allergies     PAIN:  None   PRECAUTIONS: Fall  RED FLAGS: No known red flags   WEIGHT BEARING RESTRICTIONS: No  FALLS:  Has patient fallen in last 6 months? Yes. Number of falls 1 first week in September 16, 2023.   LIVING ENVIRONMENT: Lives with: lives with their spouse Lives in: House/apartment Stairs: Yes: Internal: 15 steps; on right going up and External: 5 steps; bilateral but cannot reach both Has following equipment at home: Single point cane, Walker - 4 wheeled, and shower chair  OCCUPATION: retired  PLOF: Independent with household mobility with device  PATIENT GOALS: improve balance, improve ability to ambulate safely.   NEXT MD VISIT:   OBJECTIVE:  Note: Objective measures were completed at Evaluation unless otherwise noted.  DIAGNOSTIC FINDINGS:  DG Ribs Unilateral Right 09/19/2023  Narrative & Impression  CLINICAL DATA:  Post fall, now with right-sided rib pain.    EXAM: RIGHT RIBS - 2 VIEW   COMPARISON:  Chest radiograph-09/18/2012   FINDINGS: Unchanged cardiac silhouette and mediastinal contours with atherosclerotic plaque within the thoracic aorta. No focal parenchymal opacities. No pleural effusion or pneumothorax. No evidence of edema.   No definite displaced right-sided rib fractures. Regional soft tissues appear normal. Surgical clips overlie the left breast.   IMPRESSION: 1. No definite displaced right-sided rib fractures. 2. No acute cardiopulmonary disease. 3. Aortic Atherosclerosis (ICD10-I70.0).     Electronically Signed   By: Norleen Roulette M.D.   On: 09/21/2023 16:40      PATIENT SURVEYS:  ABC scale: 36.25 % (12/18/2023)  COGNITION: Overall cognitive status: Within functional limits for tasks assessed     SENSATION:   MUSCLE LENGTH:   POSTURE: forward flexed, decreased B knee extension  PALPATION:   LUMBAR ROM:   AROM eval  Flexion   Extension   Right lateral flexion   Left lateral flexion   Right rotation   Left rotation    (Blank rows = not tested)  LOWER EXTREMITY ROM:     Passive  Right eval Left eval  Hip flexion    Hip extension    Hip abduction    Hip adduction    Hip internal rotation    Hip external rotation    Knee flexion    Knee extension    Ankle dorsiflexion    Ankle plantarflexion    Ankle inversion    Ankle eversion     (Blank rows = not tested)  LOWER EXTREMITY MMT:    MMT Right eval Left eval  Hip flexion 4 4 at available range with L lateral thigh pulling   Hip extension (seated manually resisted) 4 3  Hip abduction (seated manually resisted) 4 4-  Hip adduction    Hip internal rotation    Hip external rotation    Knee flexion 5 4  Knee extension 5 5  Ankle dorsiflexion  Ankle plantarflexion    Ankle inversion    Ankle eversion     (Blank rows = not tested)  LUMBAR SPECIAL TESTS:    FUNCTIONAL TESTS:  DGI: 7/24  GAIT: Distance walked: 40  ft Assistive device utilized: Single point cane Level of assistance: CGA Comments: antalgic, SPC on R hand, decreased stance L LE, decreased B knee extension, decreased step length, shuffling gait pattern whenever starting back up to walk after static position.    Pt tendency to reach too early for chair to sit, causing her center of gravity to leave her base of support.  Cues needed to walk to the chair and not to reach for the chair too early.   Cues for B femoral control to sit with proper hand placement.   Ascending stairs: R rail assist, reciprocal pattern, decreased B femoral control  Descaneind stairs: R rail assist, pt steps down with L LE first. 2 feet to a step, decreased B femoral control .    TREATMENT DATE: 03/16/2024                                                                                                                                Gait training  Performed to improve ability to ambulate with better foot clearance and step length.   NuStep seat 5, arms 5, level 1 for 5 minutes to promote reciprocal, alternating movement  Cues to maintain at least 70 SPM  Stepping over 4 mini hurdles with SPC and CGA 6x 2  Cues for getting close to obstacle and improving hip and knee flexion and step length for foot clearance.    Gait around gym with emphasis on increasing step length and foot clearance.  With SPC 202 ft, then 200 ft then 300 ft  Sit <> stand from regular chair with arms with B UE assist 5x3  Gait with R and L head turns 30 ft 6x, CGA   Therapeutic rest breaks secondary to fatigue.  Improved exercise technique, movement at target joints, use of target muscles after mod verbal, visual, tactile cues.          PATIENT EDUCATION:  Education details: there-ex, HEP Person educated: Patient Education method: Explanation, Demonstration, Tactile cues, Verbal cues, and Handouts Education comprehension: verbalized understanding, returned demonstration,  verbal cues required, tactile cues required, and needs further education  HOME EXERCISE PROGRAM:  Seated hip flexion with opposite shoulder flexion 10x3 each LE to promote coodination and reciprocal movement  Picture provided  Gait training with rw/SPC with son, with emphasis on hip and knee flexion, increase base of support and step length.   Pt and son Salbador) verbalized understanding   Access Code: [email or web address] URL: [email or web address] Date: 12/23/2023 Prepared by: Emil Glassman  Exercises - Sit to Stand with Counter Support  - 3 x daily - 7 x weekly - 1 sets - 5 reps - Seated Long Arc Quad  - 3 x daily - 7  x weekly - 3 sets - 10 reps - 10 seconds hold     ASSESSMENT:  CLINICAL IMPRESSION:  Continued working on improving hip and knee flexion and foot clearance to promote ability to clear obstacles and decrease fall risk with moderate cues needed. Improved ability to clear obstacles with practice. Demonstrates tendency to return to shuffling gait.  Pt tolerated session well without aggravation of symptoms. Pt will benefit from continued skilled physical therapy services to improve strength, balance, function, and gait.     OBJECTIVE IMPAIRMENTS: Abnormal gait, decreased balance, difficulty walking, decreased strength, improper body mechanics, and postural dysfunction.   ACTIVITY LIMITATIONS: carrying, lifting, standing, squatting, stairs, and locomotion level  PARTICIPATION LIMITATIONS:   PERSONAL FACTORS: Age, Fitness, Past/current experiences, Time since onset of injury/illness/exacerbation, and 1-2 comorbidities: HTN, osteoporosis are also affecting patient's functional outcome.   REHAB POTENTIAL: Fair    CLINICAL DECISION MAKING: Stable/uncomplicated  EVALUATION COMPLEXITY: Low   GOALS: Goals reviewed with patient? Yes  SHORT TERM GOALS: Target date: 01/02/2024  Pt will be independent with her initial HEP to improve strength, femoral control,  balance, and function.  Baseline: Pt has not yet started her initial HEP (12/18/2023); Doing her HEP, no questions (02/06/2024) Goal status: MET   LONG TERM GOALS: Target date: 04/09/2024  Pt will improve her Activities-specific Balance Confidence (ABC) Scale by at least 20% as a demonstration of improved balance.  Baseline: ABC scale sore: 36.25%, 36.25% (02/20/2024) Goal status: ONGOING  2.  Pt will improve her DGI score to 15/24 or more as a demonstration of improved balance.  Baseline: DGI score: 7/24 (12/18/2023); 6/24 (02/06/2024); 9/24 (02/20/2024) Goal status: ONGOING  3.  Pt will improve her B hip flexion, extension and abduction strength by at least 1/2 MMT to improve ability to ambulate, perform standing tasks more safely and with less difficulty.   Baseline:  MMT Right eval Left eval R (02/06/2024) L (02/06/2024) R  (02/20/2024) L (02/20/2024)  Hip flexion 4 4 at available range with L lateral thigh pulling  4+ 4+ 4+ 5  Hip extension (seated manually resisted) 4 3 4 4 4  4+  Hip abduction (seated manually resisted) 4 4- 4+ 4 5 5    (12/18/2023)  Goal status: PROGRESSING (all muscles improved except R hip extension)    PLAN:  PT FREQUENCY: 1-2x/week  PT DURATION: other: 7 weeks  PLANNED INTERVENTIONS: [id number]-Therapeutic exercises, 97530- Therapeutic activity, [id number]- Neuromuscular re-education, 97535- Self Care, [id number]- Manual therapy, [id number]- Gait training, [phone]- Canalith repositioning, [id number]- Electrical stimulation (unattended), Patient/Family education, Balance training, Stair training, and Vestibular training.  PLAN FOR NEXT SESSION: placing and maintaining center of gravity over her base of support, gait training, trunk and LE strengthening, proper mechanics, manual techniques, modalities PRN.    Nafisa Olds, PT, DPT 03/16/2024, 12:41 PM

## 2024-03-18 ENCOUNTER — Ambulatory Visit: Admitting: Physician Assistant

## 2024-03-18 ENCOUNTER — Emergency Department

## 2024-03-18 ENCOUNTER — Emergency Department
Admission: EM | Admit: 2024-03-18 | Discharge: 2024-03-18 | Disposition: A | Discharge status: Home / Self Care | Source: Ambulatory Visit | Attending: Emergency Medicine | Admitting: Emergency Medicine

## 2024-03-18 ENCOUNTER — Ambulatory Visit

## 2024-03-18 ENCOUNTER — Ambulatory Visit (INDEPENDENT_AMBULATORY_CARE_PROVIDER_SITE_OTHER): Admitting: Surgery

## 2024-03-18 ENCOUNTER — Other Ambulatory Visit: Payer: Self-pay

## 2024-03-18 ENCOUNTER — Encounter: Payer: Self-pay | Admitting: Physician Assistant

## 2024-03-18 VITALS — BP 155/71 | HR 67 | Temp 98.2°F | Ht 62.0 in | Wt 110.0 lb

## 2024-03-18 VITALS — BP 129/58 | HR 66 | Resp 14 | Ht 62.0 in | Wt 110.1 lb

## 2024-03-18 DIAGNOSIS — M6281 Muscle weakness (generalized): Secondary | ICD-10-CM | POA: Diagnosis not present

## 2024-03-18 DIAGNOSIS — K122 Cellulitis and abscess of mouth: Secondary | ICD-10-CM | POA: Insufficient documentation

## 2024-03-18 DIAGNOSIS — L0291 Cutaneous abscess, unspecified: Secondary | ICD-10-CM

## 2024-03-18 DIAGNOSIS — R2681 Unsteadiness on feet: Secondary | ICD-10-CM | POA: Diagnosis not present

## 2024-03-18 DIAGNOSIS — R262 Difficulty in walking, not elsewhere classified: Secondary | ICD-10-CM

## 2024-03-18 DIAGNOSIS — Z9181 History of falling: Secondary | ICD-10-CM

## 2024-03-18 DIAGNOSIS — M272 Inflammatory conditions of jaws: Secondary | ICD-10-CM | POA: Diagnosis not present

## 2024-03-18 DIAGNOSIS — L0201 Cutaneous abscess of face: Secondary | ICD-10-CM | POA: Insufficient documentation

## 2024-03-18 LAB — BASIC METABOLIC PANEL WITH GFR
Anion gap: 12 (ref 5–15)
BUN: 19 mg/dL (ref 8–23)
CO2: 28 mmol/L (ref 22–32)
Calcium: 9.8 mg/dL (ref 8.9–10.3)
Chloride: 92 mmol/L — ABNORMAL LOW (ref 98–111)
Creatinine, Ser: 0.62 mg/dL (ref 0.44–1.00)
GFR, Estimated: >60 mL/min (ref >60)
Glucose, Bld: 101 mg/dL — ABNORMAL HIGH (ref 70–99)
Potassium: 3.7 mmol/L (ref 3.5–5.1)
Sodium: 132 mmol/L — ABNORMAL LOW (ref 135–145)

## 2024-03-18 LAB — CBC
HCT: 37 % (ref 36.0–46.0)
Hemoglobin: 12.2 g/dL (ref 12.0–15.0)
MCH: 31.8 pg (ref 26.0–34.0)
MCHC: 33 g/dL (ref 30.0–36.0)
MCV: 96.4 fL (ref 80.0–100.0)
Platelets: 373 K/uL (ref 150–400)
RBC: 3.84 MIL/uL — ABNORMAL LOW (ref 3.87–5.11)
RDW: 13.2 % (ref 11.5–15.5)
WBC: 12.4 K/uL — ABNORMAL HIGH (ref 4.0–10.5)
nRBC: 0 % (ref 0.0–0.2)

## 2024-03-18 LAB — LACTIC ACID, PLASMA: Lactic Acid, Venous: 1.2 mmol/L (ref 0.5–1.9)

## 2024-03-18 MED ORDER — VANCOMYCIN HCL 1250 MG/250ML IV SOLN
1250.0000 mg | Freq: Once | INTRAVENOUS | Status: AC
  Administered 2024-03-18: 1250 mg via INTRAVENOUS
  Filled 2024-03-18: qty 250

## 2024-03-18 MED ORDER — IOHEXOL 300 MG/ML  SOLN
100.0000 mL | Freq: Once | INTRAMUSCULAR | Status: AC | PRN
  Administered 2024-03-18: 100 mL via INTRAVENOUS

## 2024-03-18 MED ORDER — DOXYCYCLINE HYCLATE 100 MG PO TABS: 100.0000 mg | ORAL_TABLET | Freq: Two times a day (BID) | ORAL | 0 refills | Status: AC

## 2024-03-18 MED ORDER — SODIUM CHLORIDE 0.9 % IV SOLN
2.0000 g | Freq: Once | INTRAVENOUS | Status: AC
  Administered 2024-03-18: 2 g via INTRAVENOUS
  Filled 2024-03-18: qty 20

## 2024-03-18 MED ORDER — VANCOMYCIN HCL 1250 MG/250ML IV SOLN
1250.0000 mg | Freq: Once | INTRAVENOUS | Status: DC
  Filled 2024-03-18: qty 250

## 2024-03-18 NOTE — Progress Notes (Signed)
 Patient ID: Anna Johnson, female   DOB: [birth year], 83 y.o.   MRN: [id number]  Chief Complaint: Submental abscess  History of Present Illness Anna Johnson is a 83 y.o. female with a history of some periodontal disease, implants, having been seen most recently in late June by [facility] oral maxillofacial service.  Prior documentation indicated having some issues in this area but were essentially resolved and stable for follow-up in a year.  She presents after having some mild injury by a car door to this area about a week prior.  She saw no abrasion, laceration or bruising to the area.  There was some swelling that progressed over the weekend over the last 2 to 3 days some significant increased swelling, to the point of fluctuance and pointing in the submental area just right of midline.  A photo was taken by her primary care provider prior to her presentation here.  We took additional photos however for some reason they were not saved to the EHR.  She denies fevers and chills.  She denies any intraoral pain or drainage.  She has had no external drainage.  She is remarkably tolerant of this and reports very little pain or tenderness.  She has no complaint of dysphagia, odynophagia, or shortness of breath.  Past Medical History Past Medical History:  Diagnosis Date   Allergy    Glaucoma    Hyperlipidemia    Hypertension    Osteoporosis    Pneumonia 03/29/2015      Past Surgical History:  Procedure Laterality Date   BREAST BIOPSY Left 03/16/2018   Affirm Biopsy- Ribbon clip- positive   BREAST LUMPECTOMY Left 04/2018   CATARACT EXTRACTION  2004   DILATION AND CURETTAGE OF UTERUS     HIP FRACTURE SURGERY  09/15/2010   left hip   WRIST FRACTURE SURGERY     ORIF    Allergies  Allergen Reactions   Codeine Other (See Comments)   Seasonal Ic [Cholestatin]    Covera-Hs [Verapamil] Rash    Current Outpatient Medications  Medication Sig Dispense Refill   Acetaminophen 500 MG capsule Take 2  capsules by mouth as needed for fever.     alendronate  (FOSAMAX ) 70 MG tablet TAKE 1 TABLET BY MOUTH WEEKLY. TAKE IN THE MORNING WITH 8 OZ OF WATER. DO NOT EAT, DRINK OR LIE DOWN FOR 30 MIN AFTER. 4 tablet 12   amLODipine  (NORVASC ) 5 MG tablet TAKE 1 TABLET BY MOUTH DAILY 90 tablet 1   amoxicillin (AMOXIL) 500 MG capsule Take 2,000 mg by mouth as directed. Prior to dental procedures     aspirin 81 MG tablet Take 81 mg by mouth daily.     benazepril  (LOTENSIN ) 20 MG tablet TAKE 1 TABLET BY MOUTH DAILY. TAKE IN PLACE OF QUINAPRIL . 90 tablet 2   cholecalciferol (VITAMIN D ) 1000 UNITS tablet Take 2,000 Units by mouth daily.      Coenzyme Q10 (CO Q-10) 200 MG CAPS Take 200 mg by mouth daily.     dorzolamide-timolol (COSOPT) 22.3-6.8 MG/ML ophthalmic solution      fluticasone  (FLONASE ) 50 MCG/ACT nasal spray USE TWO SPRAYS IN EACH NOSTRIL EACH DAY AS DIRECTED BY PHYSICIAN 16 g 5   hydrochlorothiazide  (HYDRODIURIL ) 25 MG tablet TAKE 1 TABLET BY MOUTH DAILY 90 tablet 4   latanoprost (XALATAN) 0.005 % ophthalmic solution Place 1 drop into both eyes at bedtime.     letrozole (FEMARA) 2.5 MG tablet Take 2.5 mg by mouth daily.  loratadine (CLARITIN) 10 MG tablet Take 10 mg by mouth daily as needed for allergies.     lovastatin  (MEVACOR ) 40 MG tablet TAKE 1 TABLET BY MOUTH AT BEDTIME 90 tablet 1   LUMIGAN 0.01 % SOLN Place 1 drop into both eyes at bedtime.      metoprolol  succinate (TOPROL -XL) 50 MG 24 hr tablet TAKE 1 TABLET BY MOUTH DAILY WITH ANY MEAL OR IMMEDIATELY FOLLOWING A MEAL 90 tablet 1   povidone-iodine  (BETADINE ) 10 % external solution Apply 1 Application topically as needed for wound care. 480 mL 0   timolol (TIMOPTIC) 0.5 % ophthalmic solution Apply to eye.     No current facility-administered medications for this visit.    Family History Family History  Problem Relation Age of Onset   Uterine cancer Mother    Heart attack Father    Healthy Brother    Cancer Son        colon, died  from sx   Breast cancer Neg Hx       Social History Social History   Tobacco Use   Smoking status: Never   Smokeless tobacco: Never  Vaping Use   Vaping status: Never Used  Substance Use Topics   Alcohol use: Yes    Alcohol/week: 1.0 standard drink of alcohol    Types: 1 Glasses of wine per week    Comment: Occasionally   Drug use: No    Physical Exam Blood pressure (!) 155/71, pulse 67, temperature 98.2 F (36.8 C), height 5' 2 (1.575 m), weight 110 lb (49.9 kg), SpO2 99%. Last Weight  Most recent update: 03/18/2024  2:19 PM    Weight  49.9 kg (110 lb)             CONSTITUTIONAL: Well developed, and nourished, appropriately responsive and aware without distress.  She presents today with her son [name]. EYES: Sclera non-icteric.   EARS, NOSE, MOUTH AND THROAT: The skin over the the right chin submental area and submandibular area is somewhat stretched and shiny, it is a bright pink hue.  There is an area of fluctuance and pointing for approximately 2 cm diameter just under the right chin.  She has an absent tooth around the 25 [phone] area, she has a lost crown of tooth #31 and no evidence of draining pus within the mouth, nor remarkable inflammation or malodor upon evaluating the inside of the mouth.  Oral mucosa is pink and moist.   Hearing is intact to voice.  NECK: Trachea is midline, and there is no jugular venous distension.  LYMPH NODES:  Lymph nodes in the neck are not appreciated. RESPIRATORY:   Normal respiratory effort without pathologic use of accessory muscles. CARDIOVASCULAR:  Well perfused.  GI: The abdomen is  soft, nontender, and non distended. MUSCULOSKELETAL:  Symmetrical muscle tone appreciated in all four extremities.    SKIN: Skin turgor is normal. No pathologic skin lesions appreciated.  NEUROLOGIC:  Motor and sensation appear grossly normal.  Cranial nerves are grossly without defect. PSYCH:  Alert and oriented to person, place and time. Affect is  appropriate for situation.  Data Reviewed I have personally reviewed what is currently available of the patient's imaging, recent labs and medical records.   Labs:     Latest Ref Rng & Units 06/28/2022   11:23 AM 06/27/2021   10:44 AM 06/21/2020    8:17 AM  CBC  WBC 3.4 - 10.8 x10E3/uL 5.9  6.0  5.6   Hemoglobin 11.1 - 15.9  g/dL 87.2  86.7  87.0   Hematocrit 34.0 - 46.6 % 38.9  38.7  38.1   Platelets 150 - 450 x10E3/uL 256  281  288       Latest Ref Rng & Units 07/02/2023   10:25 AM 06/28/2022   11:23 AM 06/27/2021   10:44 AM  CMP  Glucose 70 - 99 mg/dL 95  89  95   BUN 8 - 27 mg/dL 18  20  12    Creatinine 0.57 - 1.00 mg/dL 9.05  9.04  9.28   Sodium 134 - 144 mmol/L 137  138  134   Potassium 3.5 - 5.2 mmol/L 4.1  4.3  4.4   Chloride 96 - 106 mmol/L 100  97  96   CO2 20 - 29 mmol/L 24  25  25    Calcium 8.7 - 10.3 mg/dL 9.6  89.9  9.9   Total Protein 6.0 - 8.5 g/dL 6.3  6.7  6.7   Total Bilirubin 0.0 - 1.2 mg/dL 0.7  0.6  0.6   Alkaline Phos 44 - 121 IU/L 44  43  50   AST 0 - 40 IU/L 20  22  23    ALT 0 - 32 IU/L 13  11  12      Imaging: Radiological images personally reviewed:   Within last 24 hrs: No results found.  Assessment    Submental/right submandibular abscess, likely secondary to mandibular periodontal disease/possible infected implants.  Patient Active Problem List   Diagnosis Date Noted   Chronic midline low back pain without sciatica 02/28/2021   Osteoarthritis of both shoulders 02/28/2021   Glaucoma of both eyes 04/06/2018   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 (HCC) 04/03/2018   Abnormal chest x-ray 05/08/2015   Bloodgood disease 05/08/2015   Thoracic, thoracolumbar and lumbosacral intervertebral disc disorder 05/08/2015   Osteoporosis 04/07/2015   Vitamin D  deficiency 03/29/2015   Open-angle glaucoma 03/29/2015   Hypertension 03/29/2015   Seasonal allergies 03/29/2015   Osteoarthritis 03/29/2015   Fibrocystic breast  disease 03/29/2015   Compression fracture of L1 lumbar vertebra (HCC) 03/29/2015   History of hip fracture 03/29/2015   Hyperlipemia 02/28/2015    Plan    Incision and drainage of submental/right submandibular abscess.  Pre-operative Diagnosis: Submental/right submandibular abscess  Post-operative Diagnosis: same.    Surgeon: Honor Leghorn, M.D., FACS  Anesthesia: Local   Findings: Large cavitation in the submental submandibular area, copious volume of malodorous purulent drainage.  Exploration with sterile Q-tip as tolerated confirming no obvious undrained collection. Procedure well-tolerated.  The wound was not packed with an adequate 2 cm opening.  No drain placed.  Estimated Blood Loss: 20 mL         Specimens: Purulent drainage for culture and sensitivity.          Complications: none              Procedure Details  The patient was evaluated, the benefits, complications, treatment options, and expected outcomes were discussed with the patient. The risks of bleeding, infection, recurrence of symptoms, failure to resolve symptoms, unanticipated injury, any of which could require further surgery were reviewed with the patient. The likelihood of improving the patient's symptoms with return to their baseline status is expected.  The patient and/or family concurred with the proposed plan, giving informed consent.  The patient was taken to our procedure room, identified and the procedure verified.    The patient was positioned in the supine position and  the the right mandible and chin area and upper neck was prepped with Betadine  and Chloraprep and draped in the sterile fashion.  A Time Out was held and the above information confirmed.  Local infiltration with a mixture 1% lidocaine with epinephrine and bicarbonate in a 5:1 ratio is utilized for adequate local anesthesia.  Incision is made with an 11 blade excising the attenuated/compromised dermis over the pointing area of the  abscess.  Careful exploration of the cavity completed after obtaining culture.  This extended across midline in the submental area, down the anterior neck in the midline, and within the patient's right submandibular area.  She tolerated this procedure well I was able to carefully milk out further drainage with little to no application of pressure.  She tolerated this well, and I applied an absorptive dressing.    I spoke to an ER physician who took my call at [facility], unfortunately did not write down the name but created a notice that they would expect her arrival to complete her workup with CT scan of the head and neck as was originally ordered by Jenna Oswalt, PA-C.  I believe it is best we know these results sooner than later, I believe as they suggested a dose of IV antibiotics would be well worthwhile, and while awaiting return call from [facility] oral maxillofacial service will determine what kind of follow-up she needs versus inpatient admission. Since I sent her to the ED, the [facility] oral maxillofacial services called me and I referred them to contact the [facility] ED to ensure that she has follow-up with the appropriate physicians.   I personally spent a total of 75 minutes in the care of the patient today including preparing to see the patient, getting/reviewing separately obtained history, performing a medically appropriate exam/evaluation, counseling and educating, placing orders, referring and communicating with other health care professionals, documenting clinical information in the EHR, communicating results, and coordinating care.   These notes generated with voice recognition software. I apologize for typographical errors.  Honor Leghorn M.D., FACS 03/18/2024, 3:36 PM

## 2024-03-18 NOTE — Progress Notes (Signed)
 Established patient visit  Patient: Anna Johnson   DOB: 22-Dec-1940   83 y.o. Female  MRN: [id number] Visit Date: 03/18/2024  Today's healthcare provider: Jolynn Spencer, PA-C   Chief Complaint  Patient presents with   Facial Injury    Pt bump chin on car door 1 week ago. R chin swelling, bump appeared today. Denies warm to the touch, pain. Pt needs tetanus vaccine.   Subjective     HPI     Facial Injury    Additional comments: Pt bump chin on car door 1 week ago. R chin swelling, bump appeared today. Denies warm to the touch, pain. Pt needs tetanus vaccine.      Last edited by Wilfred Hargis RAMAN, CMA on 03/18/2024  1:12 PM.       Discussed the use of AI scribe software for clinical note transcription with the patient, who gave verbal consent to proceed.  History of Present Illness Anna Johnson is an 83 year old female who presents with swelling and pus formation on her chin following minor trauma. She is accompanied by her son, [name].  A week ago, she bumped her chin with a car door. Initially asymptomatic, she developed swelling two days later, which has progressively worsened. Today, pus formation is noted on the side of her chin. She experiences discomfort but no significant pain. She denies chest pain, shortness of breath, vision problems, weakness, or tingling sensations. She can chew, eat, swallow, and breathe without difficulty.  Her medical history includes osteoporosis, managed with alendronate . She is not on blood thinners and has discontinued baby aspirin. She has no known allergies.       03/18/2024    1:21 PM 12/12/2023    1:37 PM 09/19/2023    1:30 PM  Depression screen PHQ 2/9  Decreased Interest 1 1 0  Down, Depressed, Hopeless 0 0 0  PHQ - 2 Score 1 1 0  Altered sleeping 1    Tired, decreased energy 0    Change in appetite 0    Feeling bad or failure about yourself  0    Trouble concentrating 0    Moving slowly or fidgety/restless 0    Suicidal thoughts 0     PHQ-9 Score 2    Difficult doing work/chores Somewhat difficult        03/18/2024    1:21 PM 12/12/2023    1:37 PM 09/19/2023    1:30 PM 07/02/2023    9:53 AM  GAD 7 : Generalized Anxiety Score  Nervous, Anxious, on Edge 0 0 0 0  Control/stop worrying 0 0 0 0  Worry too much - different things 0 0 0 0  Trouble relaxing 0 0 0 0  Restless 0 0 0 0  Easily annoyed or irritable 0 0 0 0  Afraid - awful might happen 0 0 0 0  Total GAD 7 Score 0 0 0 0  Anxiety Difficulty Not difficult at all Not difficult at all Not difficult at all Not difficult at all    Medications: Outpatient Medications Prior to Visit  Medication Sig   Acetaminophen 500 MG capsule Take 2 capsules by mouth as needed for fever.   alendronate  (FOSAMAX ) 70 MG tablet TAKE 1 TABLET BY MOUTH WEEKLY. TAKE IN THE MORNING WITH 8 OZ OF WATER. DO NOT EAT, DRINK OR LIE DOWN FOR 30 MIN AFTER.   amLODipine  (NORVASC ) 5 MG tablet TAKE 1 TABLET BY MOUTH DAILY   amoxicillin (AMOXIL) 500 MG capsule Take  2,000 mg by mouth as directed. Prior to dental procedures   aspirin 81 MG tablet Take 81 mg by mouth daily.   benazepril  (LOTENSIN ) 20 MG tablet TAKE 1 TABLET BY MOUTH DAILY. TAKE IN PLACE OF QUINAPRIL .   cholecalciferol (VITAMIN D ) 1000 UNITS tablet Take 2,000 Units by mouth daily.    Coenzyme Q10 (CO Q-10) 200 MG CAPS Take 200 mg by mouth daily.   dorzolamide-timolol (COSOPT) 22.3-6.8 MG/ML ophthalmic solution    fluticasone  (FLONASE ) 50 MCG/ACT nasal spray USE TWO SPRAYS IN EACH NOSTRIL EACH DAY AS DIRECTED BY PHYSICIAN   hydrochlorothiazide  (HYDRODIURIL ) 25 MG tablet TAKE 1 TABLET BY MOUTH DAILY   latanoprost (XALATAN) 0.005 % ophthalmic solution Place 1 drop into both eyes at bedtime.   letrozole (FEMARA) 2.5 MG tablet Take 2.5 mg by mouth daily.   loratadine (CLARITIN) 10 MG tablet Take 10 mg by mouth daily as needed for allergies.   lovastatin  (MEVACOR ) 40 MG tablet TAKE 1 TABLET BY MOUTH AT BEDTIME   LUMIGAN 0.01 % SOLN Place 1  drop into both eyes at bedtime.    metoprolol  succinate (TOPROL -XL) 50 MG 24 hr tablet TAKE 1 TABLET BY MOUTH DAILY WITH ANY MEAL OR IMMEDIATELY FOLLOWING A MEAL   povidone-iodine  (BETADINE ) 10 % external solution Apply 1 Application topically as needed for wound care.   timolol (TIMOPTIC) 0.5 % ophthalmic solution Apply to eye.   No facility-administered medications prior to visit.    Review of Systems All negative Except see HPI       Objective    BP (!) 129/58 (BP Location: Left Arm, Patient Position: Sitting, Cuff Size: Normal)   Pulse 66   Resp 14   Ht 5' 2 (1.575 m)   Wt 110 lb 1.6 oz (49.9 kg)   SpO2 100%   BMI 20.14 kg/m     Physical Exam Vitals reviewed.  Constitutional:      General: She is not in acute distress.    Appearance: Normal appearance. She is well-developed. She is not diaphoretic.  HENT:     Head: Normocephalic and atraumatic.  Eyes:     General: No scleral icterus.    Conjunctiva/sclera: Conjunctivae normal.  Neck:     Thyroid : No thyromegaly.  Cardiovascular:     Rate and Rhythm: Normal rate and regular rhythm.     Pulses: Normal pulses.     Heart sounds: Normal heart sounds. No murmur heard. Pulmonary:     Effort: Pulmonary effort is normal. No respiratory distress.     Breath sounds: Normal breath sounds. No wheezing, rhonchi or rales.  Musculoskeletal:     Cervical back: Neck supple.     Right lower leg: No edema.     Left lower leg: No edema.  Lymphadenopathy:     Cervical: No cervical adenopathy.  Skin:    General: Skin is warm and dry.     Findings: Lesion (photo attached/media) present. No rash.  Neurological:     Mental Status: She is alert and oriented to person, place, and time. Mental status is at baseline.  Psychiatric:        Mood and Affect: Mood normal.        Behavior: Behavior normal.      No results found for any visits on 03/18/24.      Assessment & Plan Facial soft tissue infection with abscess after  trauma Infection localized to facial soft tissue with abscess formation after minor trauma. - Order CT scan of the  facial area to assess infection extent and rule out complications. - Refer urgently to surgery for evaluation and possible abscess drainage. - Consider Prescribing antibiotics to prevent infection spread.  Osteoporosis Chronic Osteoporosis managed with alendronate . Will follow-up  Communicated with [facility] who is kindly agreed to see the pat today.   Orders Placed This Encounter  Procedures   CT MAXILLOFACIAL WO CONTRAST    Preferred imaging location?:   [facility]   Ambulatory referral to General Surgery    Referral Priority:   Urgent    Referral Type:   Surgical    Referral Reason:   Specialty Services Required    Requested Specialty:   General Surgery    Number of Visits Requested:   1    No follow-ups on file.   The patient was advised to call back or seek an in-person evaluation if the symptoms worsen or if the condition fails to improve as anticipated.  I discussed the assessment and treatment plan with the patient. The patient was provided an opportunity to ask questions and all were answered. The patient agreed with the plan and demonstrated an understanding of the instructions.  I, Qaadir Kent, PA-C have reviewed all documentation for this visit. The documentation on 03/18/2024  for the exam, diagnosis, procedures, and orders are all accurate and complete.  Jolynn Spencer, [facility], MMS [facility] [phone] (phone) [phone] (fax)  [facility]

## 2024-03-18 NOTE — ED Triage Notes (Addendum)
 Pt to ED via POV from home. Pt sent by PCP. Pt reports hit her chin a week ago and has dental implants. Pt reports pain and swelling to right side of chin and has abscess. Not currently on antibiotics

## 2024-03-18 NOTE — Patient Instructions (Addendum)
 Go to the ED now for further assessment of this area. We will follow up based on their findings.   Today we have drained your Abscess in the office. The numbing medication will wear off in approximately 4-8 hours. You will have some pain to the area afterwards but should not be as severe as prior to the procedure.  If you have been given antibiotics, please continue to take them after your procedure.  You may take 2 extra strength Tylenol, or 3 regular Ibuprofen tablets every 6 hours as needed for pain and discomfort.  You may shower. First remove all of the packing and wash letting the warm soapy water run over the area, rinse well, and pat dry.   You will need to change the dressing on the area everyday. For showering, first remove the dressing. Shower as normal letting the warm soapy water run over the area. Rinse well, pat dry, and repack the   We will see you back as scheduled below.   If you have any questions or concerns prior to your appointment, please call our office and speak with a nurse.  Incision and Drainage Incision and drainage is a surgical procedure to open and drain a fluid-filled sac. The sac may be filled with pus, mucus, or blood. Examples of fluid-filled sacs that may need surgical drainage include cysts, skin infections (abscesses), and red lumps that develop from a ruptured cyst or a small abscess (boils). You may need this procedure if the affected area is large, painful, infected, or not healing well. Tell a health care provider about: Any allergies you have. All medicines you are taking, including vitamins, herbs, eye drops, creams, and over-the-counter medicines. Any problems you or family members have had with anesthetic medicines. Any blood disorders you have. Any surgeries you have had. Any medical conditions you have. Whether you are pregnant or may be pregnant. What are the risks? Generally, this is a safe procedure. However, problems may occur,  including: Infection. Bleeding. Allergic reactions to medicines. Scarring.  What happens before the procedure? You may need an ultrasound or other imaging tests to see how large or deep the fluid-filled sac is. You may have blood tests to check for infection. You may get a tetanus shot. You may be given antibiotic medicine to help prevent infection. Follow instructions from your health care provider about eating or drinking restrictions. Ask your health care provider about: Changing or stopping your regular medicines. This is especially important if you are taking diabetes medicines or blood thinners. Taking medicines such as aspirin and ibuprofen. These medicines can thin your blood. Do not take these medicines before your procedure if your health care provider instructs you not to. Plan to have someone take you home after the procedure. If you will be going home right after the procedure, plan to have someone stay with you for 24 hours. What happens during the procedure? To reduce your risk of infection: Your health care team will wash or sanitize their hands. Your skin will be washed with soap. You will be given one or more of the following: A medicine to help you relax (sedative). A medicine to numb the area (local anesthetic). A medicine to make you fall asleep (general anesthetic). An incision will be made in the top of the fluid-filled sac. The contents of the sac may be squeezed out, or a syringe or tube (catheter) may be used to empty the sac. The catheter may be left in place for several weeks  to drain any fluid. Or, your health care provider may stitch open the edges of the incision to make a long-term opening for drainage (marsupialization). The inside of the sac may be washed out (irrigated) with a sterile solution and packed with gauze before it is covered with a bandage (dressing). The procedure may vary among health care providers and hospitals. What happens after the  procedure? Your blood pressure, heart rate, breathing rate, and blood oxygen level will be monitored often until the medicines you were given have worn off. Do not drive for 24 hours if you received a sedative. This information is not intended to replace advice given to you by your health care provider. Make sure you discuss any questions you have with your health care provider. Document Released: 01/22/2001 Document Revised: 01/04/2016 Document Reviewed: 05/19/2015 Elsevier Interactive Patient Education  2017 [employer]   Incision and Drainage, Care After Refer to this sheet in the next few weeks. These instructions provide you with information about caring for yourself after your procedure. Your health care provider may also give you more specific instructions. Your treatment has been planned according to current medical practices, but problems sometimes occur. Call your health care provider if you have any problems or questions after your procedure. What can I expect after the procedure? After the procedure, it is common to have: Pain or discomfort around your incision site. Drainage from your incision.  Follow these instructions at home: Take over-the-counter and prescription medicines only as told by your health care provider. If you were prescribed an antibiotic medicine, take it as told by your health care provider. Do not stop taking the antibiotic even if you start to feel better. Follow instructions from your health care provider about: How to take care of your incision. When and how you should change your packing and bandage (dressing). Wash your hands with soap and water before you change your dressing. If soap and water are not available, use hand sanitizer. When you should remove your dressing. Do not take baths, swim, or use a hot tub until your health care provider approves. Keep all follow-up visits as told by your health care provider. This is important. Check your  incision area every day for signs of infection. Check for: More redness, swelling, or pain. More fluid or blood. Warmth. Pus or a bad smell. Contact a health care provider if: Your cyst or abscess returns. You have a fever. You have more redness, swelling, or pain around your incision. You have more fluid or blood coming from your incision. Your incision feels warm to the touch. You have pus or a bad smell coming from your incision. Get help right away if: You have severe pain or bleeding. You cannot eat or drink without vomiting. You have decreased urine output. You become short of breath. You have chest pain. You cough up blood. The area where the incision and drainage occurred becomes numb or it tingles. This information is not intended to replace advice given to you by your health care provider. Make sure you discuss any questions you have with your health care provider. Document Released: 10/21/2011 Document Revised: 12/29/2015 Document Reviewed: 05/19/2015 Elsevier Interactive Patient Education  2017 [employer].

## 2024-03-18 NOTE — Consult Note (Incomplete)
 Pharmacy Antibiotic Note  ASSESSMENT: 83 y.o. female with PMH facial abscess, breast cancer, HTN, glaucoma is presenting with cellulitis / soft tissue infection in setting of purulent right mandibular infection . Patient endorses having a dental abscess but she is not currently on antibiotics. Maxillofacial CT has been ordered. Pharmacy has been consulted to manage vancomycin  dosing.  PLAN: Administer vancomycin  1250mg  IV x 1 as a load, followed by 750mg  IV q24H thereafter eAUC 540, Cmax 34.5, Cmin 14.2 Scr 0.8, TBW, Vd 0.72 L/kg Follow up culture results to assess for antibiotic optimization. Monitor renal function to assess for any necessary antibiotic dosing changes.  Patient measurements:    Vital signs: Temp: 98.1 F (36.7 C) (08/07 1604) Temp Source: Oral (08/07 1604) BP: 141/72 (08/07 1604) Pulse Rate: 74 (08/07 1604) Recent Labs  Lab 03/18/24 1610  WBC 12.4*  CREATININE 0.62   Estimated Creatinine Clearance: 41.2 mL/min (by C-G formula based on SCr of 0.62 mg/dL).  Allergies: Allergies  Allergen Reactions   Codeine Other (See Comments)   Seasonal Ic [Cholestatin]    Covera-Hs [Verapamil] Rash    Antimicrobials this admission: *** *** >> ***  Dose adjustments this admission: *** *** >> ***  Microbiology results: {CULTURE RESULTS:29681}  Thank you for allowing pharmacy to be a part of this patient's care.  Will M. Lenon, PharmD Clinical Pharmacist 03/18/2024 5:26 PM

## 2024-03-18 NOTE — Discharge Instructions (Signed)
 Please use ibuprofen (Motrin) up to 800 mg every 8 hours, naproxen (Naprosyn) up to 500 mg every 12 hours, and/or acetaminophen (Tylenol) up to 4 g/day for any continued pain.  Please do not use this medication regimen for longer than 7 days

## 2024-03-18 NOTE — Consult Note (Signed)
 PHARMACY - BRIEF ANTIBIOTIC NOTE   Pharmacy has received consult(s) for vancomycin  from an ED provider. The patient's profile has been reviewed for ht/wt/allergies/indication/available labs.    One time order(s) placed for: vancomycin  1250mg  IV x 1  Further antibiotics/pharmacy consults should be ordered by admitting physician if indicated.                       Thank you,  Will M. Lenon, PharmD Clinical Pharmacist 03/18/2024 5:48 PM

## 2024-03-18 NOTE — Therapy (Signed)
 OUTPATIENT PHYSICAL THERAPY TREATMENT    Patient Name: Anna Johnson MRN: [id number] DOB:[birth year], 83 y.o., female Today's Date: 03/18/2024  END OF SESSION:  PT End of Session - 03/18/24 1036     Visit Number 16    Number of Visits 31    Date for PT Re-Evaluation 04/09/24    PT Start Time 1036    PT Stop Time 1115    PT Time Calculation (min) 39 min    Equipment Utilized During Treatment Gait belt    Activity Tolerance Patient tolerated treatment well    Behavior During Therapy WFL for tasks assessed/performed                        Past Medical History:  Diagnosis Date   Allergy    Glaucoma    Hyperlipidemia    Hypertension    Osteoporosis    Pneumonia 03/29/2015   Past Surgical History:  Procedure Laterality Date   BREAST BIOPSY Left 03/16/2018   Affirm Biopsy- Ribbon clip- positive   BREAST LUMPECTOMY Left 04/2018   CATARACT EXTRACTION  2004   DILATION AND CURETTAGE OF UTERUS     HIP FRACTURE SURGERY  09/15/2010   left hip   WRIST FRACTURE SURGERY     ORIF   Patient Active Problem List   Diagnosis Date Noted   Chronic midline low back pain without sciatica 02/28/2021   Osteoarthritis of both shoulders 02/28/2021   Glaucoma of both eyes 04/06/2018   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 (HCC) 04/03/2018   Abnormal chest x-ray 05/08/2015   Bloodgood disease 05/08/2015   Thoracic, thoracolumbar and lumbosacral intervertebral disc disorder 05/08/2015   Osteoporosis 04/07/2015   Vitamin D  deficiency 03/29/2015   Open-angle glaucoma 03/29/2015   Hypertension 03/29/2015   Seasonal allergies 03/29/2015   Osteoarthritis 03/29/2015   Fibrocystic breast disease 03/29/2015   Compression fracture of L1 lumbar vertebra (HCC) 03/29/2015   History of hip fracture 03/29/2015   Hyperlipemia 02/28/2015    PCP: Gasper Nancyann BRAVO, MD   REFERRING PROVIDER: Gasper Nancyann BRAVO, MD   REFERRING DIAG: R26.9 (ICD-10-CM) - Gait  disturbance  Rationale for Evaluation and Treatment: Rehabilitation  THERAPY DIAG:  Unsteadiness on feet  Difficulty in walking, not elsewhere classified  Muscle weakness (generalized)  History of falling  ONSET DATE: September 17, 2023 (pt fell backwards reaching for bed post)  SUBJECTIVE:  SUBJECTIVE STATEMENT: Going to see her doctor today about the wound on her R chin. Thinks her walking is getting a little better.      PERTINENT HISTORY:  Gait disturbance. Pt got up in the middle of the night to go to the bathroom. Pt reached towards her bed but missed and feel onto her back, pt states she reached too early. Felt fine afterwards except 2 days with B LE discomfort. LE is now fine but L LE just feels awkward. Has a hx of L hip fx S/P surgery 15 years ago. Pt states not having pain or anything but feels like she needs exercise.    Blood pressure is controlled per pt. No latex allergies     PAIN:  None   PRECAUTIONS: Fall  RED FLAGS: No known red flags   WEIGHT BEARING RESTRICTIONS: No  FALLS:  Has patient fallen in last 6 months? Yes. Number of falls 1 first week in September 16, 2023.   LIVING ENVIRONMENT: Lives with: lives with their spouse Lives in: House/apartment Stairs: Yes: Internal: 15 steps; on right going up and External: 5 steps; bilateral but cannot reach both Has following equipment at home: Single point cane, Walker - 4 wheeled, and shower chair  OCCUPATION: retired  PLOF: Independent with household mobility with device  PATIENT GOALS: improve balance, improve ability to ambulate safely.   NEXT MD VISIT:   OBJECTIVE:  Note: Objective measures were completed at Evaluation unless otherwise noted.  DIAGNOSTIC FINDINGS:  DG Ribs Unilateral Right  09/19/2023  Narrative & Impression  CLINICAL DATA:  Post fall, now with right-sided rib pain.   EXAM: RIGHT RIBS - 2 VIEW   COMPARISON:  Chest radiograph-09/18/2012   FINDINGS: Unchanged cardiac silhouette and mediastinal contours with atherosclerotic plaque within the thoracic aorta. No focal parenchymal opacities. No pleural effusion or pneumothorax. No evidence of edema.   No definite displaced right-sided rib fractures. Regional soft tissues appear normal. Surgical clips overlie the left breast.   IMPRESSION: 1. No definite displaced right-sided rib fractures. 2. No acute cardiopulmonary disease. 3. Aortic Atherosclerosis (ICD10-I70.0).     Electronically Signed   By: Norleen Roulette M.D.   On: 09/21/2023 16:40      PATIENT SURVEYS:  ABC scale: 36.25 % (12/18/2023)  COGNITION: Overall cognitive status: Within functional limits for tasks assessed     SENSATION:   MUSCLE LENGTH:   POSTURE: forward flexed, decreased B knee extension  PALPATION:   LUMBAR ROM:   AROM eval  Flexion   Extension   Right lateral flexion   Left lateral flexion   Right rotation   Left rotation    (Blank rows = not tested)  LOWER EXTREMITY ROM:     Passive  Right eval Left eval  Hip flexion    Hip extension    Hip abduction    Hip adduction    Hip internal rotation    Hip external rotation    Knee flexion    Knee extension    Ankle dorsiflexion    Ankle plantarflexion    Ankle inversion    Ankle eversion     (Blank rows = not tested)  LOWER EXTREMITY MMT:    MMT Right eval Left eval  Hip flexion 4 4 at available range with L lateral thigh pulling   Hip extension (seated manually resisted) 4 3  Hip abduction (seated manually resisted) 4 4-  Hip adduction    Hip internal rotation    Hip external rotation  Knee flexion 5 4  Knee extension 5 5  Ankle dorsiflexion    Ankle plantarflexion    Ankle inversion    Ankle eversion     (Blank rows = not  tested)  LUMBAR SPECIAL TESTS:    FUNCTIONAL TESTS:  DGI: 7/24  GAIT: Distance walked: 40 ft Assistive device utilized: Single point cane Level of assistance: CGA Comments: antalgic, SPC on R hand, decreased stance L LE, decreased B knee extension, decreased step length, shuffling gait pattern whenever starting back up to walk after static position.    Pt tendency to reach too early for chair to sit, causing her center of gravity to leave her base of support.  Cues needed to walk to the chair and not to reach for the chair too early.   Cues for B femoral control to sit with proper hand placement.   Ascending stairs: R rail assist, reciprocal pattern, decreased B femoral control  Descaneind stairs: R rail assist, pt steps down with L LE first. 2 feet to a step, decreased B femoral control .    TREATMENT DATE: 03/18/2024                                                                                                                                Gait training  Performed to improve ability to ambulate with better foot clearance and step length.   NuStep seat 5, arms 5, level 1 for 5 minutes to promote reciprocal, alternating movement  Cues to maintain at least 70 SPM  Stepping over 4 mini hurdles with SPC and CGA 6x   Cues for getting close to obstacle and improving hip and knee flexion and step length for foot clearance.   Standing with B UE assist   Marches 10x3 each LE    Cues for large movement   Hip abduction 10x3 each LE   Cues for large movement  Stepping over 4 mini hurdles with SPC and CGA 6x   Cues for getting close to obstacle and improving hip and knee flexion and step length for foot clearance.   Pt was recommended to get checked out by a neurologist secondary to consistent shuffling gait pattern. Pt and son verbalized understanding.  Standing static large step forward and large step back (to improve step length and foot clearance)  L 10x  R  10x   Therapeutic rest breaks secondary to fatigue.  Improved exercise technique, movement at target joints, use of target muscles after mod verbal, visual, tactile cues.          PATIENT EDUCATION:  Education details: there-ex, HEP Person educated: Patient Education method: Explanation, Demonstration, Tactile cues, Verbal cues, and Handouts Education comprehension: verbalized understanding, returned demonstration, verbal cues required, tactile cues required, and needs further education  HOME EXERCISE PROGRAM:  Seated hip flexion with opposite shoulder flexion 10x3 each LE to promote coodination and reciprocal movement  Picture provided  Gait training with rw/SPC  with son, with emphasis on hip and knee flexion, increase base of support and step length.   Pt and son Salbador) verbalized understanding   Access Code: [email or web address] URL: [email or web address] Date: 03/18/2024 Prepared by: Emil Glassman  Exercises - Sit to Stand with Counter Support  - 3 x daily - 7 x weekly - 1 sets - 5 reps - Seated Long Arc Quad  - 3 x daily - 7 x weekly - 3 sets - 10 reps - 10 seconds hold - Standing March with Counter Support  - 1 x daily - 7 x weekly - 3 sets - 10 reps - Standing Hip Abduction with Counter Support  - 1 x daily - 7 x weekly - 3 sets - 10 reps    ASSESSMENT:  CLINICAL IMPRESSION: Continued working on improving hip and knee flexion and step length to promote clearance for obstacle negotiation. Pt was recommended to get checked out by a neurologist secondary to consistent shuffling gait pattern which adversely affects obstacle negotiation and foot clearance, which increases risk for falls. Pt tolerated session well without aggravation of symptoms. Pt will benefit from continued skilled physical therapy services to improve strength, balance, function, and gait.     OBJECTIVE IMPAIRMENTS: Abnormal gait, decreased balance, difficulty walking, decreased strength,  improper body mechanics, and postural dysfunction.   ACTIVITY LIMITATIONS: carrying, lifting, standing, squatting, stairs, and locomotion level  PARTICIPATION LIMITATIONS:   PERSONAL FACTORS: Age, Fitness, Past/current experiences, Time since onset of injury/illness/exacerbation, and 1-2 comorbidities: HTN, osteoporosis are also affecting patient's functional outcome.   REHAB POTENTIAL: Fair    CLINICAL DECISION MAKING: Stable/uncomplicated  EVALUATION COMPLEXITY: Low   GOALS: Goals reviewed with patient? Yes  SHORT TERM GOALS: Target date: 01/02/2024  Pt will be independent with her initial HEP to improve strength, femoral control, balance, and function.  Baseline: Pt has not yet started her initial HEP (12/18/2023); Doing her HEP, no questions (02/06/2024) Goal status: MET   LONG TERM GOALS: Target date: 04/09/2024  Pt will improve her Activities-specific Balance Confidence (ABC) Scale by at least 20% as a demonstration of improved balance.  Baseline: ABC scale sore: 36.25%, 36.25% (02/20/2024) Goal status: ONGOING  2.  Pt will improve her DGI score to 15/24 or more as a demonstration of improved balance.  Baseline: DGI score: 7/24 (12/18/2023); 6/24 (02/06/2024); 9/24 (02/20/2024) Goal status: ONGOING  3.  Pt will improve her B hip flexion, extension and abduction strength by at least 1/2 MMT to improve ability to ambulate, perform standing tasks more safely and with less difficulty.   Baseline:  MMT Right eval Left eval R (02/06/2024) L (02/06/2024) R  (02/20/2024) L (02/20/2024)  Hip flexion 4 4 at available range with L lateral thigh pulling  4+ 4+ 4+ 5  Hip extension (seated manually resisted) 4 3 4 4 4  4+  Hip abduction (seated manually resisted) 4 4- 4+ 4 5 5    (12/18/2023)  Goal status: PROGRESSING (all muscles improved except R hip extension)    PLAN:  PT FREQUENCY: 1-2x/week  PT DURATION: other: 7 weeks  PLANNED INTERVENTIONS: 97110-Therapeutic exercises,  97530- Therapeutic activity, [id number]- Neuromuscular re-education, 97535- Self Care, [id number]- Manual therapy, [id number]- Gait training, [phone]- Canalith repositioning, [id number]- Electrical stimulation (unattended), Patient/Family education, Balance training, Stair training, and Vestibular training.  PLAN FOR NEXT SESSION: placing and maintaining center of gravity over her base of support, gait training, trunk and LE strengthening, proper mechanics, manual techniques, modalities PRN.    Tahja Liao, PT, DPT 03/18/2024, 12:33 PM

## 2024-03-18 NOTE — ED Notes (Signed)
 Pt does use assistive device and is a fall risk. Pt requesting to sit on side of bed. Son at bedside. Pt educated on importance of fall risk bundle. Pt declining bed alarm at this time. Pt educated to use call light to call for help to ambulate.

## 2024-03-18 NOTE — ED Provider Notes (Signed)
 [facility] Provider Note   Event Date/Time   First MD Initiated Contact with Patient 03/18/24 1636     (approximate) History  Abscess  HPI Anna Johnson is a 83 y.o. female with a recent past medical history of dental surgery 3 days prior to arrival who presents complaining of a mass with purulent drainage to the right aspect of the mandible.  Patient states that this originally started after getting hit with a car door on the side of her jaw with no apparent trauma at the time.  Patient states that days later she began having swelling, redness, but no pain over this area.  Patient was seen in clinic today where she states this wound was opened and has been draining purulent and bloody drainage since then.  Patient was not started on any antibiotics at that time.  Patient denies any oral/dental pain or significant pain with eating/chewing. ROS: Patient currently denies any vision changes, tinnitus, difficulty speaking, facial droop, sore throat, chest pain, shortness of breath, abdominal pain, nausea/vomiting/diarrhea, dysuria, or weakness/numbness/paresthesias in any extremity   Physical Exam  Triage Vital Signs: ED Triage Vitals [03/18/24 1604]  Encounter Vitals Group     BP (!) 141/72     Girls Systolic BP Percentile      Girls Diastolic BP Percentile      Boys Systolic BP Percentile      Boys Diastolic BP Percentile      Pulse Rate 74     Resp 16     Temp 98.1 F (36.7 C)     Temp Source Oral     SpO2 98 %     Weight      Height      Head Circumference      Peak Flow      Pain Score 7     Pain Loc      Pain Education      Exclude from Growth Chart    Most recent vital signs: Vitals:   03/18/24 1800 03/18/24 1920  BP: (!) 152/75 108/87  Pulse: 84 (!) 54  Resp:  14  Temp:    SpO2: 100% 100%   General: Awake, oriented x4. CV:  Good peripheral perfusion. Resp:  Normal effort. Abd:  No distention. Other:  Elderly well-developed,  well-nourished Caucasian female resting comfortably in no acute distress.  Significant erythema and swelling in approximately 4 cm diameter with central area of purulent/bloody drainage ED Results / Procedures / Treatments  Labs (all labs ordered are listed, but only abnormal results are displayed) Labs Reviewed  CBC - Abnormal; Notable for the following components:      Result Value   WBC 12.4 (*)    RBC 3.84 (*)    All other components within normal limits  BASIC METABOLIC PANEL WITH GFR - Abnormal; Notable for the following components:   Sodium 132 (*)    Chloride 92 (*)    Glucose, Bld 101 (*)    All other components within normal limits  LACTIC ACID, PLASMA   RADIOLOGY ED MD interpretation: CT of the maxillofacial structures with IV contrast shows findings compatible with a 6.2 x 3.9 x 4.7 cm abscess centered inferior to the right mandible which extends to the skin surface - All radiology independently interpreted and agree with radiology assessment Official radiology report(s): CT Maxillofacial W Contrast Result Date: 03/18/2024 CLINICAL DATA:  right mandibular abscess EXAM: CT MAXILLOFACIAL WITH CONTRAST TECHNIQUE: Multidetector CT imaging of the maxillofacial  structures was performed with intravenous contrast. Multiplanar CT image reconstructions were also generated. RADIATION DOSE REDUCTION: This exam was performed according to the departmental dose-optimization program which includes automated exposure control, adjustment of the mA and/or kV according to patient size and/or use of iterative reconstruction technique. CONTRAST:  OMNIPAQUE  IOHEXOL  300 MG/ML  SOLN COMPARISON:  None Available. FINDINGS: Osseous: No evidence of acute fracture.  TMJs are located. Orbits: Negative. No traumatic or inflammatory finding. Sinuses: Clear. Soft tissues: Large (6.2 x 3.9 by 4.7 cm) peripherally enhancing complex fluid collection centered inferior to the anterior right mandible, extending to  the skin surface. Surrounding edema is compatible with cellulitis. The collection abuts the anterior right mandible inferiorly with multiple missing teeth and implants Limited intracranial: No significant or unexpected finding. IMPRESSION: Findings compatible with large (6.2 x 3.9 by 4.7 cm) abscess centered inferior to the right mandible which extends to the skin surface. Electronically Signed   By: Gilmore GORMAN Molt M.D.   On: 03/18/2024 20:26   PROCEDURES: Critical Care performed: No Procedures MEDICATIONS ORDERED IN ED: Medications  cefTRIAXone  (ROCEPHIN ) 2 g in sodium chloride  0.9 % 100 mL IVPB (0 g Intravenous Stopped 03/18/24 1813)  vancomycin  (VANCOREADY) IVPB 1250 mg/250 mL (1,250 mg Intravenous New Bag/Given 03/18/24 1823)  iohexol  (OMNIPAQUE ) 300 MG/ML solution 100 mL (100 mLs Intravenous Contrast Given 03/18/24 1849)   IMPRESSION / MDM / ASSESSMENT AND PLAN / ED COURSE  I reviewed the triage vital signs and the nursing notes.                             The patient is on the cardiac monitor to evaluate for evidence of arrhythmia and/or significant heart rate changes. Patient's presentation is most consistent with acute presentation with potential threat to life or bodily function. Presentation most consistent with simple Abscess. Given History, Exam, and Workup I have low suspicion for Cellulitis, Necrotizing Fasciitis, Pyomyositis, Sporotrichosis, Osteomyelitis or other emergent problem as a cause for this presentation. Interventions:none necessary as wound is already open and draining Rx: Doxycycline  100 mg BID x 10 days Disposition: Patient is stable for discharge, advised to follow up with primary care physician in 48 hours.   FINAL CLINICAL IMPRESSION(S) / ED DIAGNOSES   Final diagnoses:  Mandibular abscess   Rx / DC Orders   ED Discharge Orders          Ordered    doxycycline  (VIBRA -TABS) 100 MG tablet  2 times daily        03/18/24 2042           Note:  This  document was prepared using Dragon voice recognition software and may include unintentional dictation errors.   Jossie Artist POUR, MD 03/18/24 2056

## 2024-03-23 ENCOUNTER — Ambulatory Visit

## 2024-03-25 ENCOUNTER — Ambulatory Visit

## 2024-03-25 DIAGNOSIS — Z9181 History of falling: Secondary | ICD-10-CM

## 2024-03-25 DIAGNOSIS — R262 Difficulty in walking, not elsewhere classified: Secondary | ICD-10-CM

## 2024-03-25 DIAGNOSIS — R2681 Unsteadiness on feet: Secondary | ICD-10-CM | POA: Diagnosis not present

## 2024-03-25 DIAGNOSIS — M6281 Muscle weakness (generalized): Secondary | ICD-10-CM | POA: Diagnosis not present

## 2024-03-25 NOTE — Therapy (Signed)
 OUTPATIENT PHYSICAL THERAPY TREATMENT    Patient Name: Anna Johnson MRN: [id number] DOB:October 22, 1940, 83 y.o., female Today's Date: 03/25/2024  END OF SESSION:  PT End of Session - 03/25/24 1033     Visit Number 17    Number of Visits 31    Date for PT Re-Evaluation 04/09/24    PT Start Time 1034    PT Stop Time 1117    PT Time Calculation (min) 43 min    Equipment Utilized During Treatment Gait belt    Activity Tolerance Patient tolerated treatment well    Behavior During Therapy [facility] for tasks assessed/performed                         Past Medical History:  Diagnosis Date   Allergy    Glaucoma    Hyperlipidemia    Hypertension    Osteoporosis    Pneumonia 03/29/2015   Past Surgical History:  Procedure Laterality Date   BREAST BIOPSY Left 03/16/2018   Affirm Biopsy- Ribbon clip- positive   BREAST LUMPECTOMY Left 04/2018   CATARACT EXTRACTION  2004   DILATION AND CURETTAGE OF UTERUS     HIP FRACTURE SURGERY  09/15/2010   left hip   WRIST FRACTURE SURGERY     ORIF   Patient Active Problem List   Diagnosis Date Noted   Abscess or cellulitis of submandibular region 03/18/2024   Facial abscess 03/18/2024   Chronic midline low back pain without sciatica 02/28/2021   Osteoarthritis of both shoulders 02/28/2021   Glaucoma of both eyes 04/06/2018   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 (HCC) 04/03/2018   Abnormal chest x-ray 05/08/2015   Bloodgood disease 05/08/2015   Thoracic, thoracolumbar and lumbosacral intervertebral disc disorder 05/08/2015   Osteoporosis 04/07/2015   Vitamin D  deficiency 03/29/2015   Open-angle glaucoma 03/29/2015   Hypertension 03/29/2015   Seasonal allergies 03/29/2015   Osteoarthritis 03/29/2015   Fibrocystic breast disease 03/29/2015   Compression fracture of L1 lumbar vertebra (HCC) 03/29/2015   History of hip fracture 03/29/2015   Hyperlipemia 02/28/2015    PCP: Gasper Nancyann BRAVO,  MD   REFERRING PROVIDER: Gasper Nancyann BRAVO, MD   REFERRING DIAG: R26.9 (ICD-10-CM) - Gait disturbance  Rationale for Evaluation and Treatment: Rehabilitation  THERAPY DIAG:  Unsteadiness on feet  Difficulty in walking, not elsewhere classified  Muscle weakness (generalized)  History of falling  ONSET DATE: September 17, 2023 (pt fell backwards reaching for bed post)  SUBJECTIVE:  SUBJECTIVE STATEMENT: Has a doctor's appointment this coming Monday for her mouth. Got her chin drained last week.      PERTINENT HISTORY:  Gait disturbance. Pt got up in the middle of the night to go to the bathroom. Pt reached towards her bed but missed and feel onto her back, pt states she reached too early. Felt fine afterwards except 2 days with B LE discomfort. LE is now fine but L LE just feels awkward. Has a hx of L hip fx S/P surgery 15 years ago. Pt states not having pain or anything but feels like she needs exercise.    Blood pressure is controlled per pt. No latex allergies     PAIN:  None   PRECAUTIONS: Fall  RED FLAGS: No known red flags   WEIGHT BEARING RESTRICTIONS: No  FALLS:  Has patient fallen in last 6 months? Yes. Number of falls 1 first week in September 16, 2023.   LIVING ENVIRONMENT: Lives with: lives with their spouse Lives in: House/apartment Stairs: Yes: Internal: 15 steps; on right going up and External: 5 steps; bilateral but cannot reach both Has following equipment at home: Single point cane, Walker - 4 wheeled, and shower chair  OCCUPATION: retired  PLOF: Independent with household mobility with device  PATIENT GOALS: improve balance, improve ability to ambulate safely.   NEXT MD VISIT:   OBJECTIVE:  Note: Objective measures were completed at Evaluation unless  otherwise noted.  DIAGNOSTIC FINDINGS:  DG Ribs Unilateral Right 09/19/2023  Narrative & Impression  CLINICAL DATA:  Post fall, now with right-sided rib pain.   EXAM: RIGHT RIBS - 2 VIEW   COMPARISON:  Chest radiograph-09/18/2012   FINDINGS: Unchanged cardiac silhouette and mediastinal contours with atherosclerotic plaque within the thoracic aorta. No focal parenchymal opacities. No pleural effusion or pneumothorax. No evidence of edema.   No definite displaced right-sided rib fractures. Regional soft tissues appear normal. Surgical clips overlie the left breast.   IMPRESSION: 1. No definite displaced right-sided rib fractures. 2. No acute cardiopulmonary disease. 3. Aortic Atherosclerosis (ICD10-I70.0).     Electronically Signed   By: Norleen Roulette M.D.   On: 09/21/2023 16:40      PATIENT SURVEYS:  ABC scale: 36.25 % (12/18/2023)  COGNITION: Overall cognitive status: Within functional limits for tasks assessed     SENSATION:   MUSCLE LENGTH:   POSTURE: forward flexed, decreased B knee extension  PALPATION:   LUMBAR ROM:   AROM eval  Flexion   Extension   Right lateral flexion   Left lateral flexion   Right rotation   Left rotation    (Blank rows = not tested)  LOWER EXTREMITY ROM:     Passive  Right eval Left eval  Hip flexion    Hip extension    Hip abduction    Hip adduction    Hip internal rotation    Hip external rotation    Knee flexion    Knee extension    Ankle dorsiflexion    Ankle plantarflexion    Ankle inversion    Ankle eversion     (Blank rows = not tested)  LOWER EXTREMITY MMT:    MMT Right eval Left eval  Hip flexion 4 4 at available range with L lateral thigh pulling   Hip extension (seated manually resisted) 4 3  Hip abduction (seated manually resisted) 4 4-  Hip adduction    Hip internal rotation    Hip external rotation    Knee flexion  5 4  Knee extension 5 5  Ankle dorsiflexion    Ankle plantarflexion     Ankle inversion    Ankle eversion     (Blank rows = not tested)  LUMBAR SPECIAL TESTS:    FUNCTIONAL TESTS:  DGI: 7/24  GAIT: Distance walked: 40 ft Assistive device utilized: Single point cane Level of assistance: CGA Comments: antalgic, SPC on R hand, decreased stance L LE, decreased B knee extension, decreased step length, shuffling gait pattern whenever starting back up to walk after static position.    Pt tendency to reach too early for chair to sit, causing her center of gravity to leave her base of support.  Cues needed to walk to the chair and not to reach for the chair too early.   Cues for B femoral control to sit with proper hand placement.   Ascending stairs: R rail assist, reciprocal pattern, decreased B femoral control  Descaneind stairs: R rail assist, pt steps down with L LE first. 2 feet to a step, decreased B femoral control .    TREATMENT DATE: 03/25/2024                                                                                                                                Gait training  Performed to improve ability to ambulate with better foot clearance and step length.   NuStep seat 5, arms 5, level 1 for 5 minutes to promote reciprocal, alternating movement  Cues to maintain at least 70 SPM  Standing with B UE assist   Marches 10x3 each LE    Cues for large movement   Hip abduction 10x3 each LE   Cues for large movement  Marching around the gym 100 ft x 2 with SPC to promote large movements and foot clearance.    Gait 50 ft x 2 with SPC with multiple stops and multiple gait initiation, emphasis on large movements to decrease shuffling pattern.   Improved ability to perform larger movements during gait initiation.   Stepping over 4 mini hurdles with SPC and CGA 4x 2  Cues for getting close to obstacle and improving hip and knee flexion and step length for foot clearance.    Pt tripped multiple times during 2nd set secondary to not  stepping far enough to clear obstacle.    Therapeutic rest breaks secondary to fatigue.  Improved exercise technique, movement at target joints, use of target muscles after mod verbal, visual, tactile cues.          PATIENT EDUCATION:  Education details: there-ex, HEP Person educated: Patient Education method: Explanation, Demonstration, Tactile cues, Verbal cues, and Handouts Education comprehension: verbalized understanding, returned demonstration, verbal cues required, tactile cues required, and needs further education  HOME EXERCISE PROGRAM:  Seated hip flexion with opposite shoulder flexion 10x3 each LE to promote coodination and reciprocal movement  Picture provided  Gait training with rw/SPC with son, with emphasis on  hip and knee flexion, increase base of support and step length.   Pt and son Salbador) verbalized understanding   Access Code: [email or web address] URL: [email or web address] Date: 03/18/2024 Prepared by: Emil Glassman  Exercises - Sit to Stand with Counter Support  - 3 x daily - 7 x weekly - 1 sets - 5 reps - Seated Long Arc Quad  - 3 x daily - 7 x weekly - 3 sets - 10 reps - 10 seconds hold - Standing March with Counter Support  - 1 x daily - 7 x weekly - 3 sets - 10 reps - Standing Hip Abduction with Counter Support  - 1 x daily - 7 x weekly - 3 sets - 10 reps    ASSESSMENT:  CLINICAL IMPRESSION: Continued working on improving hip and knee flexion and step length to promote clearance for obstacle negotiation. Demonstrates shuffling gait pattern when initiating ambulation, mod cues needed to correct. Improved ability to perform with practice/repetition.   Pt tolerated session well without aggravation of symptoms. Pt will benefit from continued skilled physical therapy services to improve strength, balance, function, and gait.     OBJECTIVE IMPAIRMENTS: Abnormal gait, decreased balance, difficulty walking, decreased strength, improper body  mechanics, and postural dysfunction.   ACTIVITY LIMITATIONS: carrying, lifting, standing, squatting, stairs, and locomotion level  PARTICIPATION LIMITATIONS:   PERSONAL FACTORS: Age, Fitness, Past/current experiences, Time since onset of injury/illness/exacerbation, and 1-2 comorbidities: HTN, osteoporosis are also affecting patient's functional outcome.   REHAB POTENTIAL: Fair    CLINICAL DECISION MAKING: Stable/uncomplicated  EVALUATION COMPLEXITY: Low   GOALS: Goals reviewed with patient? Yes  SHORT TERM GOALS: Target date: 01/02/2024  Pt will be independent with her initial HEP to improve strength, femoral control, balance, and function.  Baseline: Pt has not yet started her initial HEP (12/18/2023); Doing her HEP, no questions (02/06/2024) Goal status: MET   LONG TERM GOALS: Target date: 04/09/2024  Pt will improve her Activities-specific Balance Confidence (ABC) Scale by at least 20% as a demonstration of improved balance.  Baseline: ABC scale sore: 36.25%, 36.25% (02/20/2024) Goal status: ONGOING  2.  Pt will improve her DGI score to 15/24 or more as a demonstration of improved balance.  Baseline: DGI score: 7/24 (12/18/2023); 6/24 (02/06/2024); 9/24 (02/20/2024) Goal status: ONGOING  3.  Pt will improve her B hip flexion, extension and abduction strength by at least 1/2 MMT to improve ability to ambulate, perform standing tasks more safely and with less difficulty.   Baseline:  MMT Right eval Left eval R (02/06/2024) L (02/06/2024) R  (02/20/2024) L (02/20/2024)  Hip flexion 4 4 at available range with L lateral thigh pulling  4+ 4+ 4+ 5  Hip extension (seated manually resisted) 4 3 4 4 4  4+  Hip abduction (seated manually resisted) 4 4- 4+ 4 5 5    (12/18/2023)  Goal status: PROGRESSING (all muscles improved except R hip extension)    PLAN:  PT FREQUENCY: 1-2x/week  PT DURATION: other: 7 weeks  PLANNED INTERVENTIONS: 97110-Therapeutic exercises, 97530- Therapeutic  activity, [id number]- Neuromuscular re-education, 97535- Self Care, [id number]- Manual therapy, [id number]- Gait training, [phone]- Canalith repositioning, [id number]- Electrical stimulation (unattended), Patient/Family education, Balance training, Stair training, and Vestibular training.  PLAN FOR NEXT SESSION: placing and maintaining center of gravity over her base of support, gait training, trunk and LE strengthening, proper mechanics, manual techniques, modalities PRN.    Essam Lowdermilk, PT, DPT 03/25/2024, 12:20 PM

## 2024-03-27 LAB — ANAEROBIC AND AEROBIC CULTURE

## 2024-03-29 ENCOUNTER — Ambulatory Visit

## 2024-03-29 DIAGNOSIS — R220 Localized swelling, mass and lump, head: Secondary | ICD-10-CM | POA: Diagnosis not present

## 2024-04-01 ENCOUNTER — Ambulatory Visit

## 2024-04-01 DIAGNOSIS — Z9181 History of falling: Secondary | ICD-10-CM

## 2024-04-01 DIAGNOSIS — M6281 Muscle weakness (generalized): Secondary | ICD-10-CM

## 2024-04-01 DIAGNOSIS — R2681 Unsteadiness on feet: Secondary | ICD-10-CM | POA: Diagnosis not present

## 2024-04-01 DIAGNOSIS — R262 Difficulty in walking, not elsewhere classified: Secondary | ICD-10-CM | POA: Diagnosis not present

## 2024-04-01 NOTE — Therapy (Signed)
 OUTPATIENT PHYSICAL THERAPY TREATMENT    Patient Name: MERLINE PERKIN MRN: [id number] DOB:[birth year], 83 y.o., female Today's Date: 04/01/2024  END OF SESSION:  PT End of Session - 04/01/24 1033     Visit Number 18    Number of Visits 31    Date for PT Re-Evaluation 04/09/24    PT Start Time 1033    PT Stop Time 1111    PT Time Calculation (min) 38 min    Equipment Utilized During Treatment Gait belt    Activity Tolerance Patient tolerated treatment well    Behavior During Therapy WFL for tasks assessed/performed                          Past Medical History:  Diagnosis Date   Allergy    Glaucoma    Hyperlipidemia    Hypertension    Osteoporosis    Pneumonia 03/29/2015   Past Surgical History:  Procedure Laterality Date   BREAST BIOPSY Left 03/16/2018   Affirm Biopsy- Ribbon clip- positive   BREAST LUMPECTOMY Left 04/2018   CATARACT EXTRACTION  2004   DILATION AND CURETTAGE OF UTERUS     HIP FRACTURE SURGERY  09/15/2010   left hip   WRIST FRACTURE SURGERY     ORIF   Patient Active Problem List   Diagnosis Date Noted   Abscess or cellulitis of submandibular region 03/18/2024   Facial abscess 03/18/2024   Chronic midline low back pain without sciatica 02/28/2021   Osteoarthritis of both shoulders 02/28/2021   Glaucoma of both eyes 04/06/2018   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 (HCC) 04/03/2018   Abnormal chest x-ray 05/08/2015   Bloodgood disease 05/08/2015   Thoracic, thoracolumbar and lumbosacral intervertebral disc disorder 05/08/2015   Osteoporosis 04/07/2015   Vitamin D  deficiency 03/29/2015   Open-angle glaucoma 03/29/2015   Hypertension 03/29/2015   Seasonal allergies 03/29/2015   Osteoarthritis 03/29/2015   Fibrocystic breast disease 03/29/2015   Compression fracture of L1 lumbar vertebra (HCC) 03/29/2015   History of hip fracture 03/29/2015   Hyperlipemia 02/28/2015    PCP: Gasper Nancyann BRAVO,  MD   REFERRING PROVIDER: Gasper Nancyann BRAVO, MD   REFERRING DIAG: R26.9 (ICD-10-CM) - Gait disturbance  Rationale for Evaluation and Treatment: Rehabilitation  THERAPY DIAG:  Unsteadiness on feet  Difficulty in walking, not elsewhere classified  Muscle weakness (generalized)  History of falling  ONSET DATE: September 17, 2023 (pt fell backwards reaching for bed post)  SUBJECTIVE:  SUBJECTIVE STATEMENT: Drove herself to today's session because her son had an appointment.     PERTINENT HISTORY:  Gait disturbance. Pt got up in the middle of the night to go to the bathroom. Pt reached towards her bed but missed and feel onto her back, pt states she reached too early. Felt fine afterwards except 2 days with B LE discomfort. LE is now fine but L LE just feels awkward. Has a hx of L hip fx S/P surgery 15 years ago. Pt states not having pain or anything but feels like she needs exercise.    Blood pressure is controlled per pt. No latex allergies     PAIN:  None   PRECAUTIONS: Fall  RED FLAGS: No known red flags   WEIGHT BEARING RESTRICTIONS: No  FALLS:  Has patient fallen in last 6 months? Yes. Number of falls 1 first week in September 16, 2023.   LIVING ENVIRONMENT: Lives with: lives with their spouse Lives in: House/apartment Stairs: Yes: Internal: 15 steps; on right going up and External: 5 steps; bilateral but cannot reach both Has following equipment at home: Single point cane, Walker - 4 wheeled, and shower chair  OCCUPATION: retired  PLOF: Independent with household mobility with device  PATIENT GOALS: improve balance, improve ability to ambulate safely.   NEXT MD VISIT:   OBJECTIVE:  Note: Objective measures were completed at Evaluation unless otherwise noted.  DIAGNOSTIC  FINDINGS:  DG Ribs Unilateral Right 09/19/2023  Narrative & Impression  CLINICAL DATA:  Post fall, now with right-sided rib pain.   EXAM: RIGHT RIBS - 2 VIEW   COMPARISON:  Chest radiograph-09/18/2012   FINDINGS: Unchanged cardiac silhouette and mediastinal contours with atherosclerotic plaque within the thoracic aorta. No focal parenchymal opacities. No pleural effusion or pneumothorax. No evidence of edema.   No definite displaced right-sided rib fractures. Regional soft tissues appear normal. Surgical clips overlie the left breast.   IMPRESSION: 1. No definite displaced right-sided rib fractures. 2. No acute cardiopulmonary disease. 3. Aortic Atherosclerosis (ICD10-I70.0).     Electronically Signed   By: Norleen Roulette M.D.   On: 09/21/2023 16:40      PATIENT SURVEYS:  ABC scale: 36.25 % (12/18/2023)  COGNITION: Overall cognitive status: Within functional limits for tasks assessed     SENSATION:   MUSCLE LENGTH:   POSTURE: forward flexed, decreased B knee extension  PALPATION:   LUMBAR ROM:   AROM eval  Flexion   Extension   Right lateral flexion   Left lateral flexion   Right rotation   Left rotation    (Blank rows = not tested)  LOWER EXTREMITY ROM:     Passive  Right eval Left eval  Hip flexion    Hip extension    Hip abduction    Hip adduction    Hip internal rotation    Hip external rotation    Knee flexion    Knee extension    Ankle dorsiflexion    Ankle plantarflexion    Ankle inversion    Ankle eversion     (Blank rows = not tested)  LOWER EXTREMITY MMT:    MMT Right eval Left eval  Hip flexion 4 4 at available range with L lateral thigh pulling   Hip extension (seated manually resisted) 4 3  Hip abduction (seated manually resisted) 4 4-  Hip adduction    Hip internal rotation    Hip external rotation    Knee flexion 5 4  Knee extension 5  5  Ankle dorsiflexion    Ankle plantarflexion    Ankle inversion    Ankle  eversion     (Blank rows = not tested)  LUMBAR SPECIAL TESTS:    FUNCTIONAL TESTS:  DGI: 7/24  GAIT: Distance walked: 40 ft Assistive device utilized: Single point cane Level of assistance: CGA Comments: antalgic, SPC on R hand, decreased stance L LE, decreased B knee extension, decreased step length, shuffling gait pattern whenever starting back up to walk after static position.    Pt tendency to reach too early for chair to sit, causing her center of gravity to leave her base of support.  Cues needed to walk to the chair and not to reach for the chair too early.   Cues for B femoral control to sit with proper hand placement.   Ascending stairs: R rail assist, reciprocal pattern, decreased B femoral control  Descaneind stairs: R rail assist, pt steps down with L LE first. 2 feet to a step, decreased B femoral control .    TREATMENT DATE: 04/01/2024                                                                                                                                Gait training  Performed to improve ability to ambulate with better foot clearance and step length.   NuStep seat 5, arms 5, level 1 for 5 minutes to promote reciprocal, alternating movement  Cues to maintain at least 70 SPM   Stepping over 4 mini hurdles with SPC and CGA 6x 2  Cues for getting close to obstacle and improving hip and knee flexion and step length for foot clearance.   LOB x 1 during first set on the 2nd to the last hurdle   LOB x 3 during 2nd set, L LE trips. Cues needed to lift L LE higher and increase L LE step length to clear obstacle.   Marching around the gym 200 ft with SPC to promote large movements and foot clearance.   Standing with B UE assist   Hip abduction 10x3 each LE   Cues for large movement   Marches 10x3 each LE    Cues for large movement    Gait with SPC to car for safety, CGA with cues to increase hip and knee flexion and step length for safety as  well   Therapeutic rest breaks secondary to fatigue.  Improved exercise technique, movement at target joints, use of target muscles after mod verbal, visual, tactile cues.          PATIENT EDUCATION:  Education details: there-ex, HEP Person educated: Patient Education method: Explanation, Demonstration, Tactile cues, Verbal cues, and Handouts Education comprehension: verbalized understanding, returned demonstration, verbal cues required, tactile cues required, and needs further education  HOME EXERCISE PROGRAM:  Seated hip flexion with opposite shoulder flexion 10x3 each LE to promote coodination and reciprocal movement  Picture provided  Gait training with  rw/SPC with son, with emphasis on hip and knee flexion, increase base of support and step length.   Pt and son Salbador) verbalized understanding   Access Code: [email or web address] URL: [email or web address] Date: 03/18/2024 Prepared by: Emil Glassman  Exercises - Sit to Stand with Counter Support  - 3 x daily - 7 x weekly - 1 sets - 5 reps - Seated Long Arc Quad  - 3 x daily - 7 x weekly - 3 sets - 10 reps - 10 seconds hold - Standing March with Counter Support  - 1 x daily - 7 x weekly - 3 sets - 10 reps - Standing Hip Abduction with Counter Support  - 1 x daily - 7 x weekly - 3 sets - 10 reps    ASSESSMENT:  CLINICAL IMPRESSION: Difficulty clearing obstacles when stepping over with L LE. Cues needed to lift L LE higher and increase L LE step length to clear mini hurdle obstacle. Cues needed to place and maintain her center of gravity over her base of support foot when walking at times during gait and obstacle negotiation.   Pt tolerated session well without aggravation of symptoms. Pt will benefit from continued skilled physical therapy services to improve strength, balance, function, and gait.     OBJECTIVE IMPAIRMENTS: Abnormal gait, decreased balance, difficulty walking, decreased strength, improper body  mechanics, and postural dysfunction.   ACTIVITY LIMITATIONS: carrying, lifting, standing, squatting, stairs, and locomotion level  PARTICIPATION LIMITATIONS:   PERSONAL FACTORS: Age, Fitness, Past/current experiences, Time since onset of injury/illness/exacerbation, and 1-2 comorbidities: HTN, osteoporosis are also affecting patient's functional outcome.   REHAB POTENTIAL: Fair    CLINICAL DECISION MAKING: Stable/uncomplicated  EVALUATION COMPLEXITY: Low   GOALS: Goals reviewed with patient? Yes  SHORT TERM GOALS: Target date: 01/02/2024  Pt will be independent with her initial HEP to improve strength, femoral control, balance, and function.  Baseline: Pt has not yet started her initial HEP (12/18/2023); Doing her HEP, no questions (02/06/2024) Goal status: MET   LONG TERM GOALS: Target date: 04/09/2024  Pt will improve her Activities-specific Balance Confidence (ABC) Scale by at least 20% as a demonstration of improved balance.  Baseline: ABC scale sore: 36.25%, 36.25% (02/20/2024) Goal status: ONGOING  2.  Pt will improve her DGI score to 15/24 or more as a demonstration of improved balance.  Baseline: DGI score: 7/24 (12/18/2023); 6/24 (02/06/2024); 9/24 (02/20/2024) Goal status: ONGOING  3.  Pt will improve her B hip flexion, extension and abduction strength by at least 1/2 MMT to improve ability to ambulate, perform standing tasks more safely and with less difficulty.   Baseline:  MMT Right eval Left eval R (02/06/2024) L (02/06/2024) R  (02/20/2024) L (02/20/2024)  Hip flexion 4 4 at available range with L lateral thigh pulling  4+ 4+ 4+ 5  Hip extension (seated manually resisted) 4 3 4 4 4  4+  Hip abduction (seated manually resisted) 4 4- 4+ 4 5 5    (12/18/2023)  Goal status: PROGRESSING (all muscles improved except R hip extension)    PLAN:  PT FREQUENCY: 1-2x/week  PT DURATION: other: 7 weeks  PLANNED INTERVENTIONS: 97110-Therapeutic exercises, 97530- Therapeutic  activity, [id number]- Neuromuscular re-education, 97535- Self Care, [id number]- Manual therapy, [id number]- Gait training, [phone]- Canalith repositioning, H9716- Electrical stimulation (unattended), Patient/Family education, Balance training, Stair training, and Vestibular training.  PLAN FOR NEXT SESSION: placing and maintaining center of gravity over her base of support, gait training, trunk and LE strengthening, proper mechanics, manual techniques, modalities PRN.  Darsha Zumstein, PT, DPT 04/01/2024, 11:15 AM

## 2024-04-05 ENCOUNTER — Ambulatory Visit

## 2024-04-05 DIAGNOSIS — M6281 Muscle weakness (generalized): Secondary | ICD-10-CM | POA: Diagnosis not present

## 2024-04-05 DIAGNOSIS — R2681 Unsteadiness on feet: Secondary | ICD-10-CM

## 2024-04-05 DIAGNOSIS — R262 Difficulty in walking, not elsewhere classified: Secondary | ICD-10-CM

## 2024-04-05 DIAGNOSIS — Z9181 History of falling: Secondary | ICD-10-CM

## 2024-04-05 NOTE — Therapy (Signed)
 OUTPATIENT PHYSICAL THERAPY TREATMENT    Patient Name: Anna Johnson MRN: [id number] DOB:[birth year], 83 y.o., female Today's Date: 04/05/2024  END OF SESSION:  PT End of Session - 04/05/24 1302     Visit Number 19    Number of Visits 31    Date for PT Re-Evaluation 04/09/24    PT Start Time 1303    PT Stop Time 1343    PT Time Calculation (min) 40 min    Equipment Utilized During Treatment Gait belt    Activity Tolerance Patient tolerated treatment well    Behavior During Therapy [facility] for tasks assessed/performed                           Past Medical History:  Diagnosis Date   Allergy    Glaucoma    Hyperlipidemia    Hypertension    Osteoporosis    Pneumonia 03/29/2015   Past Surgical History:  Procedure Laterality Date   BREAST BIOPSY Left 03/16/2018   Affirm Biopsy- Ribbon clip- positive   BREAST LUMPECTOMY Left 04/2018   CATARACT EXTRACTION  2004   DILATION AND CURETTAGE OF UTERUS     HIP FRACTURE SURGERY  09/15/2010   left hip   WRIST FRACTURE SURGERY     ORIF   Patient Active Problem List   Diagnosis Date Noted   Abscess or cellulitis of submandibular region 03/18/2024   Facial abscess 03/18/2024   Chronic midline low back pain without sciatica 02/28/2021   Osteoarthritis of both shoulders 02/28/2021   Glaucoma of both eyes 04/06/2018   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 (HCC) 04/03/2018   Abnormal chest x-ray 05/08/2015   Bloodgood disease 05/08/2015   Thoracic, thoracolumbar and lumbosacral intervertebral disc disorder 05/08/2015   Osteoporosis 04/07/2015   Vitamin D  deficiency 03/29/2015   Open-angle glaucoma 03/29/2015   Hypertension 03/29/2015   Seasonal allergies 03/29/2015   Osteoarthritis 03/29/2015   Fibrocystic breast disease 03/29/2015   Compression fracture of L1 lumbar vertebra (HCC) 03/29/2015   History of hip fracture 03/29/2015   Hyperlipemia 02/28/2015    PCP: Gasper Nancyann BRAVO,  MD   REFERRING PROVIDER: Gasper Nancyann BRAVO, MD   REFERRING DIAG: R26.9 (ICD-10-CM) - Gait disturbance  Rationale for Evaluation and Treatment: Rehabilitation  THERAPY DIAG:  Unsteadiness on feet  Difficulty in walking, not elsewhere classified  Muscle weakness (generalized)  History of falling  ONSET DATE: September 17, 2023 (pt fell backwards reaching for bed post)  SUBJECTIVE:  SUBJECTIVE STATEMENT: Son drove her today.     PERTINENT HISTORY:  Gait disturbance. Pt got up in the middle of the night to go to the bathroom. Pt reached towards her bed but missed and feel onto her back, pt states she reached too early. Felt fine afterwards except 2 days with B LE discomfort. LE is now fine but L LE just feels awkward. Has a hx of L hip fx S/P surgery 15 years ago. Pt states not having pain or anything but feels like she needs exercise.    Blood pressure is controlled per pt. No latex allergies     PAIN:  None   PRECAUTIONS: Fall  RED FLAGS: No known red flags   WEIGHT BEARING RESTRICTIONS: No  FALLS:  Has patient fallen in last 6 months? Yes. Number of falls 1 first week in September 16, 2023.   LIVING ENVIRONMENT: Lives with: lives with their spouse Lives in: House/apartment Stairs: Yes: Internal: 15 steps; on right going up and External: 5 steps; bilateral but cannot reach both Has following equipment at home: Single point cane, Walker - 4 wheeled, and shower chair  OCCUPATION: retired  PLOF: Independent with household mobility with device  PATIENT GOALS: improve balance, improve ability to ambulate safely.   NEXT MD VISIT:   OBJECTIVE:  Note: Objective measures were completed at Evaluation unless otherwise noted.  DIAGNOSTIC FINDINGS:  DG Ribs Unilateral Right  09/19/2023  Narrative & Impression  CLINICAL DATA:  Post fall, now with right-sided rib pain.   EXAM: RIGHT RIBS - 2 VIEW   COMPARISON:  Chest radiograph-09/18/2012   FINDINGS: Unchanged cardiac silhouette and mediastinal contours with atherosclerotic plaque within the thoracic aorta. No focal parenchymal opacities. No pleural effusion or pneumothorax. No evidence of edema.   No definite displaced right-sided rib fractures. Regional soft tissues appear normal. Surgical clips overlie the left breast.   IMPRESSION: 1. No definite displaced right-sided rib fractures. 2. No acute cardiopulmonary disease. 3. Aortic Atherosclerosis (ICD10-I70.0).     Electronically Signed   By: Norleen Roulette M.D.   On: 09/21/2023 16:40      PATIENT SURVEYS:  ABC scale: 36.25 % (12/18/2023)  COGNITION: Overall cognitive status: Within functional limits for tasks assessed     SENSATION:   MUSCLE LENGTH:   POSTURE: forward flexed, decreased B knee extension  PALPATION:   LUMBAR ROM:   AROM eval  Flexion   Extension   Right lateral flexion   Left lateral flexion   Right rotation   Left rotation    (Blank rows = not tested)  LOWER EXTREMITY ROM:     Passive  Right eval Left eval  Hip flexion    Hip extension    Hip abduction    Hip adduction    Hip internal rotation    Hip external rotation    Knee flexion    Knee extension    Ankle dorsiflexion    Ankle plantarflexion    Ankle inversion    Ankle eversion     (Blank rows = not tested)  LOWER EXTREMITY MMT:    MMT Right eval Left eval  Hip flexion 4 4 at available range with L lateral thigh pulling   Hip extension (seated manually resisted) 4 3  Hip abduction (seated manually resisted) 4 4-  Hip adduction    Hip internal rotation    Hip external rotation    Knee flexion 5 4  Knee extension 5 5  Ankle dorsiflexion  Ankle plantarflexion    Ankle inversion    Ankle eversion     (Blank rows = not  tested)  LUMBAR SPECIAL TESTS:    FUNCTIONAL TESTS:  DGI: 7/24 Dynamic Gait Index  Mark the lowest level that applies.   Date Performed 04/05/2024  Gait level surface (2) Mild Impairment: Walks 20', uses AD, slower speed, mild gait deviations  2. Change in gait speed (2) Mild Impairment: Is able to change speed but demonstrates mild gait deviations, or not gait deviations but unable to achieve a significant change in velocity, or uses an assistive device  3. Gait with horizontal head turns (2) Mild Impairment: Performs head turns smoothly with slight change in gait velocity, i.e., minor disruption to smooth gait path or uses walking aid  4. Gait with vertical head turns (2) Mild Impairment: Performs head turns smoothly with slight change in gait velocity, i.e., minor disruption to smooth gait path or uses walking aid  5. Gait and pivot turn (2) Mild Impairment: Pivot turns safely in > 3 seconds and stops with no loss of balance  6. Step over obstacle (0) Severe Impairment: Cannot perform without assistance  7. Step around obstacle (1) Moderate Impairment: Is able to clear cones but must significantly slow, speed to accomplish task, or requires verbal cueing  8. Steps (2) Mild Impairment: Alternating feet, must use rail  Total score 13/24    Score Interpretation: Score of <19 indicates high risk of falls.  Minimally Clinically Important Difference (MCID):  =DGI scores of<21/24 = 1.80 points DGI scores of >21/24 = 0.60 points   Beemer T, Inbar-Borovsky N, Brozgol M, Giladi N, [place] JM. The Dynamic Gait Index in healthy older adults: the role of stair climbing, fear of falling and gender. Gait Posture. 2009 Feb;29(2):237-41. doi: 10.1016/j.gaitpost.2008.08.013. Epub 2008 Oct 8. PMID: [id number]; PMCID: [id number].  Pardasaney, MYRTIS LOIS Bonus, GEANNIE POUR., et al. (2012). Sensitivity to change and responsiveness of four balance measures for community-dwelling older adults. Physical therapy  92(3): 388-397.    GAIT: Distance walked: 40 ft Assistive device utilized: Single point cane Level of assistance: CGA Comments: antalgic, SPC on R hand, decreased stance L LE, decreased B knee extension, decreased step length, shuffling gait pattern whenever starting back up to walk after static position.    Pt tendency to reach too early for chair to sit, causing her center of gravity to leave her base of support.  Cues needed to walk to the chair and not to reach for the chair too early.   Cues for B femoral control to sit with proper hand placement.   Ascending stairs: R rail assist, reciprocal pattern, decreased B femoral control  Descaneind stairs: R rail assist, pt steps down with L LE first. 2 feet to a step, decreased B femoral control .    TREATMENT DATE: 04/05/2024  Gait training  Performed to improve ability to ambulate with better foot clearance and step length.   NuStep seat 5, arms 5, level 1 for 5 minutes to promote reciprocal, alternating movement  Cues to maintain at least 70 SPM  Directed patient with gait with normal gait speed, with changes in speed, 180 degree pivot turn, with R and L cervical rotation position, with cervical flexion and extension position, all with and without SPC  With SPC: stepping around obstacles, stepping over an obstacle, ascending and descending 4 regular steps with UE assist   Unable to clear shoe box obstacle with L LE   With L LE: Stepping over 4 mini hurdles with SPC and CGA 6x 2  Cues for getting close to obstacle and improving hip and knee flexion and step length for foot clearance.   Did not clear L LE 3x during first set  Did not clear L LE 1x and trailing R LE 1x during second set.   Marching around the gym 200 ft with SPC to promote large movements and foot clearance.      Therapeutic rest  breaks secondary to fatigue.  Improved exercise technique, movement at target joints, use of target muscles after mod verbal, visual, tactile cues.          PATIENT EDUCATION:  Education details: there-ex, HEP Person educated: Patient Education method: Explanation, Demonstration, Tactile cues, Verbal cues, and Handouts Education comprehension: verbalized understanding, returned demonstration, verbal cues required, tactile cues required, and needs further education  HOME EXERCISE PROGRAM:  Seated hip flexion with opposite shoulder flexion 10x3 each LE to promote coodination and reciprocal movement  Picture provided  Gait training with rw/SPC with son, with emphasis on hip and knee flexion, increase base of support and step length.   Pt and son Salbador) verbalized understanding   Access Code: [email or web address] URL: [email or web address] Date: 03/18/2024 Prepared by: Emil Glassman  Exercises - Sit to Stand with Counter Support  - 3 x daily - 7 x weekly - 1 sets - 5 reps - Seated Long Arc Quad  - 3 x daily - 7 x weekly - 3 sets - 10 reps - 10 seconds hold - Standing March with Counter Support  - 1 x daily - 7 x weekly - 3 sets - 10 reps - Standing Hip Abduction with Counter Support  - 1 x daily - 7 x weekly - 3 sets - 10 reps    ASSESSMENT:  CLINICAL IMPRESSION: Improved DGI score compared to previous measurements suggesting improved balance during ambulation. Continued working on improving hip and knee flexion and step length to promote foot clearance so as to not trip on obstacles and decrease fall risk.   Pt tolerated session well without aggravation of symptoms. Pt will benefit from continued skilled physical therapy services to improve strength, balance, function, and gait.     OBJECTIVE IMPAIRMENTS: Abnormal gait, decreased balance, difficulty walking, decreased strength, improper body mechanics, and postural dysfunction.   ACTIVITY LIMITATIONS: carrying, lifting,  standing, squatting, stairs, and locomotion level  PARTICIPATION LIMITATIONS:   PERSONAL FACTORS: Age, Fitness, Past/current experiences, Time since onset of injury/illness/exacerbation, and 1-2 comorbidities: HTN, osteoporosis are also affecting patient's functional outcome.   REHAB POTENTIAL: Fair    CLINICAL DECISION MAKING: Stable/uncomplicated  EVALUATION COMPLEXITY: Low   GOALS: Goals reviewed with patient? Yes  SHORT TERM GOALS: Target date: 01/02/2024  Pt will be independent with her initial HEP to improve strength, femoral control, balance, and function.  Baseline: Pt has not  yet started her initial HEP (12/18/2023); Doing her HEP, no questions (02/06/2024) Goal status: MET   LONG TERM GOALS: Target date: 04/09/2024  Pt will improve her Activities-specific Balance Confidence (ABC) Scale by at least 20% as a demonstration of improved balance.  Baseline: ABC scale sore: 36.25%, 36.25% (02/20/2024) Goal status: ONGOING  2.  Pt will improve her DGI score to 15/24 or more as a demonstration of improved balance.  Baseline: DGI score: 7/24 (12/18/2023); 6/24 (02/06/2024); 9/24 (02/20/2024); 13/24 (04/05/2024) Goal status: PROGRESSING  3.  Pt will improve her B hip flexion, extension and abduction strength by at least 1/2 MMT to improve ability to ambulate, perform standing tasks more safely and with less difficulty.   Baseline:  MMT Right eval Left eval R (02/06/2024) L (02/06/2024) R  (02/20/2024) L (02/20/2024)    Hip flexion 4 4 at available range with L lateral thigh pulling  4+ 4+ 4+ 5    Hip extension (seated manually resisted) 4 3 4 4 4  4+    Hip abduction (seated manually resisted) 4 4- 4+ 4 5 5      (12/18/2023)  Goal status: PROGRESSING (all muscles improved except R hip extension)    PLAN:  PT FREQUENCY: 1-2x/week  PT DURATION: other: 7 weeks  PLANNED INTERVENTIONS: 97110-Therapeutic exercises, 97530- Therapeutic activity, [id number]- Neuromuscular re-education,  97535- Self Care, [id number]- Manual therapy, [id number]- Gait training, [phone]- Canalith repositioning, [id number]- Electrical stimulation (unattended), Patient/Family education, Balance training, Stair training, and Vestibular training.  PLAN FOR NEXT SESSION: placing and maintaining center of gravity over her base of support, gait training, trunk and LE strengthening, proper mechanics, manual techniques, modalities PRN.    Annitta Fifield, PT, DPT 04/05/2024, 3:03 PM

## 2024-04-08 ENCOUNTER — Ambulatory Visit

## 2024-04-14 ENCOUNTER — Ambulatory Visit: Attending: Family Medicine

## 2024-04-14 DIAGNOSIS — R262 Difficulty in walking, not elsewhere classified: Secondary | ICD-10-CM | POA: Insufficient documentation

## 2024-04-14 DIAGNOSIS — R2681 Unsteadiness on feet: Secondary | ICD-10-CM | POA: Insufficient documentation

## 2024-04-14 DIAGNOSIS — M6281 Muscle weakness (generalized): Secondary | ICD-10-CM | POA: Insufficient documentation

## 2024-04-14 DIAGNOSIS — Z9181 History of falling: Secondary | ICD-10-CM | POA: Diagnosis not present

## 2024-04-14 NOTE — Therapy (Signed)
 OUTPATIENT PHYSICAL THERAPY TREATMENT Progress Report (02/06/2024 - 04/14/2024) And Recertification     Patient Name: Anna Johnson MRN: [id number] DOB:01-31-1941, 83 y.o., female Today's Date: 04/14/2024  END OF SESSION:  PT End of Session - 04/14/24 1119     Visit Number 20    Number of Visits 31    Date for PT Re-Evaluation 04/23/24    PT Start Time 1120    PT Stop Time 1205    PT Time Calculation (min) 45 min    Equipment Utilized During Treatment Gait belt    Activity Tolerance Patient tolerated treatment well    Behavior During Therapy WFL for tasks assessed/performed                            Past Medical History:  Diagnosis Date   Allergy    Glaucoma    Hyperlipidemia    Hypertension    Osteoporosis    Pneumonia 03/29/2015   Past Surgical History:  Procedure Laterality Date   BREAST BIOPSY Left 03/16/2018   Affirm Biopsy- Ribbon clip- positive   BREAST LUMPECTOMY Left 04/2018   CATARACT EXTRACTION  2004   DILATION AND CURETTAGE OF UTERUS     HIP FRACTURE SURGERY  09/15/2010   left hip   WRIST FRACTURE SURGERY     ORIF   Patient Active Problem List   Diagnosis Date Noted   Abscess or cellulitis of submandibular region 03/18/2024   Facial abscess 03/18/2024   Chronic midline low back pain without sciatica 02/28/2021   Osteoarthritis of both shoulders 02/28/2021   Glaucoma of both eyes 04/06/2018   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 (HCC) 04/03/2018   Abnormal chest x-ray 05/08/2015   Bloodgood disease 05/08/2015   Thoracic, thoracolumbar and lumbosacral intervertebral disc disorder 05/08/2015   Osteoporosis 04/07/2015   Vitamin D  deficiency 03/29/2015   Open-angle glaucoma 03/29/2015   Hypertension 03/29/2015   Seasonal allergies 03/29/2015   Osteoarthritis 03/29/2015   Fibrocystic breast disease 03/29/2015   Compression fracture of L1 lumbar vertebra (HCC) 03/29/2015   History of hip fracture 03/29/2015    Hyperlipemia 02/28/2015    PCP: Gasper Nancyann BRAVO, MD   REFERRING PROVIDER: Gasper Nancyann BRAVO, MD   REFERRING DIAG: R26.9 (ICD-10-CM) - Gait disturbance  Rationale for Evaluation and Treatment: Rehabilitation  THERAPY DIAG:  Unsteadiness on feet - Plan: PT plan of care cert/re-cert  Difficulty in walking, not elsewhere classified - Plan: PT plan of care cert/re-cert  Muscle weakness (generalized) - Plan: PT plan of care cert/re-cert  History of falling - Plan: PT plan of care cert/re-cert  ONSET DATE: September 17, 2023 (pt fell backwards reaching for bed post)  SUBJECTIVE:  SUBJECTIVE STATEMENT: Walking is a little better. Wants to continue PT until the end of next week. Feels like she can continue with her HEP after that.    PERTINENT HISTORY:  Gait disturbance. Pt got up in the middle of the night to go to the bathroom. Pt reached towards her bed but missed and feel onto her back, pt states she reached too early. Felt fine afterwards except 2 days with B LE discomfort. LE is now fine but L LE just feels awkward. Has a hx of L hip fx S/P surgery 15 years ago. Pt states not having pain or anything but feels like she needs exercise.    Blood pressure is controlled per pt. No latex allergies     PAIN:  None   PRECAUTIONS: Fall  RED FLAGS: No known red flags   WEIGHT BEARING RESTRICTIONS: No  FALLS:  Has patient fallen in last 6 months? Yes. Number of falls 1 first week in September 16, 2023.   LIVING ENVIRONMENT: Lives with: lives with their spouse Lives in: House/apartment Stairs: Yes: Internal: 15 steps; on right going up and External: 5 steps; bilateral but cannot reach both Has following equipment at home: Single point cane, Walker - 4 wheeled, and shower chair  OCCUPATION:  retired  PLOF: Independent with household mobility with device  PATIENT GOALS: improve balance, improve ability to ambulate safely.   NEXT MD VISIT:   OBJECTIVE:  Note: Objective measures were completed at Evaluation unless otherwise noted.  DIAGNOSTIC FINDINGS:  DG Ribs Unilateral Right 09/19/2023  Narrative & Impression  CLINICAL DATA:  Post fall, now with right-sided rib pain.   EXAM: RIGHT RIBS - 2 VIEW   COMPARISON:  Chest radiograph-09/18/2012   FINDINGS: Unchanged cardiac silhouette and mediastinal contours with atherosclerotic plaque within the thoracic aorta. No focal parenchymal opacities. No pleural effusion or pneumothorax. No evidence of edema.   No definite displaced right-sided rib fractures. Regional soft tissues appear normal. Surgical clips overlie the left breast.   IMPRESSION: 1. No definite displaced right-sided rib fractures. 2. No acute cardiopulmonary disease. 3. Aortic Atherosclerosis (ICD10-I70.0).     Electronically Signed   By: Norleen Roulette M.D.   On: 09/21/2023 16:40      PATIENT SURVEYS:  ABC scale: 36.25 % (12/18/2023)  COGNITION: Overall cognitive status: Within functional limits for tasks assessed     SENSATION:   MUSCLE LENGTH:   POSTURE: forward flexed, decreased B knee extension  PALPATION:   LUMBAR ROM:   AROM eval  Flexion   Extension   Right lateral flexion   Left lateral flexion   Right rotation   Left rotation    (Blank rows = not tested)  LOWER EXTREMITY ROM:     Passive  Right eval Left eval  Hip flexion    Hip extension    Hip abduction    Hip adduction    Hip internal rotation    Hip external rotation    Knee flexion    Knee extension    Ankle dorsiflexion    Ankle plantarflexion    Ankle inversion    Ankle eversion     (Blank rows = not tested)  LOWER EXTREMITY MMT:    MMT Right eval Left eval  Hip flexion 4 4 at available range with L lateral thigh pulling   Hip extension (seated  manually resisted) 4 3  Hip abduction (seated manually resisted) 4 4-  Hip adduction    Hip internal rotation  Hip external rotation    Knee flexion 5 4  Knee extension 5 5  Ankle dorsiflexion    Ankle plantarflexion    Ankle inversion    Ankle eversion     (Blank rows = not tested)  LUMBAR SPECIAL TESTS:    FUNCTIONAL TESTS:  DGI: 7/24 Dynamic Gait Index  Mark the lowest level that applies.   Date Performed 04/05/2024  Gait level surface (2) Mild Impairment: Walks 20', uses AD, slower speed, mild gait deviations  2. Change in gait speed (2) Mild Impairment: Is able to change speed but demonstrates mild gait deviations, or not gait deviations but unable to achieve a significant change in velocity, or uses an assistive device  3. Gait with horizontal head turns (2) Mild Impairment: Performs head turns smoothly with slight change in gait velocity, i.e., minor disruption to smooth gait path or uses walking aid  4. Gait with vertical head turns (2) Mild Impairment: Performs head turns smoothly with slight change in gait velocity, i.e., minor disruption to smooth gait path or uses walking aid  5. Gait and pivot turn (2) Mild Impairment: Pivot turns safely in > 3 seconds and stops with no loss of balance  6. Step over obstacle (0) Severe Impairment: Cannot perform without assistance  7. Step around obstacle (1) Moderate Impairment: Is able to clear cones but must significantly slow, speed to accomplish task, or requires verbal cueing  8. Steps (2) Mild Impairment: Alternating feet, must use rail  Total score 13/24    Score Interpretation: Score of <19 indicates high risk of falls.  Minimally Clinically Important Difference (MCID):  =DGI scores of<21/24 = 1.80 points DGI scores of >21/24 = 0.60 points   Cole T, Inbar-Borovsky N, Brozgol M, Giladi N, [place] JM. The Dynamic Gait Index in healthy older adults: the role of stair climbing, fear of falling and gender. Gait Posture.  2009 Feb;29(2):237-41. doi: 10.1016/j.gaitpost.2008.08.013. Epub 2008 Oct 8. PMID: 81154560; PMCID: EFR7290501.  Pardasaney, MYRTIS LOIS Bonus, GEANNIE POUR., et al. (2012). Sensitivity to change and responsiveness of four balance measures for community-dwelling older adults. Physical therapy 92(3): 388-397.    GAIT: Distance walked: 40 ft Assistive device utilized: Single point cane Level of assistance: CGA Comments: antalgic, SPC on R hand, decreased stance L LE, decreased B knee extension, decreased step length, shuffling gait pattern whenever starting back up to walk after static position.    Pt tendency to reach too early for chair to sit, causing her center of gravity to leave her base of support.  Cues needed to walk to the chair and not to reach for the chair too early.   Cues for B femoral control to sit with proper hand placement.   Ascending stairs: R rail assist, reciprocal pattern, decreased B femoral control  Descaneind stairs: R rail assist, pt steps down with L LE first. 2 feet to a step, decreased B femoral control .    TREATMENT DATE: 04/14/2024  Gait training  Performed to improve ability to ambulate with better foot clearance and step length.   NuStep seat 5, arms 5, level 1 for 5 minutes to promote reciprocal, alternating movement  Cues to maintain at least 70 SPM  Gait around gym with emphasis on improving hip and knee flexion and step length 100 ft. Able to stop and start back up again with minimal shuffling of feet.   With L LE: Stepping over 4 mini hurdles with SPC and CGA 4x 2  Cues for getting close to obstacle and improving hip and knee flexion and step length for foot clearance.    Did not clear L LE 1x during first se   Did not clear trailing R LE 1x and trailing during second set.   Gait around the gym 200 ft with SPC to promote  large movements and foot clearance.   With L LE: Stepping over 4 mini hurdles with SPC and CGA 4x2  Cues for getting close to obstacle and improving hip and knee flexion and step length for foot clearance.   Stepped on one mini hurdle with L foot one time during last repetition.   Stepped on one mini hurdle with L foot 1x and did not clear trailing R foot one time during second set.   Seated manually resisted hip extension R 1x Reviewed progress with R hip extension strength with pt.    Therapeutic rest breaks secondary to fatigue.  Improved exercise technique, movement at target joints, use of target muscles after mod verbal, visual, tactile cues.          PATIENT EDUCATION:  Education details: there-ex, HEP Person educated: Patient Education method: Explanation, Demonstration, Tactile cues, Verbal cues, and Handouts Education comprehension: verbalized understanding, returned demonstration, verbal cues required, tactile cues required, and needs further education  HOME EXERCISE PROGRAM:  Seated hip flexion with opposite shoulder flexion 10x3 each LE to promote coodination and reciprocal movement  Picture provided  Gait training with rw/SPC with son, with emphasis on hip and knee flexion, increase base of support and step length.   Pt and son Salbador) verbalized understanding   Access Code: [email or web address] URL: [email or web address] Date: 03/18/2024 Prepared by: Emil Glassman  Exercises - Sit to Stand with Counter Support  - 3 x daily - 7 x weekly - 1 sets - 5 reps - Seated Long Arc Quad  - 3 x daily - 7 x weekly - 3 sets - 10 reps - 10 seconds hold - Standing March with Counter Support  - 1 x daily - 7 x weekly - 3 sets - 10 reps - Standing Hip Abduction with Counter Support  - 1 x daily - 7 x weekly - 3 sets - 10 reps    ASSESSMENT:  CLINICAL IMPRESSION: Pt demonstrates overall improved hip and knee flexion and step length during gait with decreased  shuffling as well as improved DGI and improved B hip strength since initial evaluation. Pt making progress with PT towards goals. Continued working on improving hip and knee flexion and step length to promote foot clearance so as to not trip on obstacles and decrease fall risk.   Pt tolerated session well without aggravation of symptoms. Pt will benefit from continued skilled physical therapy services to improve strength, balance, function, and gait.     OBJECTIVE IMPAIRMENTS: Abnormal gait, decreased balance, difficulty walking, decreased strength, improper body mechanics, and postural dysfunction.   ACTIVITY LIMITATIONS: carrying, lifting, standing, squatting, stairs, and locomotion level  PARTICIPATION LIMITATIONS:  PERSONAL FACTORS: Age, Fitness, Past/current experiences, Time since onset of injury/illness/exacerbation, and 1-2 comorbidities: HTN, osteoporosis are also affecting patient's functional outcome.   REHAB POTENTIAL: Fair    CLINICAL DECISION MAKING: Stable/uncomplicated  EVALUATION COMPLEXITY: Low   GOALS: Goals reviewed with patient? Yes  SHORT TERM GOALS: Target date: 01/02/2024  Pt will be independent with her initial HEP to improve strength, femoral control, balance, and function.  Baseline: Pt has not yet started her initial HEP (12/18/2023); Doing her HEP, no questions (02/06/2024) Goal status: MET   LONG TERM GOALS: Target date: 04/23/2024  Pt will improve her Activities-specific Balance Confidence (ABC) Scale by at least 20% as a demonstration of improved balance.  Baseline: ABC scale sore: 36.25%, 36.25% (02/20/2024); unable to assess secondary to multiple N/A answers (04/14/2024) Goal status: ONGOING  2.  Pt will improve her DGI score to 15/24 or more as a demonstration of improved balance.  Baseline: DGI score: 7/24 (12/18/2023); 6/24 (02/06/2024); 9/24 (02/20/2024); 13/24 (04/05/2024) Goal status: PROGRESSING  3.  Pt will improve her B hip flexion, extension  and abduction strength by at least 1/2 MMT to improve ability to ambulate, perform standing tasks more safely and with less difficulty.   Baseline:  MMT Right eval Left eval R (02/06/2024) L (02/06/2024) R  (02/20/2024) L (02/20/2024) R (04/14/2024)   Hip flexion 4 4 at available range with L lateral thigh pulling  4+ 4+ 4+ 5    Hip extension (seated manually resisted) 4 3 4 4 4  4+ 4+   Hip abduction (seated manually resisted) 4 4- 4+ 4 5 5      (12/18/2023)  Goal status: MET     PLAN:  PT FREQUENCY: 1-2x/week  PT DURATION: other: 1 week  PLANNED INTERVENTIONS: 97110-Therapeutic exercises, 97530- Therapeutic activity, 97112- Neuromuscular re-education, 97535- Self Care, 02859- Manual therapy, [id number]- Gait training, [id number]- Canalith repositioning, H9716- Electrical stimulation (unattended), Patient/Family education, Balance training, Stair training, and Vestibular training.  PLAN FOR NEXT SESSION: placing and maintaining center of gravity over her base of support, gait training, trunk and LE strengthening, proper mechanics, manual techniques, modalities PRN.    Blaise Palladino, PT, DPT 04/14/2024, 2:58 PM

## 2024-04-19 ENCOUNTER — Encounter: Payer: Self-pay | Admitting: Family Medicine

## 2024-04-19 ENCOUNTER — Ambulatory Visit: Admitting: Family Medicine

## 2024-04-19 VITALS — BP 123/55 | HR 60 | Resp 16 | Wt 109.1 lb

## 2024-04-19 DIAGNOSIS — E785 Hyperlipidemia, unspecified: Secondary | ICD-10-CM

## 2024-04-19 DIAGNOSIS — R413 Other amnesia: Secondary | ICD-10-CM | POA: Diagnosis not present

## 2024-04-19 DIAGNOSIS — Z23 Encounter for immunization: Secondary | ICD-10-CM

## 2024-04-19 DIAGNOSIS — G3184 Mild cognitive impairment, so stated: Secondary | ICD-10-CM

## 2024-04-19 NOTE — Progress Notes (Signed)
 Established patient visit   Patient: Anna Johnson   DOB: [birth year]   83 y.o. Female  MRN: [id number] Visit Date: 04/19/2024  Today's healthcare provider: Nancyann Perry, MD   Chief Complaint  Patient presents with   Memory Loss   Subjective    Discussed the use of AI scribe software for clinical note transcription with the patient, who gave verbal consent to proceed.  History of Present Illness   Anna Johnson is an 83 year old female who presents with memory issues. She is accompanied by her son, [name].  She has been experiencing memory difficulties, including forgetting conversations and sometimes who she is talking to. She struggles with remembering names and places, such as the name of a bank or a street. She reports that her memory has gotten a little worse in the last couple of weeks. Previously, she had a good memory and was often relied upon by friends for information. Since January, she has noticed a decline, particularly after a fall and subsequent therapy for her leg. No episodes of confusion regarding her location or activities, as she knows where she is and what street she is on, but struggles with recalling names of places.  She recalls hitting her head while getting into a car, which did not initially hurt or swell, but later began to bother her. This incident led her to seek medical attention. A procedure was performed to clean out the area, and she was on medication for ten days following the procedure. She experienced significant bleeding after the procedure.  She has been taking lovastatin , a cholesterol medication, at night. Her father was on memory medication for about two years.       Medications: Outpatient Medications Prior to Visit  Medication Sig   Acetaminophen 500 MG capsule Take 2 capsules by mouth as needed for fever.   alendronate  (FOSAMAX ) 70 MG tablet TAKE 1 TABLET BY MOUTH WEEKLY. TAKE IN THE MORNING WITH 8 OZ OF WATER. DO NOT EAT, DRINK OR  LIE DOWN FOR 30 MIN AFTER.   amLODipine  (NORVASC ) 5 MG tablet TAKE 1 TABLET BY MOUTH DAILY   amoxicillin (AMOXIL) 500 MG capsule Take 2,000 mg by mouth as directed. Prior to dental procedures   aspirin 81 MG tablet Take 81 mg by mouth daily.   benazepril  (LOTENSIN ) 20 MG tablet TAKE 1 TABLET BY MOUTH DAILY. TAKE IN PLACE OF QUINAPRIL .   cholecalciferol (VITAMIN D ) 1000 UNITS tablet Take 2,000 Units by mouth daily.    Coenzyme Q10 (CO Q-10) 200 MG CAPS Take 200 mg by mouth daily.   dorzolamide-timolol (COSOPT) 22.3-6.8 MG/ML ophthalmic solution    fluticasone  (FLONASE ) 50 MCG/ACT nasal spray USE TWO SPRAYS IN EACH NOSTRIL EACH DAY AS DIRECTED BY PHYSICIAN   hydrochlorothiazide  (HYDRODIURIL ) 25 MG tablet TAKE 1 TABLET BY MOUTH DAILY   latanoprost (XALATAN) 0.005 % ophthalmic solution Place 1 drop into both eyes at bedtime.   letrozole (FEMARA) 2.5 MG tablet Take 2.5 mg by mouth daily.   loratadine (CLARITIN) 10 MG tablet Take 10 mg by mouth daily as needed for allergies.   lovastatin  (MEVACOR ) 40 MG tablet TAKE 1 TABLET BY MOUTH AT BEDTIME   LUMIGAN 0.01 % SOLN Place 1 drop into both eyes at bedtime.    metoprolol  succinate (TOPROL -XL) 50 MG 24 hr tablet TAKE 1 TABLET BY MOUTH DAILY WITH ANY MEAL OR IMMEDIATELY FOLLOWING A MEAL   povidone-iodine  (BETADINE ) 10 % external solution Apply 1 Application topically as  needed for wound care.   timolol (TIMOPTIC) 0.5 % ophthalmic solution Apply to eye.   No facility-administered medications prior to visit.   Review of Systems  Constitutional:  Negative for appetite change, chills, fatigue and fever.  Respiratory:  Negative for chest tightness and shortness of breath.   Cardiovascular:  Negative for chest pain and palpitations.  Gastrointestinal:  Negative for abdominal pain, nausea and vomiting.  Neurological:  Negative for dizziness and weakness.       Objective    BP (!) 123/55 (BP Location: Left Arm, Patient Position: Sitting, Cuff Size:  Normal)   Pulse 60   Resp 16   Wt 109 lb 1.6 oz (49.5 kg)   SpO2 100%   BMI 19.95 kg/m   Physical Exam   General appearance: Thin female, cooperative and in no acute distress Head: Normocephalic, without obvious abnormality, atraumatic Respiratory: Respirations even and unlabored, normal respiratory rate Extremities: All extremities are intact.  Skin: Skin color, texture, turgor normal. No rashes seen  Psych: Appropriate mood and affect. Neurologic: Mental status: Alert, oriented to person, place, and time, thought content appropriate.     04/19/2024    2:45 PM 04/29/2023   10:49 AM 12/28/2019   10:49 AM 12/24/2017    9:21 AM 12/04/2016    1:55 PM  6CIT Screen  What Year? 0 points 0 points 0 points 0 points 0 points  What month? 0 points 0 points 0 points 0 points 0 points  What time? 0 points 0 points 0 points 0 points 0 points  Count back from 20 0 points 0 points 0 points 0 points 0 points  Months in reverse 0 points 0 points 0 points 0 points 0 points  Repeat phrase 6 points 0 points 0 points 0 points 0 points  Total Score 6 points 0 points 0 points 0 points 0 points     Assessment & Plan      1. Memory deficit (Primary) Memory change is typical for age, although a noticable decline from her baseline.   - CBC - Comprehensive metabolic panel with GFR - Vitamin B12 - TSH  Put statin on hold. Consider trial of Aricept   2. Mild cognitive impairment   3. Hyperlipidemia, unspecified hyperlipidemia type  - Lipid panel  4. Immunization due  - Flu vaccine HIGH DOSE PF(Fluzone Trivalent)      Nancyann Perry, MD  [facility] [phone] (phone) [phone] (fax)  [facility]

## 2024-04-20 ENCOUNTER — Ambulatory Visit

## 2024-04-20 DIAGNOSIS — Z9181 History of falling: Secondary | ICD-10-CM

## 2024-04-20 DIAGNOSIS — R262 Difficulty in walking, not elsewhere classified: Secondary | ICD-10-CM | POA: Diagnosis not present

## 2024-04-20 DIAGNOSIS — R2681 Unsteadiness on feet: Secondary | ICD-10-CM

## 2024-04-20 DIAGNOSIS — M6281 Muscle weakness (generalized): Secondary | ICD-10-CM | POA: Diagnosis not present

## 2024-04-20 LAB — COMPREHENSIVE METABOLIC PANEL WITH GFR
ALT: 17 IU/L (ref 0–32)
AST: 28 IU/L (ref 0–40)
Albumin: 3.8 g/dL (ref 3.7–4.7)
Alkaline Phosphatase: 72 IU/L (ref 44–121)
BUN/Creatinine Ratio: 18 (ref 12–28)
BUN: 13 mg/dL (ref 8–27)
Bilirubin Total: 0.5 mg/dL (ref 0.0–1.2)
CO2: 22 mmol/L (ref 20–29)
Calcium: 9.8 mg/dL (ref 8.7–10.3)
Chloride: 99 mmol/L (ref 96–106)
Creatinine, Ser: 0.73 mg/dL (ref 0.57–1.00)
Globulin, Total: 2.6 g/dL (ref 1.5–4.5)
Glucose: 88 mg/dL (ref 70–99)
Potassium: 4.3 mmol/L (ref 3.5–5.2)
Sodium: 135 mmol/L (ref 134–144)
Total Protein: 6.4 g/dL (ref 6.0–8.5)
eGFR: 81 mL/min/1.73 (ref >59)

## 2024-04-20 LAB — LIPID PANEL
Chol/HDL Ratio: 2 ratio (ref 0.0–4.4)
Cholesterol, Total: 184 mg/dL (ref 100–199)
HDL: 91 mg/dL (ref >39)
LDL Chol Calc (NIH): 83 mg/dL (ref 0–99)
Triglycerides: 51 mg/dL (ref 0–149)
VLDL Cholesterol Cal: 10 mg/dL (ref 5–40)

## 2024-04-20 LAB — CBC
Hematocrit: 35.5 % (ref 34.0–46.6)
Hemoglobin: 11.7 g/dL (ref 11.1–15.9)
MCH: 33.1 pg — ABNORMAL HIGH (ref 26.6–33.0)
MCHC: 33 g/dL (ref 31.5–35.7)
MCV: 100 fL — ABNORMAL HIGH (ref 79–97)
Platelets: 277 x10E3/uL (ref 150–450)
RBC: 3.54 x10E6/uL — ABNORMAL LOW (ref 3.77–5.28)
RDW: 12.7 % (ref 11.7–15.4)
WBC: 6.1 x10E3/uL (ref 3.4–10.8)

## 2024-04-20 LAB — TSH: TSH: 0.757 u[IU]/mL (ref 0.450–4.500)

## 2024-04-20 LAB — VITAMIN B12: Vitamin B-12: 356 pg/mL (ref 232–1245)

## 2024-04-20 NOTE — Therapy (Signed)
 OUTPATIENT PHYSICAL THERAPY TREATMENT     Patient Name: GWYNEVERE LIZANA MRN: [id number] DOB:[birth year], 83 y.o., female Today's Date: 04/20/2024  END OF SESSION:  PT End of Session - 04/20/24 1121     Visit Number 21    Number of Visits 31    Date for PT Re-Evaluation 04/23/24    PT Start Time 1121    PT Stop Time 1201    PT Time Calculation (min) 40 min    Equipment Utilized During Treatment Gait belt    Activity Tolerance Patient tolerated treatment well    Behavior During Therapy [facility] for tasks assessed/performed                             Past Medical History:  Diagnosis Date   Allergy    Glaucoma    Hyperlipidemia    Hypertension    Osteoporosis    Pneumonia 03/29/2015   Past Surgical History:  Procedure Laterality Date   BREAST BIOPSY Left 03/16/2018   Affirm Biopsy- Ribbon clip- positive   BREAST LUMPECTOMY Left 04/2018   CATARACT EXTRACTION  2004   DILATION AND CURETTAGE OF UTERUS     HIP FRACTURE SURGERY  09/15/2010   left hip   WRIST FRACTURE SURGERY     ORIF   Patient Active Problem List   Diagnosis Date Noted   Chronic midline low back pain without sciatica 02/28/2021   Osteoarthritis of both shoulders 02/28/2021   Glaucoma of both eyes 04/06/2018   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 (HCC) 04/03/2018   Thoracic, thoracolumbar and lumbosacral intervertebral disc disorder 05/08/2015   Osteoporosis 04/07/2015   Vitamin D  deficiency 03/29/2015   Open-angle glaucoma 03/29/2015   Hypertension 03/29/2015   Seasonal allergies 03/29/2015   Osteoarthritis 03/29/2015   Fibrocystic breast disease 03/29/2015   Compression fracture of L1 lumbar vertebra (HCC) 03/29/2015   History of hip fracture 03/29/2015   Hyperlipemia 02/28/2015    PCP: Gasper Nancyann BRAVO, MD   REFERRING PROVIDER: Gasper Nancyann BRAVO, MD   REFERRING DIAG: R26.9 (ICD-10-CM) - Gait disturbance  Rationale for Evaluation and Treatment:  Rehabilitation  THERAPY DIAG:  Unsteadiness on feet  Difficulty in walking, not elsewhere classified  Muscle weakness (generalized)  History of falling  ONSET DATE: September 17, 2023 (pt fell backwards reaching for bed post)  SUBJECTIVE:                                                                                                                                                                                           SUBJECTIVE STATEMENT:  Doing ok. Walking is pretty good.    PERTINENT HISTORY:  Gait disturbance. Pt got up in the middle of the night to go to the bathroom. Pt reached towards her bed but missed and feel onto her back, pt states she reached too early. Felt fine afterwards except 2 days with B LE discomfort. LE is now fine but L LE just feels awkward. Has a hx of L hip fx S/P surgery 15 years ago. Pt states not having pain or anything but feels like she needs exercise.    Blood pressure is controlled per pt. No latex allergies     PAIN:  None   PRECAUTIONS: Fall  RED FLAGS: No known red flags   WEIGHT BEARING RESTRICTIONS: No  FALLS:  Has patient fallen in last 6 months? Yes. Number of falls 1 first week in September 16, 2023.   LIVING ENVIRONMENT: Lives with: lives with their spouse Lives in: House/apartment Stairs: Yes: Internal: 15 steps; on right going up and External: 5 steps; bilateral but cannot reach both Has following equipment at home: Single point cane, Walker - 4 wheeled, and shower chair  OCCUPATION: retired  PLOF: Independent with household mobility with device  PATIENT GOALS: improve balance, improve ability to ambulate safely.   NEXT MD VISIT:   OBJECTIVE:  Note: Objective measures were completed at Evaluation unless otherwise noted.  DIAGNOSTIC FINDINGS:  DG Ribs Unilateral Right 09/19/2023  Narrative & Impression  CLINICAL DATA:  Post fall, now with right-sided rib pain.   EXAM: RIGHT RIBS - 2 VIEW   COMPARISON:  Chest  radiograph-09/18/2012   FINDINGS: Unchanged cardiac silhouette and mediastinal contours with atherosclerotic plaque within the thoracic aorta. No focal parenchymal opacities. No pleural effusion or pneumothorax. No evidence of edema.   No definite displaced right-sided rib fractures. Regional soft tissues appear normal. Surgical clips overlie the left breast.   IMPRESSION: 1. No definite displaced right-sided rib fractures. 2. No acute cardiopulmonary disease. 3. Aortic Atherosclerosis (ICD10-I70.0).     Electronically Signed   By: Norleen Roulette M.D.   On: 09/21/2023 16:40      PATIENT SURVEYS:  ABC scale: 36.25 % (12/18/2023)  COGNITION: Overall cognitive status: Within functional limits for tasks assessed     SENSATION:   MUSCLE LENGTH:   POSTURE: forward flexed, decreased B knee extension  PALPATION:   LUMBAR ROM:   AROM eval  Flexion   Extension   Right lateral flexion   Left lateral flexion   Right rotation   Left rotation    (Blank rows = not tested)  LOWER EXTREMITY ROM:     Passive  Right eval Left eval  Hip flexion    Hip extension    Hip abduction    Hip adduction    Hip internal rotation    Hip external rotation    Knee flexion    Knee extension    Ankle dorsiflexion    Ankle plantarflexion    Ankle inversion    Ankle eversion     (Blank rows = not tested)  LOWER EXTREMITY MMT:    MMT Right eval Left eval  Hip flexion 4 4 at available range with L lateral thigh pulling   Hip extension (seated manually resisted) 4 3  Hip abduction (seated manually resisted) 4 4-  Hip adduction    Hip internal rotation    Hip external rotation    Knee flexion 5 4  Knee extension 5 5  Ankle dorsiflexion    Ankle  plantarflexion    Ankle inversion    Ankle eversion     (Blank rows = not tested)  LUMBAR SPECIAL TESTS:    FUNCTIONAL TESTS:  DGI: 7/24 Dynamic Gait Index  Mark the lowest level that applies.   Date Performed 04/05/2024   Gait level surface (2) Mild Impairment: Walks 20', uses AD, slower speed, mild gait deviations  2. Change in gait speed (2) Mild Impairment: Is able to change speed but demonstrates mild gait deviations, or not gait deviations but unable to achieve a significant change in velocity, or uses an assistive device  3. Gait with horizontal head turns (2) Mild Impairment: Performs head turns smoothly with slight change in gait velocity, i.e., minor disruption to smooth gait path or uses walking aid  4. Gait with vertical head turns (2) Mild Impairment: Performs head turns smoothly with slight change in gait velocity, i.e., minor disruption to smooth gait path or uses walking aid  5. Gait and pivot turn (2) Mild Impairment: Pivot turns safely in > 3 seconds and stops with no loss of balance  6. Step over obstacle (0) Severe Impairment: Cannot perform without assistance  7. Step around obstacle (1) Moderate Impairment: Is able to clear cones but must significantly slow, speed to accomplish task, or requires verbal cueing  8. Steps (2) Mild Impairment: Alternating feet, must use rail  Total score 13/24    Score Interpretation: Score of <19 indicates high risk of falls.  Minimally Clinically Important Difference (MCID):  =DGI scores of<21/24 = 1.80 points DGI scores of >21/24 = 0.60 points   [place] T, Inbar-Borovsky N, Brozgol M, Giladi N, [place] JM. The Dynamic Gait Index in healthy older adults: the role of stair climbing, fear of falling and gender. Gait Posture. 2009 Feb;29(2):237-41. doi: 10.1016/j.gaitpost.2008.08.013. Epub 2008 Oct 8. PMID: [id number]; PMCID: EFR7290501.  Pardasaney, MYRTIS LOIS Bonus, GEANNIE POUR., et al. (2012). Sensitivity to change and responsiveness of four balance measures for community-dwelling older adults. Physical therapy 92(3): 388-397.    GAIT: Distance walked: 40 ft Assistive device utilized: Single point cane Level of assistance: CGA Comments: antalgic, SPC on R hand,  decreased stance L LE, decreased B knee extension, decreased step length, shuffling gait pattern whenever starting back up to walk after static position.    Pt tendency to reach too early for chair to sit, causing her center of gravity to leave her base of support.  Cues needed to walk to the chair and not to reach for the chair too early.   Cues for B femoral control to sit with proper hand placement.   Ascending stairs: R rail assist, reciprocal pattern, decreased B femoral control  Descaneind stairs: R rail assist, pt steps down with L LE first. 2 feet to a step, decreased B femoral control .    TREATMENT DATE: 04/20/2024  Gait training  Performed to improve ability to ambulate with better foot clearance and step length.   NuStep seat 5, arms 5, level 1 for 5 minutes to promote reciprocal, alternating movement  Able to maintain 90 + SPM   Therapeutic rest break afterwards to allow for muscle recovery for next task  With L LE: Stepping over 4 mini hurdles with SPC and CGA 4x 2  Cues for getting close to obstacle and improving hip and knee flexion and step length for foot clearance.    Able to clear all hurdles during first set.    Stepped on hurdle 1x with L foot then R foot during second set.   Gait around the gym 200 ft with SPC to promote large movements and foot clearance.    With L and R LE: Stepping over 4 mini hurdles with SPC and CGA 4x2  Cues for getting close to obstacle and improving hip and knee flexion and step length for foot clearance.     Pt was able to clear all hurdles during first  and second sets.   Gait around the gym 200 ft with SPC to promote large movements and foot clearance.     For 2 sets      Therapeutic rest breaks secondary to fatigue.  Improved exercise technique, movement at target joints, use of target muscles  after mod verbal, visual, tactile cues.          PATIENT EDUCATION:  Education details: there-ex, HEP Person educated: Patient Education method: Explanation, Demonstration, Tactile cues, Verbal cues, and Handouts Education comprehension: verbalized understanding, returned demonstration, verbal cues required, tactile cues required, and needs further education  HOME EXERCISE PROGRAM:  Seated hip flexion with opposite shoulder flexion 10x3 each LE to promote coodination and reciprocal movement  Picture provided  Gait training with rw/SPC with son, with emphasis on hip and knee flexion, increase base of support and step length.   Pt and son Salbador) verbalized understanding   Access Code: [email or web address] URL: [email or web address] Date: 03/18/2024 Prepared by: Emil Glassman  Exercises - Sit to Stand with Counter Support  - 3 x daily - 7 x weekly - 1 sets - 5 reps - Seated Long Arc Quad  - 3 x daily - 7 x weekly - 3 sets - 10 reps - 10 seconds hold - Standing March with Counter Support  - 1 x daily - 7 x weekly - 3 sets - 10 reps - Standing Hip Abduction with Counter Support  - 1 x daily - 7 x weekly - 3 sets - 10 reps    ASSESSMENT:  CLINICAL IMPRESSION: Improving ability to clear obstacles observed today. Improved foot clearance and step length overall and with increased practice and cues. Continued working on improving hip and knee flexion and step length to promote foot clearance so as to not trip on obstacles and decrease fall risk.   Pt tolerated session well without aggravation of symptoms. Pt will benefit from continued skilled physical therapy services to improve strength, balance, function, and gait.     OBJECTIVE IMPAIRMENTS: Abnormal gait, decreased balance, difficulty walking, decreased strength, improper body mechanics, and postural dysfunction.   ACTIVITY LIMITATIONS: carrying, lifting, standing, squatting, stairs, and locomotion level  PARTICIPATION  LIMITATIONS:   PERSONAL FACTORS: Age, Fitness, Past/current experiences, Time since onset of injury/illness/exacerbation, and 1-2 comorbidities: HTN, osteoporosis are also affecting patient's functional outcome.   REHAB POTENTIAL: Fair    CLINICAL DECISION MAKING: Stable/uncomplicated  EVALUATION COMPLEXITY: Low   GOALS:  Goals reviewed with patient? Yes  SHORT TERM GOALS: Target date: 01/02/2024  Pt will be independent with her initial HEP to improve strength, femoral control, balance, and function.  Baseline: Pt has not yet started her initial HEP (12/18/2023); Doing her HEP, no questions (02/06/2024) Goal status: MET   LONG TERM GOALS: Target date: 04/23/2024  Pt will improve her Activities-specific Balance Confidence (ABC) Scale by at least 20% as a demonstration of improved balance.  Baseline: ABC scale sore: 36.25%, 36.25% (02/20/2024); unable to assess secondary to multiple N/A answers (04/14/2024) Goal status: ONGOING  2.  Pt will improve her DGI score to 15/24 or more as a demonstration of improved balance.  Baseline: DGI score: 7/24 (12/18/2023); 6/24 (02/06/2024); 9/24 (02/20/2024); 13/24 (04/05/2024) Goal status: PROGRESSING  3.  Pt will improve her B hip flexion, extension and abduction strength by at least 1/2 MMT to improve ability to ambulate, perform standing tasks more safely and with less difficulty.   Baseline:  MMT Right eval Left eval R (02/06/2024) L (02/06/2024) R  (02/20/2024) L (02/20/2024) R (04/14/2024)   Hip flexion 4 4 at available range with L lateral thigh pulling  4+ 4+ 4+ 5    Hip extension (seated manually resisted) 4 3 4 4 4  4+ 4+   Hip abduction (seated manually resisted) 4 4- 4+ 4 5 5      (12/18/2023)  Goal status: MET     PLAN:  PT FREQUENCY: 1-2x/week  PT DURATION: other: 1 week  PLANNED INTERVENTIONS: 97110-Therapeutic exercises, 97530- Therapeutic activity, 97112- Neuromuscular re-education, 97535- Self Care, [id number]- Manual therapy,  [id number]- Gait training, [id number]- Canalith repositioning, [id number]- Electrical stimulation (unattended), Patient/Family education, Balance training, Stair training, and Vestibular training.  PLAN FOR NEXT SESSION: placing and maintaining center of gravity over her base of support, gait training, trunk and LE strengthening, proper mechanics, manual techniques, modalities PRN.    Calisa Luckenbaugh, PT, DPT 04/20/2024, 12:10 PM

## 2024-04-21 ENCOUNTER — Ambulatory Visit: Payer: Self-pay | Admitting: Family Medicine

## 2024-04-21 DIAGNOSIS — G3184 Mild cognitive impairment, so stated: Secondary | ICD-10-CM

## 2024-04-21 DIAGNOSIS — R413 Other amnesia: Secondary | ICD-10-CM

## 2024-04-21 MED ORDER — DONEPEZIL HCL 5 MG PO TABS: 5.0000 mg | ORAL_TABLET | Freq: Every day | ORAL | 0 refills | Status: AC

## 2024-04-23 ENCOUNTER — Ambulatory Visit

## 2024-04-23 DIAGNOSIS — M6281 Muscle weakness (generalized): Secondary | ICD-10-CM | POA: Diagnosis not present

## 2024-04-23 DIAGNOSIS — R2681 Unsteadiness on feet: Secondary | ICD-10-CM | POA: Diagnosis not present

## 2024-04-23 DIAGNOSIS — Z9181 History of falling: Secondary | ICD-10-CM | POA: Diagnosis not present

## 2024-04-23 DIAGNOSIS — R262 Difficulty in walking, not elsewhere classified: Secondary | ICD-10-CM

## 2024-04-23 NOTE — Therapy (Signed)
 OUTPATIENT PHYSICAL THERAPY TREATMENT And Discharge Summary     Patient Name: Anna Johnson MRN: [id number] DOB:1940/12/25, 83 y.o., female Today's Date: 04/23/2024  END OF SESSION:  PT End of Session - 04/23/24 1031     Visit Number 22    Number of Visits 31    Date for PT Re-Evaluation 04/23/24    PT Start Time 1031    PT Stop Time 1111    PT Time Calculation (min) 40 min    Equipment Utilized During Treatment Gait belt    Activity Tolerance Patient tolerated treatment well    Behavior During Therapy [facility] for tasks assessed/performed                              Past Medical History:  Diagnosis Date   Allergy    Glaucoma    Hyperlipidemia    Hypertension    Osteoporosis    Pneumonia 03/29/2015   Past Surgical History:  Procedure Laterality Date   BREAST BIOPSY Left 03/16/2018   Affirm Biopsy- Ribbon clip- positive   BREAST LUMPECTOMY Left 04/2018   CATARACT EXTRACTION  2004   DILATION AND CURETTAGE OF UTERUS     HIP FRACTURE SURGERY  09/15/2010   left hip   WRIST FRACTURE SURGERY     ORIF   Patient Active Problem List   Diagnosis Date Noted   Chronic midline low back pain without sciatica 02/28/2021   Osteoarthritis of both shoulders 02/28/2021   Glaucoma of both eyes 04/06/2018   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 (HCC) 04/03/2018   Thoracic, thoracolumbar and lumbosacral intervertebral disc disorder 05/08/2015   Osteoporosis 04/07/2015   Vitamin D  deficiency 03/29/2015   Open-angle glaucoma 03/29/2015   Hypertension 03/29/2015   Seasonal allergies 03/29/2015   Osteoarthritis 03/29/2015   Fibrocystic breast disease 03/29/2015   Compression fracture of L1 lumbar vertebra (HCC) 03/29/2015   History of hip fracture 03/29/2015   Hyperlipemia 02/28/2015    PCP: Gasper Nancyann BRAVO, MD   REFERRING PROVIDER: Gasper Nancyann BRAVO, MD   REFERRING DIAG: R26.9 (ICD-10-CM) - Gait disturbance  Rationale for  Evaluation and Treatment: Rehabilitation  THERAPY DIAG:  Unsteadiness on feet  Difficulty in walking, not elsewhere classified  Muscle weakness (generalized)  History of falling  ONSET DATE: September 17, 2023 (pt fell backwards reaching for bed post)  SUBJECTIVE:  SUBJECTIVE STATEMENT: Walking is doing good.    PERTINENT HISTORY:  Gait disturbance. Pt got up in the middle of the night to go to the bathroom. Pt reached towards her bed but missed and feel onto her back, pt states she reached too early. Felt fine afterwards except 2 days with B LE discomfort. LE is now fine but L LE just feels awkward. Has a hx of L hip fx S/P surgery 15 years ago. Pt states not having pain or anything but feels like she needs exercise.    Blood pressure is controlled per pt. No latex allergies     PAIN:  None   PRECAUTIONS: Fall  RED FLAGS: No known red flags   WEIGHT BEARING RESTRICTIONS: No  FALLS:  Has patient fallen in last 6 months? Yes. Number of falls 1 first week in September 16, 2023.   LIVING ENVIRONMENT: Lives with: lives with their spouse Lives in: House/apartment Stairs: Yes: Internal: 15 steps; on right going up and External: 5 steps; bilateral but cannot reach both Has following equipment at home: Single point cane, Walker - 4 wheeled, and shower chair  OCCUPATION: retired  PLOF: Independent with household mobility with device  PATIENT GOALS: improve balance, improve ability to ambulate safely.   NEXT MD VISIT:   OBJECTIVE:  Note: Objective measures were completed at Evaluation unless otherwise noted.  DIAGNOSTIC FINDINGS:  DG Ribs Unilateral Right 09/19/2023  Narrative & Impression  CLINICAL DATA:  Post fall, now with right-sided rib pain.   EXAM: RIGHT RIBS - 2 VIEW    COMPARISON:  Chest radiograph-09/18/2012   FINDINGS: Unchanged cardiac silhouette and mediastinal contours with atherosclerotic plaque within the thoracic aorta. No focal parenchymal opacities. No pleural effusion or pneumothorax. No evidence of edema.   No definite displaced right-sided rib fractures. Regional soft tissues appear normal. Surgical clips overlie the left breast.   IMPRESSION: 1. No definite displaced right-sided rib fractures. 2. No acute cardiopulmonary disease. 3. Aortic Atherosclerosis (ICD10-I70.0).     Electronically Signed   By: Norleen Roulette M.D.   On: 09/21/2023 16:40      PATIENT SURVEYS:  ABC scale: 36.25 % (12/18/2023)  COGNITION: Overall cognitive status: Within functional limits for tasks assessed     SENSATION:   MUSCLE LENGTH:   POSTURE: forward flexed, decreased B knee extension  PALPATION:   LUMBAR ROM:   AROM eval  Flexion   Extension   Right lateral flexion   Left lateral flexion   Right rotation   Left rotation    (Blank rows = not tested)  LOWER EXTREMITY ROM:     Passive  Right eval Left eval  Hip flexion    Hip extension    Hip abduction    Hip adduction    Hip internal rotation    Hip external rotation    Knee flexion    Knee extension    Ankle dorsiflexion    Ankle plantarflexion    Ankle inversion    Ankle eversion     (Blank rows = not tested)  LOWER EXTREMITY MMT:    MMT Right eval Left eval  Hip flexion 4 4 at available range with L lateral thigh pulling   Hip extension (seated manually resisted) 4 3  Hip abduction (seated manually resisted) 4 4-  Hip adduction    Hip internal rotation    Hip external rotation    Knee flexion 5 4  Knee extension 5 5  Ankle dorsiflexion    Ankle  plantarflexion    Ankle inversion    Ankle eversion     (Blank rows = not tested)  LUMBAR SPECIAL TESTS:    FUNCTIONAL TESTS:  DGI: 7/24 Dynamic Gait Index  Mark the lowest level that applies.   Date  Performed 04/05/2024  Gait level surface (2) Mild Impairment: Walks 20', uses AD, slower speed, mild gait deviations  2. Change in gait speed (2) Mild Impairment: Is able to change speed but demonstrates mild gait deviations, or not gait deviations but unable to achieve a significant change in velocity, or uses an assistive device  3. Gait with horizontal head turns (2) Mild Impairment: Performs head turns smoothly with slight change in gait velocity, i.e., minor disruption to smooth gait path or uses walking aid  4. Gait with vertical head turns (2) Mild Impairment: Performs head turns smoothly with slight change in gait velocity, i.e., minor disruption to smooth gait path or uses walking aid  5. Gait and pivot turn (2) Mild Impairment: Pivot turns safely in > 3 seconds and stops with no loss of balance  6. Step over obstacle (0) Severe Impairment: Cannot perform without assistance  7. Step around obstacle (1) Moderate Impairment: Is able to clear cones but must significantly slow, speed to accomplish task, or requires verbal cueing  8. Steps (2) Mild Impairment: Alternating feet, must use rail  Total score 13/24    Score Interpretation: Score of <19 indicates high risk of falls.  Minimally Clinically Important Difference (MCID):  =DGI scores of<21/24 = 1.80 points DGI scores of >21/24 = 0.60 points   [place] T, Inbar-Borovsky N, Brozgol M, Giladi N, [place] JM. The Dynamic Gait Index in healthy older adults: the role of stair climbing, fear of falling and gender. Gait Posture. 2009 Feb;29(2):237-41. doi: 10.1016/j.gaitpost.2008.08.013. Epub 2008 Oct 8. PMID: 81154560; PMCID: EFR7290501.  Pardasaney, MYRTIS LOIS Bonus, GEANNIE POUR., et al. (2012). Sensitivity to change and responsiveness of four balance measures for community-dwelling older adults. Physical therapy 92(3): 388-397.    GAIT: Distance walked: 40 ft Assistive device utilized: Single point cane Level of assistance: CGA Comments:  antalgic, SPC on R hand, decreased stance L LE, decreased B knee extension, decreased step length, shuffling gait pattern whenever starting back up to walk after static position.    Pt tendency to reach too early for chair to sit, causing her center of gravity to leave her base of support.  Cues needed to walk to the chair and not to reach for the chair too early.   Cues for B femoral control to sit with proper hand placement.   Ascending stairs: R rail assist, reciprocal pattern, decreased B femoral control  Descaneind stairs: R rail assist, pt steps down with L LE first. 2 feet to a step, decreased B femoral control .    TREATMENT DATE: 04/23/2024  Gait training  Performed to improve ability to ambulate with better foot clearance and step length.   NuStep seat 5, arms 5, level 1 for 5 minutes to promote reciprocal, alternating movement  Able to maintain 90 + SPM for 4 minutes    Therapeutic rest break afterwards to allow for muscle recovery for next task  Gait around the gym 200 ft with SPC to promote large movements and foot clearance. CGA the SBA   Then with stepping over 4 mini hurdles (spread out throughout each 100 ft lap)   200 ft CGA for 2 sets  Stepping over 8 mini hurdles 2x2  CGA   Stepped on 1 mini hurdle 2x during first set  Side stepping 30 ft to the R and 30 ft to the L with CGA   Gait around the gym 100 ft with SPC to promote large movements and foot clearance.       Therapeutic rest breaks secondary to fatigue.  Improved exercise technique, movement at target joints, use of target muscles after mod verbal, visual, tactile cues.          PATIENT EDUCATION:  Education details: there-ex, HEP Person educated: Patient Education method: Explanation, Demonstration, Tactile cues, Verbal cues, and Handouts Education comprehension:  verbalized understanding, returned demonstration, verbal cues required, tactile cues required, and needs further education  HOME EXERCISE PROGRAM:  Seated hip flexion with opposite shoulder flexion 10x3 each LE to promote coodination and reciprocal movement  Picture provided  Gait training with rw/SPC with son, with emphasis on hip and knee flexion, increase base of support and step length.   Pt and son Salbador) verbalized understanding   Access Code: [email or web address] URL: [email or web address] Date: 03/18/2024 Prepared by: Emil Glassman  Exercises - Sit to Stand with Counter Support  - 3 x daily - 7 x weekly - 1 sets - 5 reps - Seated Long Arc Quad  - 3 x daily - 7 x weekly - 3 sets - 10 reps - 10 seconds hold - Standing March with Counter Support  - 1 x daily - 7 x weekly - 3 sets - 10 reps - Standing Hip Abduction with Counter Support  - 1 x daily - 7 x weekly - 3 sets - 10 reps    ASSESSMENT:  CLINICAL IMPRESSION: Continued working on gait mechanics and obstacle negotiation to help decrease fall risk at home. Pt demonstrates overall improved hip and knee flexion and step length during gait with decreased shuffling as well as improved DGI and improved B hip strength since initial evaluation. Pt has made progress with PT towards goals. Skilled physical therapy services discharged with pt continuing progress with her exercises at home.        OBJECTIVE IMPAIRMENTS: Abnormal gait, decreased balance, difficulty walking, decreased strength, improper body mechanics, and postural dysfunction.   ACTIVITY LIMITATIONS: carrying, lifting, standing, squatting, stairs, and locomotion level  PARTICIPATION LIMITATIONS:   PERSONAL FACTORS: Age, Fitness, Past/current experiences, Time since onset of injury/illness/exacerbation, and 1-2 comorbidities: HTN, osteoporosis are also affecting patient's functional outcome.   REHAB POTENTIAL: Fair    CLINICAL DECISION MAKING:  Stable/uncomplicated  EVALUATION COMPLEXITY: Low   GOALS: Goals reviewed with patient? Yes  SHORT TERM GOALS: Target date: 01/02/2024  Pt will be independent with her initial HEP to improve strength, femoral control, balance, and function.  Baseline: Pt has not yet started her initial HEP (12/18/2023); Doing her HEP, no questions (02/06/2024) Goal status: MET   LONG TERM GOALS: Target date: 04/23/2024  Pt will improve her Activities-specific Balance Confidence (ABC) Scale by at least 20% as a demonstration of improved balance.  Baseline: ABC scale sore: 36.25%, 36.25% (02/20/2024); unable to assess secondary to multiple N/A answers (04/14/2024) Goal status: ONGOING  2.  Pt will improve her DGI score to 15/24 or more as a demonstration of improved balance.  Baseline: DGI score: 7/24 (12/18/2023); 6/24 (02/06/2024); 9/24 (02/20/2024); 13/24 (04/05/2024) Goal status: PROGRESSING  3.  Pt will improve her B hip flexion, extension and abduction strength by at least 1/2 MMT to improve ability to ambulate, perform standing tasks more safely and with less difficulty.   Baseline:  MMT Right eval Left eval R (02/06/2024) L (02/06/2024) R  (02/20/2024) L (02/20/2024) R (04/14/2024)   Hip flexion 4 4 at available range with L lateral thigh pulling  4+ 4+ 4+ 5    Hip extension (seated manually resisted) 4 3 4 4 4  4+ 4+   Hip abduction (seated manually resisted) 4 4- 4+ 4 5 5      (12/18/2023)  Goal status: MET     PLAN:  PT FREQUENCY: 1-2x/week  PT DURATION: other: 1 week  PLANNED INTERVENTIONS: 97110-Therapeutic exercises, 97530- Therapeutic activity, 97112- Neuromuscular re-education, 97535- Self Care, [id number]- Manual therapy, [id number]- Gait training, [phone]- Canalith repositioning, [id number]- Electrical stimulation (unattended), Patient/Family education, Balance training, Stair training, and Vestibular training.  PLAN FOR NEXT SESSION: placing and maintaining center of gravity over her base of support,  gait training, trunk and LE strengthening, proper mechanics, manual techniques, modalities PRN.   Thank you for your referral.  Sumaiyah Markert, PT, DPT 04/23/2024, 11:16 AM

## 2024-05-04 ENCOUNTER — Ambulatory Visit (INDEPENDENT_AMBULATORY_CARE_PROVIDER_SITE_OTHER): Payer: Medicare PPO

## 2024-05-04 VITALS — BP 120/70 | Ht 62.0 in | Wt 106.8 lb

## 2024-05-04 DIAGNOSIS — Z0000 Encounter for general adult medical examination without abnormal findings: Secondary | ICD-10-CM | POA: Diagnosis not present

## 2024-05-04 DIAGNOSIS — Z780 Asymptomatic menopausal state: Secondary | ICD-10-CM

## 2024-05-04 NOTE — Patient Instructions (Addendum)
 Ms. Anna Johnson,  Thank you for taking the time for your Medicare Wellness Visit. I appreciate your continued commitment to your health goals. Please review the care plan we discussed, and feel free to reach out if I can assist you further.  Medicare recommends these wellness visits once per year to help you and your care team stay ahead of potential health issues. These visits are designed to focus on prevention, allowing your provider to concentrate on managing your acute and chronic conditions during your regular appointments.  Please note that Annual Wellness Visits do not include a physical exam. Some assessments may be limited, especially if the visit was conducted virtually. If needed, we may recommend a separate in-person follow-up with your provider.  Ongoing Care Seeing your primary care provider every 3 to 6 months helps us  monitor your health and provide consistent, personalized care.   Referrals If a referral was made during today's visit and you haven't received any updates within two weeks, please contact the referred provider directly to check on the status.  Recommended Screenings:  Health Maintenance  Topic Date Due   Zoster (Shingles) Vaccine (1 of 2) 12/12/1959   DTaP/Tdap/Td vaccine (3 - Td or Tdap) 04/28/2021   DEXA scan (bone density measurement)  11/07/2023   COVID-19 Vaccine (6 - 2025-26 season) 04/12/2024   Medicare Annual Wellness Visit  05/04/2025   Pneumococcal Vaccine for age over 21  Completed   Flu Shot  Completed   HPV Vaccine  Aged Out   Meningitis B Vaccine  Aged Out     Advance Care Planning is important because it: Ensures you receive medical care that aligns with your values, goals, and preferences. Provides guidance to your family and loved ones, reducing the emotional burden of decision-making during critical moments.  Vision: Annual vision screenings are recommended for early detection of glaucoma, cataracts, and diabetic retinopathy. These  exams can also reveal signs of chronic conditions such as diabetes and high blood pressure.  Dental: Annual dental screenings help detect early signs of oral cancer, gum disease, and other conditions linked to overall health, including heart disease and diabetes.  Please see the attached documents for additional preventive care recommendations.   NEXT AWV 05/10/25 @ 11:30 AM IN PERSON

## 2024-05-04 NOTE — Progress Notes (Signed)
 Subjective:   ZEBA LUBY is a 83 y.o. who presents for a Medicare Wellness preventive visit.  As a reminder, Annual Wellness Visits don't include a physical exam, and some assessments may be limited, especially if this visit is performed virtually. We may recommend an in-person follow-up visit with your provider if needed.  Visit Complete: In person  Persons Participating in Visit: Patient.  AWV Questionnaire: No: Patient Medicare AWV questionnaire was not completed prior to this visit.  Cardiac Risk Factors include: advanced age (>88men, >25 women);dyslipidemia;hypertension;sedentary lifestyle     Objective:    Today's Vitals   05/04/24 1120 05/04/24 1126  BP: 120/70   Weight: 106 lb 12.8 oz (48.4 kg)   Height: 5' 2 (1.575 m)   PainSc:  9    Body mass index is 19.53 kg/m.     05/04/2024   11:43 AM 03/18/2024    4:05 PM 12/18/2023   10:48 AM 04/29/2023   10:48 AM 12/28/2019   10:43 AM 12/24/2017    9:19 AM 12/04/2016    1:50 PM  Advanced Directives  Does Patient Have a Medical Advance Directive? No No Yes Yes Yes Yes  No   Type of Chief of Staff of [place];Living will Healthcare Power of [place];Living will Healthcare Power of Attorney   Does patient want to make changes to medical advance directive?   No - Patient declined      Copy of Healthcare Power of Attorney in Chart?     No - copy requested No - copy requested    Would patient like information on creating a medical advance directive? No - Patient declined No - Patient declined     --      Data saved with a previous flowsheet row definition    Current Medications (verified) Outpatient Encounter Medications as of 05/04/2024  Medication Sig   Acetaminophen 500 MG capsule Take 2 capsules by mouth as needed for fever.   amLODipine  (NORVASC ) 5 MG tablet TAKE 1 TABLET BY MOUTH DAILY   aspirin 81 MG tablet Take 81 mg by mouth daily.   benazepril  (LOTENSIN ) 20 MG  tablet TAKE 1 TABLET BY MOUTH DAILY. TAKE IN PLACE OF QUINAPRIL .   donepezil  (ARICEPT ) 5 MG tablet Take 1 tablet (5 mg total) by mouth at bedtime.   dorzolamide-timolol (COSOPT) 22.3-6.8 MG/ML ophthalmic solution    fluticasone  (FLONASE ) 50 MCG/ACT nasal spray USE TWO SPRAYS IN EACH NOSTRIL EACH DAY AS DIRECTED BY PHYSICIAN (Patient taking differently: USE TWO SPRAYS IN EACH NOSTRIL EACH DAY AS DIRECTED BY PHYSICIAN TAKES PRN)   latanoprost (XALATAN) 0.005 % ophthalmic solution Place 1 drop into both eyes at bedtime.   loratadine (CLARITIN) 10 MG tablet Take 10 mg by mouth daily as needed for allergies.   LUMIGAN 0.01 % SOLN Place 1 drop into both eyes at bedtime.    povidone-iodine  (BETADINE ) 10 % external solution Apply 1 Application topically as needed for wound care.   timolol (TIMOPTIC) 0.5 % ophthalmic solution Apply to eye.   alendronate  (FOSAMAX ) 70 MG tablet TAKE 1 TABLET BY MOUTH WEEKLY. TAKE IN THE MORNING WITH 8 OZ OF WATER. DO NOT EAT, DRINK OR LIE DOWN FOR 30 MIN AFTER. (Patient not taking: Reported on 05/04/2024)   amoxicillin (AMOXIL) 500 MG capsule Take 2,000 mg by mouth as directed. Prior to dental procedures (Patient not taking: Reported on 05/04/2024)   cholecalciferol (VITAMIN D ) 1000 UNITS tablet Take 2,000 Units by mouth  daily.  (Patient not taking: Reported on 05/04/2024)   Coenzyme Q10 (CO Q-10) 200 MG CAPS Take 200 mg by mouth daily. (Patient not taking: Reported on 05/04/2024)   hydrochlorothiazide  (HYDRODIURIL ) 25 MG tablet TAKE 1 TABLET BY MOUTH DAILY (Patient not taking: Reported on 05/04/2024)   letrozole (FEMARA) 2.5 MG tablet Take 2.5 mg by mouth daily. (Patient not taking: Reported on 05/04/2024)   lovastatin  (MEVACOR ) 40 MG tablet TAKE 1 TABLET BY MOUTH AT BEDTIME   metoprolol  succinate (TOPROL -XL) 50 MG 24 hr tablet TAKE 1 TABLET BY MOUTH DAILY WITH ANY MEAL OR IMMEDIATELY FOLLOWING A MEAL   No facility-administered encounter medications on file as of 05/04/2024.     Allergies (verified) Codeine, Seasonal ic [cholestatin], and Covera-hs [verapamil]   History: Past Medical History:  Diagnosis Date   Allergy    Glaucoma    Hyperlipidemia    Hypertension    Osteoporosis    Pneumonia 03/29/2015   Past Surgical History:  Procedure Laterality Date   BREAST BIOPSY Left 03/16/2018   Affirm Biopsy- Ribbon clip- positive   BREAST LUMPECTOMY Left 04/2018   CATARACT EXTRACTION  2004   DILATION AND CURETTAGE OF UTERUS     HIP FRACTURE SURGERY  09/15/2010   left hip   WRIST FRACTURE SURGERY     ORIF   Family History  Problem Relation Age of Onset   Uterine cancer Mother    Heart attack Father    Healthy Brother    Cancer Son        colon, died from sx   Breast cancer Neg Hx    Social History   Socioeconomic History   Marital status: Married    Spouse name: Vinie   Number of children: 2   Years of education: [employer] education level: Bachelor's degree (e.g., BA, AB, BS)  Occupational History   Occupation: Retired Runner, broadcasting/film/video  Tobacco Use   Smoking status: Never   Smokeless tobacco: Never  Vaping Use   Vaping status: Never Used  Substance and Sexual Activity   Alcohol use: Yes    Alcohol/week: 1.0 standard drink of alcohol    Types: 1 Glasses of wine per week    Comment: Occasionally   Drug use: No   Sexual activity: Not on file  Other Topics Concern   Not on file  Social History Narrative   Not on file   Social Drivers of Health   Financial Resource Strain: Low Risk  (05/04/2024)   Overall Financial Resource Strain (CARDIA)    Difficulty of Paying Living Expenses: Not hard at all  Food Insecurity: No Food Insecurity (05/04/2024)   Hunger Vital Sign    Worried About Running Out of Food in the Last Year: Never true    Ran Out of Food in the Last Year: Never true  Transportation Needs: No Transportation Needs (05/04/2024)   PRAPARE - Administrator, Civil Service (Medical): No    Lack of Transportation  (Non-Medical): No  Physical Activity: Inactive (05/04/2024)   Exercise Vital Sign    Days of Exercise per Week: 0 days    Minutes of Exercise per Session: 0 min  Stress: No Stress Concern Present (05/04/2024)   [employer] of Occupational Health - Occupational Stress Questionnaire    Feeling of Stress: Only a little  Social Connections: Moderately Isolated (05/04/2024)   Social Connection and Isolation Panel    Frequency of Communication with Friends and Family: More than three times a week  Frequency of Social Gatherings with Friends and Family: More than three times a week    Attends Religious Services: Never    Database administrator or Organizations: No    Attends Engineer, structural: Never    Marital Status: Married    Tobacco Counseling Counseling given: Not Answered    Clinical Intake:  Pre-visit preparation completed: Yes  Pain : 0-10 Pain Score: 9  Pain Type: Acute pain Pain Location: Coccyx Pain Orientation: Right Pain Radiating Towards: right side/hip Pain Descriptors / Indicators: Aching, Constant, Sore Pain Onset: In the past 7 days Pain Frequency: Constant     BMI - recorded: 19.53 Nutritional Status: BMI of 19-24  Normal Nutritional Risks: Unintentional weight loss Diabetes: No  Lab Results  Component Value Date   HGBA1C 5.5 06/11/2017   HGBA1C 5.8 12/04/2016   HGBA1C 5.7 (H) 05/31/2016     How often do you need to have someone help you when you read instructions, pamphlets, or other written materials from your doctor or pharmacy?: 1 - Never  Interpreter Needed?: No  Information entered by :: JHONNIE DAS, LPN   Activities of Daily Living     05/04/2024   11:44 AM  In your present state of health, do you have any difficulty performing the following activities:  Hearing? 0  Vision? 0  Difficulty concentrating or making decisions? 1  Comment MEMORY  Walking or climbing stairs? 1  Dressing or bathing? 0  Doing errands,  shopping? 1  Preparing Food and eating ? N  Using the Toilet? N  In the past six months, have you accidently leaked urine? N  Do you have problems with loss of bowel control? N  Managing your Medications? N  Managing your Finances? N  Housekeeping or managing your Housekeeping? N    Patient Care Team: Gasper Nancyann BRAVO, MD as PCP - General (Family Medicine) Octavia Bruckner, MD as Consulting Physician (Ophthalmology) Rodger Betters, DDS as Consulting Physician (Dentistry) Blair Mt, MD as Referring Physician (Otolaryngology) Loreda Hacker, DPM as Consulting Physician (Podiatry) Joshua Sieving, MD as Consulting Physician (Dermatology)  I have updated your Care Teams any recent Medical Services you may have received from other providers in the past year.     Assessment:   This is a routine wellness examination for Mena.  Hearing/Vision screen Hearing Screening - Comments:: NO AIDS Vision Screening - Comments:: WEARS GLASSES ALL DAY- DR.GROAT   Goals Addressed             This Visit's Progress    DIET - EAT MORE FRUITS AND VEGETABLES         Depression Screen     05/04/2024   11:40 AM 03/18/2024    1:21 PM 12/12/2023    1:37 PM 09/19/2023    1:30 PM 07/02/2023    9:53 AM 04/29/2023   10:46 AM 01/01/2023   10:04 AM  PHQ 2/9 Scores  PHQ - 2 Score 1 1 1  0 0 0 0  PHQ- 9 Score 1 2   0      Fall Risk     05/04/2024   11:43 AM 03/18/2024    2:13 PM 03/18/2024    1:21 PM 12/12/2023    1:37 PM 09/19/2023    1:29 PM  Fall Risk   Falls in the past year? 1 0 1 0 1  Number falls in past yr: 1 0 0 0 0  Injury with Fall? 0 0 0 1 0  Comment  leg   Risk for fall due to : Impaired balance/gait;Impaired mobility;History of fall(s)    Impaired balance/gait  Follow up Falls evaluation completed;Falls prevention discussed        MEDICARE RISK AT HOME:  Medicare Risk at Home Any stairs in or around the home?: Yes If so, are there any without handrails?: No Home free of loose throw  rugs in walkways, pet beds, electrical cords, etc?: Yes Adequate lighting in your home to reduce risk of falls?: Yes Life alert?: No Use of a cane, walker or w/c?: Yes (USE CANE & FURNITURE WALKS AT HOME- WHEN OUT OF HOUSE USES CANE OR W/C) Grab bars in the bathroom?: No Shower chair or bench in shower?: Yes Elevated toilet seat or a handicapped toilet?: Yes  TIMED UP AND GO:  Was the test performed?  Yes  Length of time to ambulate 10 feet: 7 sec Gait slow and steady with assistive device  Cognitive Function: 6CIT completed        05/04/2024   11:48 AM 04/19/2024    2:45 PM 04/29/2023   10:49 AM 12/28/2019   10:49 AM 12/24/2017    9:21 AM  6CIT Screen  What Year? 0 points 0 points 0 points 0 points 0 points  What month? 0 points 0 points 0 points 0 points 0 points  What time? 0 points 0 points 0 points 0 points 0 points  Count back from 20 0 points 0 points 0 points 0 points 0 points  Months in reverse 0 points 0 points 0 points 0 points 0 points  Repeat phrase 6 points 6 points 0 points 0 points 0 points  Total Score 6 points 6 points 0 points 0 points 0 points    Immunizations Immunization History  Administered Date(s) Administered   Fluad Quad(high Dose 65+) 05/26/2019, 06/20/2020, 06/27/2021, 06/28/2022   Fluad Trivalent(High Dose 65+) 04/29/2023   INFLUENZA, HIGH DOSE SEASONAL PF 05/08/2015, 05/30/2016, 06/10/2017, 07/01/2018, 04/19/2024   Influenza Split 04/29/2011, 05/04/2012   Influenza,inj,Quad PF,6+ Mos 05/03/2013, 04/27/2014   PFIZER Comirnaty(Gray Top)Covid-19 Tri-Sucrose Vaccine 09/17/2019, 10/08/2019   PFIZER(Purple Top)SARS-COV-2 Vaccination 09/17/2019, 10/08/2019, 07/19/2020   Pneumococcal Conjugate-13 11/03/2013   Pneumococcal Polysaccharide-23 06/04/2008, 07/01/2018   Td 04/13/2008   Tdap 04/29/2011   Zoster, Live 04/13/2008    Screening Tests Health Maintenance  Topic Date Due   Zoster Vaccines- Shingrix (1 of 2) 12/12/1959   DTaP/Tdap/Td (3 - Td  or Tdap) 04/28/2021   DEXA SCAN  11/07/2023   COVID-19 Vaccine (6 - 2025-26 season) 04/12/2024   Medicare Annual Wellness (AWV)  05/04/2025   Pneumococcal Vaccine: 50+ Years  Completed   Influenza Vaccine  Completed   HPV VACCINES  Aged Out   Meningococcal B Vaccine  Aged Out    Health Maintenance Items Addressed: BDS ORDERED; AGED OUT OF COLONOSCOPY & MAMMOGRAM; NEEDS TDAP & COVID  Additional Screening:  Vision Screening: Recommended annual ophthalmology exams for early detection of glaucoma and other disorders of the eye. Is the patient up to date with their annual eye exam?  Yes  Who is the provider or what is the name of the office in which the patient attends annual eye exams? DR.GROAT  Dental Screening: Recommended annual dental exams for proper oral hygiene  Community Resource Referral / Chronic Care Management: CRR required this visit?  No   CCM required this visit?  No   Plan:    I have personally reviewed and noted the following in the patient's chart:  Medical and social history Use of alcohol, tobacco or illicit drugs  Current medications and supplements including opioid prescriptions. Patient is not currently taking opioid prescriptions. Functional ability and status Nutritional status Physical activity Advanced directives List of other physicians Hospitalizations, surgeries, and ER visits in previous 12 months Vitals Screenings to include cognitive, depression, and falls Referrals and appointments  In addition, I have reviewed and discussed with patient certain preventive protocols, quality metrics, and best practice recommendations. A written personalized care plan for preventive services as well as general preventive health recommendations were provided to patient.   Jhonnie GORMAN Das, LPN   [id number]   After Visit Summary: (In Person-Declined) Patient declined AVS at this time.  Notes: BDS ORDERED

## 2024-05-05 ENCOUNTER — Ambulatory Visit
Admission: RE | Admit: 2024-05-05 | Discharge: 2024-05-05 | Disposition: A | Discharge status: Home / Self Care | Source: Ambulatory Visit | Attending: Family Medicine | Admitting: Family Medicine

## 2024-05-05 ENCOUNTER — Ambulatory Visit: Payer: Self-pay | Admitting: Family Medicine

## 2024-05-05 ENCOUNTER — Ambulatory Visit
Admission: RE | Admit: 2024-05-05 | Discharge: 2024-05-05 | Disposition: A | Discharge status: Home / Self Care | Attending: Family Medicine | Admitting: Family Medicine

## 2024-05-05 ENCOUNTER — Ambulatory Visit (INDEPENDENT_AMBULATORY_CARE_PROVIDER_SITE_OTHER): Admitting: Family Medicine

## 2024-05-05 ENCOUNTER — Encounter: Payer: Self-pay | Admitting: Family Medicine

## 2024-05-05 VITALS — BP 136/53 | HR 60 | Temp 97.9°F | Ht 62.0 in | Wt 106.5 lb

## 2024-05-05 DIAGNOSIS — M1611 Unilateral primary osteoarthritis, right hip: Secondary | ICD-10-CM | POA: Diagnosis not present

## 2024-05-05 DIAGNOSIS — S32048A Other fracture of fourth lumbar vertebra, initial encounter for closed fracture: Secondary | ICD-10-CM | POA: Diagnosis not present

## 2024-05-05 DIAGNOSIS — M545 Low back pain, unspecified: Secondary | ICD-10-CM | POA: Diagnosis not present

## 2024-05-05 DIAGNOSIS — M419 Scoliosis, unspecified: Secondary | ICD-10-CM | POA: Insufficient documentation

## 2024-05-05 DIAGNOSIS — M25551 Pain in right hip: Secondary | ICD-10-CM | POA: Insufficient documentation

## 2024-05-05 DIAGNOSIS — M4855XA Collapsed vertebra, not elsewhere classified, thoracolumbar region, initial encounter for fracture: Secondary | ICD-10-CM | POA: Diagnosis not present

## 2024-05-05 DIAGNOSIS — W19XXXA Unspecified fall, initial encounter: Secondary | ICD-10-CM | POA: Insufficient documentation

## 2024-05-05 DIAGNOSIS — M47816 Spondylosis without myelopathy or radiculopathy, lumbar region: Secondary | ICD-10-CM | POA: Insufficient documentation

## 2024-05-05 DIAGNOSIS — Z96642 Presence of left artificial hip joint: Secondary | ICD-10-CM | POA: Insufficient documentation

## 2024-05-05 DIAGNOSIS — M8008XA Age-related osteoporosis with current pathological fracture, vertebra(e), initial encounter for fracture: Secondary | ICD-10-CM

## 2024-05-05 DIAGNOSIS — R3915 Urgency of urination: Secondary | ICD-10-CM | POA: Diagnosis not present

## 2024-05-05 DIAGNOSIS — M79651 Pain in right thigh: Secondary | ICD-10-CM | POA: Diagnosis not present

## 2024-05-05 DIAGNOSIS — N3091 Cystitis, unspecified with hematuria: Secondary | ICD-10-CM

## 2024-05-05 DIAGNOSIS — S32038A Other fracture of third lumbar vertebra, initial encounter for closed fracture: Secondary | ICD-10-CM | POA: Insufficient documentation

## 2024-05-05 DIAGNOSIS — M4186 Other forms of scoliosis, lumbar region: Secondary | ICD-10-CM | POA: Diagnosis not present

## 2024-05-05 DIAGNOSIS — M810 Age-related osteoporosis without current pathological fracture: Secondary | ICD-10-CM | POA: Diagnosis not present

## 2024-05-05 DIAGNOSIS — M79604 Pain in right leg: Secondary | ICD-10-CM | POA: Diagnosis not present

## 2024-05-05 DIAGNOSIS — M549 Dorsalgia, unspecified: Secondary | ICD-10-CM | POA: Diagnosis not present

## 2024-05-05 LAB — POCT URINALYSIS DIPSTICK
Bilirubin, UA: NEGATIVE
Glucose, UA: NEGATIVE
Ketones, UA: NEGATIVE
Nitrite, UA: NEGATIVE
Protein, UA: POSITIVE — AB
Spec Grav, UA: 1.025 (ref 1.010–1.025)
Urobilinogen, UA: 0.2 U/dL
pH, UA: 5 (ref 5.0–8.0)

## 2024-05-05 MED ORDER — CEPHALEXIN 500 MG PO CAPS: 500.0000 mg | ORAL_CAPSULE | Freq: Three times a day (TID) | ORAL | 0 refills | Status: AC

## 2024-05-05 MED ORDER — CALCITONIN (SALMON) 200 UNIT/ACT NA SOLN: 1.0000 | Freq: Every day | NASAL | 0 refills | Status: DC

## 2024-05-05 NOTE — Progress Notes (Signed)
 ACUTE VISIT   Patient: Anna Johnson   DOB: [birth year]   83 y.o. Female  MRN: [id number]   PCP: Gasper Nancyann BRAVO, MD  Chief Complaint  Patient presents with  . Acute Visit    -Patient is present she believes that she has a UTI. -Urinary urgency in the middle of the night. [place] about a week ago and hit back on the step from where she was sitting putting her shoes on.  Pain on both sides of lower back. -Also states that her right leg is bothering her.  No bruises.  Recent medication change not sure if that is the side effects to the medication.   Subjective    HPI HPI     Acute Visit    Additional comments: -Patient is present she believes that she has a UTI. -Urinary urgency in the middle of the night. [place] about a week ago and hit back on the step from where she was sitting putting her shoes on.  Pain on both sides of lower back. -Also states that her right leg is bothering her.  No bruises.  Recent medication change not sure if that is the side effects to the medication.      Last edited by Terrel Powell CROME, CMA on 05/05/2024 11:18 AM.       Discussed the use of AI scribe software for clinical note transcription with the patient, who gave verbal consent to proceed.  History of Present Illness Anna Johnson is an 83 year old female with osteoporosis and intervertebral disc disorder who presents with urinary urgency and back pain following a fall.  She has been experiencing urinary urgency, particularly nocturnally, for the past two weeks. There is no visible hematuria, but she has difficulty reaching the toilet in time, leading to occasional urinary incontinence. A recent urinalysis showed trace blood, trace protein, urobilin, and leukocytes, but no nitrites.  A week ago, she fell while descending stairs, impacting her lower back on a step. Since then, she has experienced pain in her lower back, especially on the right side, and discomfort in her right leg. The  pain began two days post-fall and worsened over the weekend. No bruising or abdominal pain is reported. Her medical history includes chronic back pain and a compression fracture of L1.  She recently completed physical therapy for her legs following a fall in February. Since the recent fall, she has had difficulty ambulating and increased leg pain, particularly in the right leg. Her physical therapist mentioned that her difficulty with leg mobility could be related to a brain issue, according to the patient's recollection, and her podiatrist told her it was not neuropathy because she did not have numbness.  Her current medications include amlodipine  5 mg, aspirin 81 mg, benazepril  20 mg, Aricept  5 mg, Flonase , Claritin, lovastatin  40 mg, and metoprolol  50 mg. She started Aricept  two weeks ago and has been on metoprolol  for a long time. She is allergic to codeine, verapamil, and cilastatin.     Medications: Outpatient Medications Prior to Visit  Medication Sig  . Acetaminophen 500 MG capsule Take 2 capsules by mouth as needed for fever.  . amLODipine  (NORVASC ) 5 MG tablet TAKE 1 TABLET BY MOUTH DAILY  . aspirin 81 MG tablet Take 81 mg by mouth daily.  . benazepril  (LOTENSIN ) 20 MG tablet TAKE 1 TABLET BY MOUTH DAILY. TAKE IN PLACE OF QUINAPRIL .  . donepezil  (ARICEPT ) 5 MG tablet Take 1 tablet (5 mg  total) by mouth at bedtime.  . dorzolamide-timolol (COSOPT) 22.3-6.8 MG/ML ophthalmic solution   . fluticasone  (FLONASE ) 50 MCG/ACT nasal spray USE TWO SPRAYS IN EACH NOSTRIL EACH DAY AS DIRECTED BY PHYSICIAN (Patient taking differently: Place 2 sprays into both nostrils as needed for allergies. USE TWO SPRAYS IN EACH NOSTRIL EACH DAY AS DIRECTED BY PHYSICIAN TAKES PRN)  . latanoprost (XALATAN) 0.005 % ophthalmic solution Place 1 drop into both eyes at bedtime.  SABRA loratadine (CLARITIN) 10 MG tablet Take 10 mg by mouth daily as needed for allergies.  . lovastatin  (MEVACOR ) 40 MG tablet TAKE 1 TABLET BY  MOUTH AT BEDTIME  . LUMIGAN 0.01 % SOLN Place 1 drop into both eyes at bedtime.   . metoprolol  succinate (TOPROL -XL) 50 MG 24 hr tablet TAKE 1 TABLET BY MOUTH DAILY WITH ANY MEAL OR IMMEDIATELY FOLLOWING A MEAL  . povidone-iodine  (BETADINE ) 10 % external solution Apply 1 Application topically as needed for wound care.  . timolol (TIMOPTIC) 0.5 % ophthalmic solution Apply to eye.  . [DISCONTINUED] alendronate  (FOSAMAX ) 70 MG tablet TAKE 1 TABLET BY MOUTH WEEKLY. TAKE IN THE MORNING WITH 8 OZ OF WATER. DO NOT EAT, DRINK OR LIE DOWN FOR 30 MIN AFTER. (Patient not taking: Reported on 05/04/2024)  . [DISCONTINUED] amoxicillin (AMOXIL) 500 MG capsule Take 2,000 mg by mouth as directed. Prior to dental procedures (Patient not taking: Reported on 05/04/2024)  . [DISCONTINUED] cholecalciferol (VITAMIN D ) 1000 UNITS tablet Take 2,000 Units by mouth daily.  (Patient not taking: Reported on 05/04/2024)  . [DISCONTINUED] Coenzyme Q10 (CO Q-10) 200 MG CAPS Take 200 mg by mouth daily. (Patient not taking: Reported on 05/04/2024)  . [DISCONTINUED] hydrochlorothiazide  (HYDRODIURIL ) 25 MG tablet TAKE 1 TABLET BY MOUTH DAILY (Patient not taking: Reported on 05/04/2024)  . [DISCONTINUED] letrozole (FEMARA) 2.5 MG tablet Take 2.5 mg by mouth daily. (Patient not taking: Reported on 05/04/2024)   No facility-administered medications prior to visit.    {Insert previous labs (optional):23779} {See past labs  Heme  Chem  Endocrine  Serology  Results Review (optional):1}   Objective    BP (!) 136/53 (BP Location: Right Arm, Patient Position: Sitting, Cuff Size: Normal)   Pulse 60   Temp 97.9 F (36.6 C) (Oral)   Ht 5' 2 (1.575 m)   Wt 106 lb 8 oz (48.3 kg)   SpO2 100%   BMI 19.48 kg/m  {Insert last BP/Wt (optional):23777}{See vitals history (optional):1}  Physical Exam   Physical Exam GENERAL: Elderly female, no acute distress. MUSCULOSKELETAL: Tenderness on palpation of right lumbar region. Normal range of  motion of lower extremities bilaterally. Tenderness in right thigh.   No results found for any visits on 05/05/24.  Assessment & Plan     Assessment and Plan Assessment & Plan Acute cystitis with hematuria Urinary urgency and moderate hematuria. Urinalysis shows trace blood, protein, urobilin, and leukocytes, but no nitrites. Empirical treatment for bladder infection initiated. Awaiting urine culture and microscopy results to confirm diagnosis and guide further treatment. - Order urine culture and microscopy - Prescribe Keflex  500 mg three times a day for 7 days - Adjust antibiotic treatment based on culture results if necessary  Urinary urgency and incontinence Experiencing urinary urgency and incontinence, particularly at night, for the past two weeks. No gross hematuria observed. Symptoms may be related to acute cystitis.  Right lumbar back pain and right thigh pain following fall Right lumbar back pain and right thigh pain following a fall. Tenderness in the lumbar region  and right thigh. No acute distress. - Order x-rays of the right femur, right hip, pelvis, and lumbar spine  Ambulatory dysfunction Ambulatory dysfunction with difficulty walking and right leg pain. Physical therapy ongoing. No new neurological symptoms reported. - Continue physical therapy as recommended by primary doctor      No follow-ups on file.        Rockie Agent, MD  [facility] [phone] (phone) [phone] (fax)  [facility]

## 2024-05-05 NOTE — Patient Instructions (Addendum)
     Please report to [facility] located at:  [address]  [place], [place] [address]  You do not need an appointment to have xrays completed.   Our office will follow up with  results once available.    Please start Keflex  500mg  three times daily for the next week to treat your bladder infection (cystitis). We will follow up if the urine culture shows we need to change the antibiotic.   I have also ordered urine microscopy to test for red blood cells in the urine.

## 2024-05-06 LAB — MICROSCOPIC EXAMINATION

## 2024-05-07 LAB — SPECIMEN STATUS REPORT

## 2024-05-07 LAB — MICROSCOPIC EXAMINATION
Epithelial Cells (non renal): >10 /HPF — AB (ref 0–10)
RBC, Urine: NONE SEEN /HPF (ref 0–2)
Renal Epithel, UA: NONE SEEN /LPF

## 2024-05-07 LAB — URINALYSIS, ROUTINE W REFLEX MICROSCOPIC
Bilirubin, UA: NEGATIVE
Glucose, UA: NEGATIVE
Ketones, UA: NEGATIVE
Nitrite, UA: POSITIVE — AB
Specific Gravity, UA: 1.026 (ref 1.005–1.030)
Urobilinogen, Ur: 0.2 mg/dL (ref 0.2–1.0)
pH, UA: 6 (ref 5.0–7.5)

## 2024-05-07 LAB — URINE CULTURE

## 2024-05-11 ENCOUNTER — Ambulatory Visit: Payer: Self-pay

## 2024-05-11 ENCOUNTER — Encounter: Payer: Self-pay | Admitting: Family Medicine

## 2024-05-11 ENCOUNTER — Ambulatory Visit: Admitting: Family Medicine

## 2024-05-11 VITALS — BP 125/51 | HR 64 | Wt 106.5 lb

## 2024-05-11 DIAGNOSIS — S32030D Wedge compression fracture of third lumbar vertebra, subsequent encounter for fracture with routine healing: Secondary | ICD-10-CM | POA: Diagnosis not present

## 2024-05-11 DIAGNOSIS — S32040A Wedge compression fracture of fourth lumbar vertebra, initial encounter for closed fracture: Secondary | ICD-10-CM

## 2024-05-11 DIAGNOSIS — M5416 Radiculopathy, lumbar region: Secondary | ICD-10-CM | POA: Diagnosis not present

## 2024-05-11 DIAGNOSIS — S32040D Wedge compression fracture of fourth lumbar vertebra, subsequent encounter for fracture with routine healing: Secondary | ICD-10-CM | POA: Diagnosis not present

## 2024-05-11 DIAGNOSIS — S32030A Wedge compression fracture of third lumbar vertebra, initial encounter for closed fracture: Secondary | ICD-10-CM

## 2024-05-11 DIAGNOSIS — R197 Diarrhea, unspecified: Secondary | ICD-10-CM | POA: Diagnosis not present

## 2024-05-11 MED ORDER — GABAPENTIN 100 MG PO CAPS
100.0000 mg | ORAL_CAPSULE | Freq: Two times a day (BID) | ORAL | 0 refills | Status: DC
Start: 2024-05-11 — End: 2024-05-14

## 2024-05-11 NOTE — Patient Instructions (Signed)
 Take acetaminophen  1000 mg twice daily

## 2024-05-11 NOTE — Telephone Encounter (Signed)
 Noted

## 2024-05-11 NOTE — Telephone Encounter (Signed)
 Pt's son [name], after permission from patient to speak to him, given imageing results per notes of Dr. Lang on 05/05/24. He verbalized understanding.

## 2024-05-11 NOTE — Progress Notes (Signed)
 Acute visit   Patient: Anna Johnson   DOB: Sep 21, 1940   83 y.o. Female  MRN: [id number] PCP: Gasper Nancyann BRAVO, MD   Chief Complaint  Patient presents with   Acute Visit    Back pain//Triage RN completed today. She reported pain X 2 weeks ago located in sacral area (buttocks), with constant severe pain (9/10). She reported that it would radiate below the knee. Believes pain is caused by compression fracture per xray report. She is taking tylenol with no neurological symptoms and she is being treated for a uti.    Back Pain   Subjective    Discussed the use of AI scribe software for clinical note transcription with the patient, who gave verbal consent to proceed.  History of Present Illness   Anna Johnson is an 83 year old female with osteoporosis who presents with leg pain and recent falls. She is accompanied by her son, who drives her to appointments.  She has experienced leg pain for two weeks, beginning after sitting on a stairway. Initially affecting both legs, the pain is now localized to the right leg, extending to the knee and lower leg. The pain is described as a non-sharp sensation that 'just hurts' and 'runs up and down.'  She has osteoporosis, confirmed by a bone density scan in 2023, and a history of multiple falls, including a recent fall in May and a hip fracture twenty years ago. She also has a history of a wrist fracture. Recent x-rays of the low back and femur have been performed.  She takes acetaminophen, two pills twice a day, for pain management. She recently completed a course of antibiotics for a urinary tract infection and has been experiencing bowel issues, possibly related to antibiotic use. No back pain is reported, but she has experienced bowel control issues.       Review of Systems  Objective    BP (!) 125/51   Pulse 64   Wt 106 lb 8 oz (48.3 kg)   SpO2 100%   BMI 19.48 kg/m    Physical Exam Vitals reviewed.  Constitutional:       General: She is not in acute distress.    Appearance: Normal appearance. She is well-developed. She is not diaphoretic.  HENT:     Head: Normocephalic and atraumatic.  Eyes:     General: No scleral icterus.    Conjunctiva/sclera: Conjunctivae normal.  Neck:     Thyroid : No thyromegaly.  Cardiovascular:     Rate and Rhythm: Normal rate and regular rhythm.  Pulmonary:     Effort: Pulmonary effort is normal. No respiratory distress.     Breath sounds: Normal breath sounds. No wheezing, rhonchi or rales.  Musculoskeletal:     Cervical back: Neck supple.     Right lower leg: No edema.     Left lower leg: No edema.     Comments: No back TTP over midline. Strength limited due to pain, sensation intact  Lymphadenopathy:     Cervical: No cervical adenopathy.  Skin:    General: Skin is warm and dry.     Findings: No rash.  Neurological:     Mental Status: She is alert and oriented to person, place, and time. Mental status is at baseline.  Psychiatric:        Mood and Affect: Mood normal.        Behavior: Behavior normal.       No results found for any  visits on 05/11/24.  Assessment & Plan     Problem List Items Addressed This Visit   None Visit Diagnoses       Compression fracture of L3 lumbar vertebra, closed, initial encounter (HCC)    -  Primary   Relevant Orders   Ambulatory referral to Neurosurgery     Compression fracture of L4 lumbar vertebra, closed, initial encounter [facility])       Relevant Orders   Ambulatory referral to Neurosurgery     Lumbar radiculopathy       Relevant Medications   gabapentin (NEURONTIN) 100 MG capsule   Other Relevant Orders   Ambulatory referral to Neurosurgery     Diarrhea, unspecified type               Lumbar compression fractures with radiculopathy in the setting of osteoporosis New compression fractures at L3 and L4, possibly contributing to current leg pain. Old compression fractures at T12 and L1. Osteoporosis confirmed from  previous bone density scan, increasing risk for compression fractures. Pain in the right leg, likely due to radiculopathy from nerve compression. - Prescribe gabapentin 100 mg twice daily for nerve pain. - Continue acetaminophen 1000 mg twice daily for pain management. - Refer to neurosurgery for evaluation of potential vertebroplasty or nerve block injections, with patient preferring to delay immediate action.  Urinary tract infection Currently on antibiotic treatment, which may be contributing to gastrointestinal symptoms.  Stool incontinence Possibly exacerbated by current antibiotic treatment for urinary tract infection. - Encourage consumption of yogurt with probiotics to help manage gastrointestinal symptoms. - if not resolving consider C diff testing  Dementia Contributing to difficulty in managing medications and symptoms. Son is assisting with care and medication management.  Osteoporosis Discussed the importance of managing osteoporosis to prevent further fractures. Encouraged dietary modifications to support bone health.       Meds ordered this encounter  Medications   gabapentin (NEURONTIN) 100 MG capsule    Sig: Take 1 capsule (100 mg total) by mouth 2 (two) times daily.    Dispense:  60 capsule    Refill:  0     Return if symptoms worsen or fail to improve.      Jon Eva, MD  [facility] [phone] (phone) [phone] (fax)  [facility]

## 2024-05-11 NOTE — Telephone Encounter (Signed)
 FYI Only or Action Required?: FYI only for provider.  Patient was last seen in primary care on 05/05/2024 by Sharma Coyer, MD.  Called Nurse Triage reporting Tailbone Pain.  Symptoms began several weeks ago.  Interventions attempted: OTC medications: tylenol and Prescription medications: calcitonin spray.  Symptoms are: unchanged.  Triage Disposition: See HCP Within 4 Hours (Or PCP Triage)  Patient/caregiver understands and will follow disposition?: Yes   Copied from CRM #[id number]. Topic: Clinical - Red Word Triage >> May 11, 2024  9:31 AM Anna Johnson wrote: [facility] that prompted transfer to Nurse Triage: RT leg and back pain. Reason for Disposition  [1] SEVERE back pain (e.g., excruciating, unable to do any normal activities) AND [2] not improved 2 hours after pain medicine  Answer Assessment - Initial Assessment Questions Patient's son [name] called, patient gave permission to speak to him. He is saying she wants the report of the xray. Report given in a separate encounter. He then says she's having severe pain and he wants her to see someone today, patient says she wants to come in today as well. Scheduled with Dr. Myrla.   1. ONSET: When did the pain begin? (e.g., minutes, hours, days)     2 weeks ago  2. LOCATION: Where does it hurt? (upper, mid or lower back)     Sacral area, buttocks  3. SEVERITY: How bad is the pain?  (e.g., Scale 1-10; mild, moderate, or severe)     9  4. PATTERN: Is the pain constant? (e.g., yes, no; constant, intermittent)      Constant  5. RADIATION: Does the pain shoot into your legs or somewhere else?     Legs below the knee  6. CAUSE:  What do you think is causing the back pain?      Compression fracture per xray report   8. MEDICINES: What have you taken so far for the pain? (e.g., nothing, acetaminophen, NSAIDS)     Tylenol  9. NEUROLOGIC SYMPTOMS: Do you have any weakness, numbness, or problems with  bowel/bladder control?     No  10. OTHER SYMPTOMS: Do you have any other symptoms? (e.g., fever, abdomen pain, burning with urination, blood in urine)       Being treated for UTI  Protocols used: Back Pain-A-AH

## 2024-05-13 ENCOUNTER — Ambulatory Visit: Payer: Self-pay

## 2024-05-13 NOTE — Telephone Encounter (Signed)
 FYI Only or Action Required?: Action required by provider: Medication change.  Patient was last seen in primary care on 05/11/2024 by Anna Jon HERO, MD.  Called Nurse Triage reporting Pain.  Symptoms began several weeks ago.  Interventions attempted: OTC medications: Bengay, Tylenol and Prescription medications: Gabapentin.  Symptoms are: gradually worsening.  Triage Disposition: See PCP When Office is Open (Within 3 Days)  Patient/caregiver understands and will follow disposition?: Yes, but will wait  RN Suggesting pt be seen by PCP in 3 days. Pt expressed wanting to reach out to office first to see if different script, or change to current medication dose can be sent to pharmacy first. If no improvements seen with change in medication, pt agreeable to schedule appt with PCP.  Copied from CRM #[id number]. Topic: Clinical - Red Word Triage >> May 13, 2024 10:56 AM Selinda RAMAN wrote: Red Word that prompted transfer to Nurse Triage: Anna Johnson the son of the patient called in along with the patient stating she has been having severe pain in her right leg. She was prescribed gabapentin but it is not helping. I will transfer them to E2C2 NT  Reason for Disposition  [1] MODERATE pain (e.g., interferes with normal activities, limping) AND [2] present > 3 days  Answer Assessment - Initial Assessment Questions 1. ONSET: When did the pain start?      Ongoing right leg pain, recently started gabapentin on 9/30 for the pain.  2. LOCATION: Where is the pain located?      Ongoing right leg pain.  3. PAIN: How bad is the pain?    (Scale 1-10; or mild, moderate, severe)     Right leg pain, chronic moderate pain.  4. WORK OR EXERCISE: Has there been any recent work or exercise that involved this part of the body?      Fell this morning in the bathroom. Hit back of head. No headache. Not on blood thinners. Family called 911, evaluated and cleared by EMS to stay home.  5. CAUSE: What do you  think is causing the leg pain?     Unsure, ongoing.  6. OTHER SYMPTOMS: Do you have any other symptoms? (e.g., chest pain, back pain, breathing difficulty, swelling, rash, fever, numbness, weakness)     No change in cognition, vision or speech.  Protocols used: Leg Pain-A-AH

## 2024-05-14 ENCOUNTER — Other Ambulatory Visit: Payer: Self-pay | Admitting: Family Medicine

## 2024-05-14 DIAGNOSIS — M5416 Radiculopathy, lumbar region: Secondary | ICD-10-CM

## 2024-05-14 MED ORDER — GABAPENTIN 300 MG PO CAPS: 300.0000 mg | ORAL_CAPSULE | Freq: Two times a day (BID) | ORAL | 1 refills | Status: DC

## 2024-05-14 NOTE — Telephone Encounter (Signed)
 Have sent prescription for higher dose of gabapentin

## 2024-05-24 ENCOUNTER — Other Ambulatory Visit: Payer: Self-pay | Admitting: Family Medicine

## 2024-05-24 DIAGNOSIS — M8008XA Age-related osteoporosis with current pathological fracture, vertebra(e), initial encounter for fracture: Secondary | ICD-10-CM

## 2024-05-26 ENCOUNTER — Other Ambulatory Visit: Payer: Self-pay

## 2024-05-26 DIAGNOSIS — S32010S Wedge compression fracture of first lumbar vertebra, sequela: Secondary | ICD-10-CM

## 2024-05-26 NOTE — Progress Notes (Unsigned)
 Referring Physician:  Myrla Jon HERO, MD [address] [place],  [place] [address]  Primary Physician:  Gasper Nancyann BRAVO, MD  History of Present Illness: 06/03/2024 Ms. Anna Johnson has a history of HTN, osteoporosis, hyperlipidemia, open angle glaucoma, breast CA.   Son helps with history as needed.   She had an injury to back in September when she was sitting on stairs and hit her back, no fall. She started with right leg pain the following day.   She has no LBP with intermittent right leg pain in her entire leg, mostly lateral and anterior, to her ankle. She's had twinges of left anterior thigh pain. She has good days and bad days. She has weakness in right leg. Pain in leg is generally worse in the morning when she gets out of bed. She has known neuropathy, but states she has no numbness or tingling.   History of compression fractures noted on lumbar xrays.   Started on neurontin 300mg  bid by PCP on 05/11/24- this helps.    Tobacco use: Does not smoke.   Bowel/Bladder Dysfunction: none  Conservative measures:  Physical therapy:  did some PT through [facility] from 12/18/23 to 04/23/24 for her balance/weakness and was discharged Multimodal medical therapy including regular antiinflammatories:  tylenol, gabapentin Injections:  no epidural steroid injections  Past Surgery: no spinal surgeries  The symptoms are causing a significant impact on the patient's life.   Review of Systems:  A 10 point review of systems is negative, except for the pertinent positives and negatives detailed in the HPI.  Past Medical History: Past Medical History:  Diagnosis Date   Allergy    Glaucoma    Hyperlipidemia    Hypertension    Osteoporosis    Pneumonia 03/29/2015    Past Surgical History: Past Surgical History:  Procedure Laterality Date   BREAST BIOPSY Left 03/16/2018   Affirm Biopsy- Ribbon clip- positive   BREAST LUMPECTOMY Left 04/2018   CATARACT EXTRACTION  2004    DILATION AND CURETTAGE OF UTERUS     HIP FRACTURE SURGERY  09/15/2010   left hip   WRIST FRACTURE SURGERY     ORIF    Allergies: Allergies as of 06/03/2024 - Review Complete 06/03/2024  Allergen Reaction Noted   Codeine Other (See Comments) 03/29/2015   Seasonal ic [cholestatin]  07/17/2017   Covera-hs [verapamil] Rash 03/29/2015    Medications: Outpatient Encounter Medications as of 06/03/2024  Medication Sig   Acetaminophen 500 MG capsule Take 2 capsules by mouth as needed for fever.   amLODipine  (NORVASC ) 5 MG tablet TAKE 1 TABLET BY MOUTH DAILY   aspirin 81 MG tablet Take 81 mg by mouth daily.   benazepril  (LOTENSIN ) 20 MG tablet TAKE 1 TABLET BY MOUTH DAILY. TAKE IN PLACE OF QUINAPRIL .   calcitonin, salmon, (MIACALCIN/FORTICAL) 200 UNIT/ACT nasal spray PLACE 1 SPRAY INTO ALTERNATE NOSTRILS DAILY   donepezil  (ARICEPT ) 5 MG tablet Take 1 tablet (5 mg total) by mouth at bedtime.   dorzolamide-timolol (COSOPT) 22.3-6.8 MG/ML ophthalmic solution    fluticasone  (FLONASE ) 50 MCG/ACT nasal spray USE TWO SPRAYS IN EACH NOSTRIL EACH DAY AS DIRECTED BY PHYSICIAN (Patient taking differently: Place 2 sprays into both nostrils as needed for allergies. USE TWO SPRAYS IN EACH NOSTRIL EACH DAY AS DIRECTED BY PHYSICIAN TAKES PRN)   gabapentin (NEURONTIN) 300 MG capsule Take 1 capsule (300 mg total) by mouth 2 (two) times daily.   hydrochlorothiazide  (HYDRODIURIL ) 25 MG tablet Take 25 mg by  mouth daily.   latanoprost (XALATAN) 0.005 % ophthalmic solution Place 1 drop into both eyes at bedtime.   loratadine (CLARITIN) 10 MG tablet Take 10 mg by mouth daily as needed for allergies.   lovastatin  (MEVACOR ) 40 MG tablet TAKE 1 TABLET BY MOUTH AT BEDTIME   LUMIGAN 0.01 % SOLN Place 1 drop into both eyes at bedtime.    metoprolol  succinate (TOPROL -XL) 50 MG 24 hr tablet TAKE 1 TABLET BY MOUTH DAILY WITH ANY MEAL OR IMMEDIATELY FOLLOWING A MEAL   povidone-iodine  (BETADINE ) 10 % external solution Apply 1  Application topically as needed for wound care.   timolol (TIMOPTIC) 0.5 % ophthalmic solution Apply to eye.   No facility-administered encounter medications on file as of 06/03/2024.    Social History: Social History   Tobacco Use   Smoking status: Never   Smokeless tobacco: Never  Vaping Use   Vaping status: Never Used  Substance Use Topics   Alcohol use: Yes    Alcohol/week: 1.0 standard drink of alcohol    Types: 1 Glasses of wine per week    Comment: Occasionally   Drug use: No    Family Medical History: Family History  Problem Relation Age of Onset   Uterine cancer Mother    Heart attack Father    Healthy Brother    Cancer Son        colon, died from sx   Breast cancer Neg Hx     Physical Examination: Vitals:   06/03/24 1039 06/03/24 1142  BP: (!) 116/47 (!) 110/53    General: Patient is well developed, well nourished, calm, collected, and in no apparent distress. Attention to examination is appropriate.  Respiratory: Patient is breathing without any difficulty.   NEUROLOGICAL:     Awake, alert, oriented to person, place, and time.  Speech is clear and fluent. Fund of knowledge is appropriate.   Cranial Nerves: Pupils equal round and reactive to light.  Facial tone is symmetric.    No posterior thoracic or lumbar tenderness.   No abnormal lesions on exposed skin.   Strength: Side Biceps Triceps Deltoid Interossei Grip Wrist Ext. Wrist Flex.  R 5 5 5 5 5 5 5   L 5 5 5 5 5 5 5    Side Iliopsoas Quads Hamstring PF DF EHL  R 5 5 5 5 5 5   L 5 5 5 5 5 5    Reflexes are 1+ and symmetric at the biceps, brachioradialis, patella and achilles.   Hoffman's is absent.  Clonus is not present.   Bilateral upper and lower extremity sensation is intact to light touch.     No pain with IR/ER of both hips.   No tenderness over greater trochanteric bursa bilaterally.   She ambulates slowly with a cane.   Medical Decision Making  Imaging: Lumbar xrays dated  06/03/24:  Diffuse spondylosis. No gross change in comparison to xrays from 05/05/24. Multiple compression fractures noted.   Report for above xrays not yet available.    Lumbar xrays dated 05/05/24:  FINDINGS: Bony demineralization contributing to cortical indistinctness and reduced diagnostic sensitivity and specificity. Moderate dextroconvex lumbar scoliosis with rotary component.   Interval compression fractures at L3 and L4, age indeterminate. Chronic compression fractures at T12 and L1 subtle superior endplate compression fracture at L2 similar to previous.   Lumbar spondylosis.  Left hip prosthesis.   IMPRESSION: 1. Interval compression fractures at L3 and L4, age indeterminate. 2. Chronic compression fractures at T12 and L1. 3.  Bony demineralization. 4. Moderate dextroconvex lumbar scoliosis with rotary component. 5. Lumbar spondylosis.     Electronically Signed   By: Ryan Salvage M.D.   On: 05/05/2024 13:46  I have personally reviewed the images and agree with the above interpretation.  Assessment and Plan: Anna Johnson had an injury to back in September when she was sitting on stairs and hit her back, no fall. She started with right leg pain the following day.   She has no LBP with intermittent right leg pain in her entire leg, mostly lateral and anterior, to her ankle. She's had twinges of left anterior thigh pain. She has good days and bad days. She has weakness in right leg.   Lumbar xrays from 05/05/24 and from today show lumbar spondylosis with multiple compression fractures.   She has no thoracic or lumbar tenderness, above fractures are likely old. Right leg pain is likely lumbar mediated.   Treatment options discussed with patient and following plan made:   - MRI of lumbar spine to further evaluate acuity of fractures and also to evaluate right leg pain.  - Continue on neurontin from PCP. This has helped right leg pain.  - Depending on results of MRI.  May consider PT and/or injections.  - Will schedule follow up visit to review MRI results once I get them back.   BP is slightly low- she has no symptoms. We discussed and she will call PCP if any issues.   I spent a total of 35 minutes in face-to-face and non-face-to-face activities related to this patient's care today including review of outside records, review of imaging, review of symptoms, physical exam, discussion of differential diagnosis, discussion of treatment options, and documentation.   Thank you for involving me in the care of this patient.   Glade Boys PA-C Dept. of Neurosurgery

## 2024-06-03 ENCOUNTER — Ambulatory Visit

## 2024-06-03 ENCOUNTER — Ambulatory Visit: Admission: RE | Admit: 2024-06-03 | Source: Ambulatory Visit

## 2024-06-03 ENCOUNTER — Ambulatory Visit: Admitting: Orthopedic Surgery

## 2024-06-03 ENCOUNTER — Encounter: Payer: Self-pay | Admitting: Orthopedic Surgery

## 2024-06-03 VITALS — BP 110/53 | Ht 65.0 in | Wt 104.4 lb

## 2024-06-03 DIAGNOSIS — M47816 Spondylosis without myelopathy or radiculopathy, lumbar region: Secondary | ICD-10-CM

## 2024-06-03 DIAGNOSIS — M4156 Other secondary scoliosis, lumbar region: Secondary | ICD-10-CM | POA: Diagnosis not present

## 2024-06-03 DIAGNOSIS — S32010S Wedge compression fracture of first lumbar vertebra, sequela: Secondary | ICD-10-CM

## 2024-06-03 DIAGNOSIS — S32039D Unspecified fracture of third lumbar vertebra, subsequent encounter for fracture with routine healing: Secondary | ICD-10-CM

## 2024-06-03 DIAGNOSIS — S32049D Unspecified fracture of fourth lumbar vertebra, subsequent encounter for fracture with routine healing: Secondary | ICD-10-CM | POA: Diagnosis not present

## 2024-06-03 DIAGNOSIS — Z8781 Personal history of (healed) traumatic fracture: Secondary | ICD-10-CM | POA: Diagnosis not present

## 2024-06-03 DIAGNOSIS — M5416 Radiculopathy, lumbar region: Secondary | ICD-10-CM

## 2024-06-03 NOTE — Patient Instructions (Signed)
 It was so nice to see you today. Thank you so much for coming in.    Your lower back xrays show wear and tear in your back along with multiple compression fractures that I think are old.   I think your right leg pain is likely coming from your back.   I want to get an MRI of your lower back to look into things further. We will get this approved through your insurance and [facility] will call you to schedule the appointment. Ask about your patient responsibility. You do not need to pay this prior to getting MRI, they can bill you.   [facility] is located at [address] in [place]. This is near the intersection of [address] and [address].   After you have the MRI, it can take 14-28 days for me to get the results back. If I don't have them in 2 weeks, we will call to try to get the results.   Once I have the results, we will call you to schedule a follow up visit with me to review them.   Your blood pressure was a little low. If you have any symptoms such as feeling dizzy or light headed then I would contact your PCP.   Please do not hesitate to call if you have any questions or concerns. You can also message me in MyChart.   Glade Boys PA-C [phone]     The physicians and staff at [facility] at [facility] are committed to providing excellent care. You may receive a survey asking for feedback about your experience at our office. We value you your feedback and appreciate you taking the time to to fill it out. The [facility] leadership team is also available to discuss your experience in person, feel free to contact us  [phone].

## 2024-06-14 ENCOUNTER — Ambulatory Visit
Admission: RE | Admit: 2024-06-14 | Discharge: 2024-06-14 | Disposition: A | Discharge status: Home / Self Care | Source: Ambulatory Visit | Attending: Orthopedic Surgery | Admitting: Orthopedic Surgery

## 2024-06-14 DIAGNOSIS — M47816 Spondylosis without myelopathy or radiculopathy, lumbar region: Secondary | ICD-10-CM

## 2024-06-14 DIAGNOSIS — Z8781 Personal history of (healed) traumatic fracture: Secondary | ICD-10-CM

## 2024-06-14 DIAGNOSIS — M5116 Intervertebral disc disorders with radiculopathy, lumbar region: Secondary | ICD-10-CM | POA: Diagnosis not present

## 2024-06-14 DIAGNOSIS — M4727 Other spondylosis with radiculopathy, lumbosacral region: Secondary | ICD-10-CM | POA: Diagnosis not present

## 2024-06-14 DIAGNOSIS — M5416 Radiculopathy, lumbar region: Secondary | ICD-10-CM

## 2024-06-14 DIAGNOSIS — M48061 Spinal stenosis, lumbar region without neurogenic claudication: Secondary | ICD-10-CM | POA: Diagnosis not present

## 2024-06-15 ENCOUNTER — Ambulatory Visit: Admitting: Family Medicine

## 2024-06-16 ENCOUNTER — Telehealth: Payer: Self-pay

## 2024-06-16 ENCOUNTER — Encounter: Payer: Self-pay | Admitting: Family Medicine

## 2024-06-16 ENCOUNTER — Ambulatory Visit: Admitting: Family Medicine

## 2024-06-16 VITALS — BP 113/41 | HR 53 | Ht 59.5 in | Wt 94.5 lb

## 2024-06-16 DIAGNOSIS — R001 Bradycardia, unspecified: Secondary | ICD-10-CM | POA: Diagnosis not present

## 2024-06-16 DIAGNOSIS — R413 Other amnesia: Secondary | ICD-10-CM | POA: Diagnosis not present

## 2024-06-16 DIAGNOSIS — I10 Essential (primary) hypertension: Secondary | ICD-10-CM

## 2024-06-16 DIAGNOSIS — E785 Hyperlipidemia, unspecified: Secondary | ICD-10-CM

## 2024-06-16 MED ORDER — BENAZEPRIL HCL 10 MG PO TABS: 10.0000 mg | ORAL_TABLET | Freq: Every day | ORAL | 2 refills | Status: AC

## 2024-06-16 NOTE — Telephone Encounter (Signed)
 Copied from CRM #[id number]. Topic: Clinical - Medical Advice >> Jun 16, 2024  2:04 PM Emylou G wrote: Reason for CRM: BP is very low, concerned she is still driving, if she can live independently.  Feels like she should be referred to cardiologist?  Pls call daughter to discuss.

## 2024-06-16 NOTE — Progress Notes (Signed)
 Established patient visit   Patient: Anna Johnson   DOB: 1940/08/26   83 y.o. Female  MRN: [id number] Visit Date: 06/16/2024  Today's healthcare provider: Nancyann Perry, MD   Chief Complaint  Patient presents with   Medical Management of Chronic Issues    Patient reports she is taking medications as prescribed with no symptoms to report.    Hypertension   Hyperlipidemia   Subjective    Discussed the use of AI scribe software for clinical note transcription with the patient, who gave verbal consent to proceed.  History of Present Illness   Anna Johnson is an 83 year old female who presents for a routine checkup.  She experiences some fatigue and has had trouble with her leg and walking following a fall in September. Both legs have been bothersome, but one leg continues to cause significant difficulty in mobility, necessitating the use of a cane. An MRI was performed on Monday, ordered by an orthopedist.  She takes a vitamin B12 supplement daily. She recalls taking Aricept  at night and possibly gabapentin, which was prescribed twice a day for nerve pain, though she is unsure if she is still on it. She finds it challenging to keep track of all her medications.  Her memory is reportedly doing well, though her granddaughter has noticed some issues with remembering names and other minor details. She is aware of the current day, month, and year, though she recalls missing one date about a month ago. She has been taking Aricept , which is intended to help with memory.  She does not monitor her blood pressure at home but recalls taking benazepril  20 mg in the morning along with her vitamin supplement. She notes feeling tired, which she attributes to her medications. She has experienced weight loss recently, particularly in the last month, and expresses some concern about it.  No chest pains, heart flutters, or shortness of breath. She mentions feeling tired, which she attributes to  her medications.     Lab Results  Component Value Date   NA 135 04/19/2024   K 4.3 04/19/2024   CREATININE 0.73 04/19/2024   EGFR 81 04/19/2024   GLUCOSE 88 04/19/2024   Lab Results  Component Value Date   CHOL 184 04/19/2024   HDL 91 04/19/2024   LDLCALC 83 04/19/2024   TRIG 51 04/19/2024   CHOLHDL 2.0 04/19/2024   Lab Results  Component Value Date   VITAMINB12 356 04/19/2024   Lab Results  Component Value Date   TSH 0.757 04/19/2024     Medications: Outpatient Medications Prior to Visit  Medication Sig   Acetaminophen 500 MG capsule Take 2 capsules by mouth as needed for fever.   amLODipine  (NORVASC ) 5 MG tablet TAKE 1 TABLET BY MOUTH DAILY   aspirin 81 MG tablet Take 81 mg by mouth daily.   calcitonin, salmon, (MIACALCIN/FORTICAL) 200 UNIT/ACT nasal spray PLACE 1 SPRAY INTO ALTERNATE NOSTRILS DAILY   donepezil  (ARICEPT ) 5 MG tablet Take 1 tablet (5 mg total) by mouth at bedtime.   dorzolamide-timolol (COSOPT) 22.3-6.8 MG/ML ophthalmic solution    fluticasone  (FLONASE ) 50 MCG/ACT nasal spray USE TWO SPRAYS IN EACH NOSTRIL EACH DAY AS DIRECTED BY PHYSICIAN (Patient taking differently: Place 2 sprays into both nostrils as needed for allergies. USE TWO SPRAYS IN EACH NOSTRIL EACH DAY AS DIRECTED BY PHYSICIAN TAKES PRN)   gabapentin (NEURONTIN) 300 MG capsule Take 1 capsule (300 mg total) by mouth 2 (two) times daily.  hydrochlorothiazide  (HYDRODIURIL ) 25 MG tablet Take 25 mg by mouth daily.   latanoprost (XALATAN) 0.005 % ophthalmic solution Place 1 drop into both eyes at bedtime.   loratadine (CLARITIN) 10 MG tablet Take 10 mg by mouth daily as needed for allergies.   lovastatin  (MEVACOR ) 40 MG tablet TAKE 1 TABLET BY MOUTH AT BEDTIME   LUMIGAN 0.01 % SOLN Place 1 drop into both eyes at bedtime.    metoprolol  succinate (TOPROL -XL) 50 MG 24 hr tablet TAKE 1 TABLET BY MOUTH DAILY WITH ANY MEAL OR IMMEDIATELY FOLLOWING A MEAL   povidone-iodine  (BETADINE ) 10 % external  solution Apply 1 Application topically as needed for wound care.   timolol (TIMOPTIC) 0.5 % ophthalmic solution Apply to eye.   benazepril  (LOTENSIN ) 20 MG tablet TAKE 1 TABLET BY MOUTH DAILY.    No facility-administered medications prior to visit.   Review of Systems  Constitutional:  Negative for appetite change, chills, fatigue and fever.  Respiratory:  Negative for chest tightness and shortness of breath.   Cardiovascular:  Negative for chest pain and palpitations.  Gastrointestinal:  Negative for abdominal pain, nausea and vomiting.  Neurological:  Negative for dizziness and weakness.       Objective    BP (!) 113/41 (BP Location: Left Arm, Patient Position: Sitting)   Pulse (!) 53   Ht 4' 11.5 (1.511 m)   Wt 94 lb 8 oz (42.9 kg)   SpO2 100%   BMI 18.77 kg/m   Physical Exam   General: Appearance:    Thin female in no acute distress  Eyes:    PERRL, conjunctiva/corneas clear, EOM's intact       Lungs:     Clear to auscultation bilaterally, respirations unlabored  Heart:    Bradycardic. Normal rhythm. No murmurs, rubs, or gallops.    MS:   All extremities are intact.    Neurologic:   Awake, alert, oriented x 3. No apparent focal neurological defect. Frequently repeating questions and re asking questions that were just answered.      EKG: Bradycardia rate = 47    Assessment & Plan    1. Primary hypertension (Primary) Low DBP, reduce benazepril  to benazepril  (LOTENSIN ) 10 MG tablet; Take 1 tablet (10 mg total) by mouth daily.  Dispense: 90 tablet; Refill: 2  2. Hyperlipidemia, unspecified hyperlipidemia type She is tolerating lovastatin  well with no adverse effects.    3. Bradycardia Asymptomatic, continue to monitor.   4. Memory deficit Here with her granddaughter today who report patient frequency forgets names and repeats questions which is not normal to her. She is A&O x 3 and scored 6 on CIT at her [facility], but notably repeating questions and quickly forgetting  answers to questions that were just discussed. Recently started trial of 5mg  donepezil  and not yet noticed any improvement. She would like further evaluation. She was recently started on B12 supplement for borderline low vitamin B12.   - Ambulatory referral to Neurology  Return in about 3 months (around 09/16/2024) for Hypertension.      Nancyann Perry, MD  [facility] [phone] (phone) [phone] (fax)  [facility]

## 2024-06-16 NOTE — Patient Instructions (Signed)
 SABRA  Please review the attached list of medications and notify my office if there are any errors.   . Please bring all of your medications to every appointment so we can make sure that our medication list is the same as yours.

## 2024-06-22 ENCOUNTER — Ambulatory Visit: Admitting: Podiatry

## 2024-06-23 ENCOUNTER — Encounter (INDEPENDENT_AMBULATORY_CARE_PROVIDER_SITE_OTHER): Admitting: Ophthalmology

## 2024-06-23 DIAGNOSIS — H35033 Hypertensive retinopathy, bilateral: Secondary | ICD-10-CM

## 2024-06-23 DIAGNOSIS — H353132 Nonexudative age-related macular degeneration, bilateral, intermediate dry stage: Secondary | ICD-10-CM

## 2024-06-23 DIAGNOSIS — H33302 Unspecified retinal break, left eye: Secondary | ICD-10-CM

## 2024-06-23 DIAGNOSIS — H43813 Vitreous degeneration, bilateral: Secondary | ICD-10-CM | POA: Diagnosis not present

## 2024-06-23 DIAGNOSIS — I10 Essential (primary) hypertension: Secondary | ICD-10-CM

## 2024-06-23 NOTE — Progress Notes (Unsigned)
 Telephone Visit- Progress Note: Referring Physician:  Gasper Nancyann BRAVO, MD [address] [place],  [place] [address]  Primary Physician:  Gasper Nancyann BRAVO, MD  This visit was performed via telephone.  Patient location: home Provider location: working from home  I spent a total of 15 minutes non-face-to-face activities for this visit on the date of this encounter including review of current clinical condition and response to treatment.    Patient has given verbal consent to this telephone visits and we reviewed the limitations of a telephone visit. Patient wishes to proceed.    Chief Complaint:  review imaging  History of Present Illness: Anna Johnson is a 83 y.o. female has a history of  HTN, osteoporosis, hyperlipidemia, open angle glaucoma, breast CA.    Daughter in law Devere on phone with patient and helps with history as needed.   Last seen by me on 06/03/24 for no LBP with intermittent right leg pain in her entire leg. She has known lumbar spondylosis with multiple compression fractures.    She had no thoracic or lumbar tenderness on exam at last visit, above fractures are likely old. Right leg pain is likely lumbar mediated.   Lumbar MRI ordered and phone visit scheduled to review it.   She is doing better! She only has intermittent right ankle pain. No LBP or buttock pain. No right or left hip or leg pain. No numbness, tingling, or weakness. No specific aggravating factors. Pain is tolerable.    She continues on neurontin.   Tobacco use: Does not smoke.    Bowel/Bladder Dysfunction: none   Conservative measures:  Physical therapy:  did some PT through [facility] from 12/18/23 to 04/23/24 for her balance/weakness and was discharged Multimodal medical therapy including regular antiinflammatories:  tylenol, gabapentin Injections:  no epidural steroid injections   Past Surgery: no spinal surgeries   The symptoms are causing a significant impact on the  patient's life.   Exam: No exam done as this was a telephone encounter.     Imaging: Lumbar MRI dated 06/14/24:   FINDINGS:   BONES AND ALIGNMENT: Complex right scoliosis. Normal vertebral body heights except for compression deformities. Multiple chronic thoracic compression deformities are again noted, not significantly changed at T11-T12 and L1. There are healed superior endplate compression deformities at L2 and L3. Compared with the previous MRI, there are new mild inferior endplate compression deformities at L3 and a biconcave compression deformity at L4. These appear subacute with minimal residual edema. Chronic superior endplate compression deformity at L5 appears unchanged, without residual marrow edema. There is bilateral sacral edema consistent with bilateral sacral insufficiency fractures. Bone marrow signal is otherwise unremarkable.   SPINAL CORD: The conus terminates normally.   SOFT TISSUES: No paraspinal mass.   L1-L2: Mild disc bulging and endplate osteophytes. No spinal stenosis or significant foraminal narrowing.   L2-L3: Preserved disc height with mild disc bulging and bilateral facet hypertrophy. No significant spinal stenosis or no overt encroachment.   L3-L4: Preserved disc height with annular disc bulging and endplate osteophytes asymmetric to the left. Mild to moderate facet and ligamentous hypertrophy. Mild spinal stenosis with mild right and moderate left foraminal narrowing.   L4-L5: Preserved disc height with mild disc bulging, facet and ligamentous hypertrophy. No significant spinal stenosis. Mild foraminal narrowing bilaterally without definite L4 nerve root impingement.   L5-S1: Preserved disc height. Moderate bilateral facet hypertrophy. No spinal stenosis or significant foraminal narrowing.   IMPRESSION: 1. Multiple chronic thoracic  and lumbar compression deformities, with new subacute L3 inferior endplate and L4 biconcave  compression deformities demonstrating minimal residual edema. 2. Bilateral sacral insufficiency fractures. 3. Mild L3-L4 spinal stenosis with mild right and moderate left foraminal narrowing.   Electronically signed by: Elsie Perone MD 06/18/2024 10:07 AM EST RP Workstation: [id number]  I have personally reviewed the images and agree with the above interpretation.  Assessment and Plan: Ms. Bralley is doing better! She only has intermittent right ankle pain. No LBP or buttock pain. No right or left hip or leg pain. No numbness, tingling, or weakness. Pain is tolerable.    She has known history compression fractures at T11, T12, L1, L2, L5.   She has subacute fractures at L3 and L4 along with bilateral sacral insufficiency fractures. Also with mild central stenosis L3-L4 with mild right/moderate left foraminal stenosis and mild bilateral foraminal stenosis L4-L5.    I'm not convinced that her right ankle pain is lumbar mediated and may be more localized to ankle, however she states ankle is not tender.    Treatment options discussed with patient and following plan made:   - We discussed compression fractures and sacral insufficiency fractures. She has no pain in her back/buttock. Recommend she avoid bending, twisting, or lifting.  - Pain is improving so will follow along for now.  - She has f/u with me in 4-6 weeks for recheck. - If pain gets worse, she will let me know so I can see her back for good exam to check for ankle tenderness. Could consider referral to ortho and/or PMR if pain appeared more radicular.   Patient and her daughter in law Devere in agreement with above plan.   Glade Boys PA-C Neurosurgery

## 2024-06-25 ENCOUNTER — Ambulatory Visit (INDEPENDENT_AMBULATORY_CARE_PROVIDER_SITE_OTHER): Admitting: Orthopedic Surgery

## 2024-06-25 ENCOUNTER — Encounter: Payer: Self-pay | Admitting: Orthopedic Surgery

## 2024-06-25 DIAGNOSIS — M47816 Spondylosis without myelopathy or radiculopathy, lumbar region: Secondary | ICD-10-CM

## 2024-06-25 DIAGNOSIS — M8448XA Pathological fracture, other site, initial encounter for fracture: Secondary | ICD-10-CM

## 2024-06-25 DIAGNOSIS — S32030G Wedge compression fracture of third lumbar vertebra, subsequent encounter for fracture with delayed healing: Secondary | ICD-10-CM

## 2024-06-25 DIAGNOSIS — Z8781 Personal history of (healed) traumatic fracture: Secondary | ICD-10-CM | POA: Diagnosis not present

## 2024-06-25 DIAGNOSIS — M25571 Pain in right ankle and joints of right foot: Secondary | ICD-10-CM

## 2024-06-25 DIAGNOSIS — M48061 Spinal stenosis, lumbar region without neurogenic claudication: Secondary | ICD-10-CM | POA: Diagnosis not present

## 2024-06-25 DIAGNOSIS — M5416 Radiculopathy, lumbar region: Secondary | ICD-10-CM

## 2024-06-25 DIAGNOSIS — S32030D Wedge compression fracture of third lumbar vertebra, subsequent encounter for fracture with routine healing: Secondary | ICD-10-CM | POA: Diagnosis not present

## 2024-06-25 DIAGNOSIS — S32040D Wedge compression fracture of fourth lumbar vertebra, subsequent encounter for fracture with routine healing: Secondary | ICD-10-CM | POA: Diagnosis not present

## 2024-07-23 ENCOUNTER — Other Ambulatory Visit: Payer: Self-pay | Admitting: Family Medicine

## 2024-07-23 DIAGNOSIS — M5416 Radiculopathy, lumbar region: Secondary | ICD-10-CM

## 2024-08-01 NOTE — Progress Notes (Unsigned)
 "  Referring Physician:  Gasper Nancyann BRAVO, MD [address] [place],  [place] [address]  Primary Physician:  Gasper Nancyann BRAVO, MD  History of Present Illness: Anna Johnson has a history of HTN, osteoporosis, hyperlipidemia, open angle glaucoma, breast CA.   Son helps with history as needed. ***  She did a phone visit with me on 06/25/24 and she was doing much better! No LBP or buttock pain. She had some intermittent right ankle pain.   She has a history of compression fractures at T11, T12, L1, L2, L5.    She has known subacute fractures at L3 and L4 along with bilateral sacral insufficiency fractures. Also with mild central stenosis L3-L4 with mild right/moderate left foraminal stenosis and mild bilateral foraminal stenosis L4-L5.   She was to avoid bending, twisting, or lifting. She is here for a follow up.          She had an injury to back in September when she was sitting on stairs and hit her back, no fall. She started with right leg pain the following day.   She has no LBP with intermittent right leg pain in her entire leg, mostly lateral and anterior, to her ankle. She's had twinges of left anterior thigh pain. She has good days and bad days. She has weakness in right leg. Pain in leg is generally worse in the morning when she gets out of bed. She has known neuropathy, but states she has no numbness or tingling.   History of compression fractures noted on lumbar xrays.   Started on neurontin  300mg  bid by PCP on 05/11/24- this helps.    Tobacco use: Does not smoke.   Bowel/Bladder Dysfunction: none  Conservative measures:  Physical therapy:  did some PT through [facility] from 12/18/23 to 04/23/24 for her balance/weakness and was discharged Multimodal medical therapy including regular antiinflammatories:  tylenol, gabapentin  Injections:  no epidural steroid injections  Past Surgery: no spinal surgeries  The symptoms are causing a significant impact on the  patient's life.   Review of Systems:  A 10 point review of systems is negative, except for the pertinent positives and negatives detailed in the HPI.  Past Medical History: Past Medical History:  Diagnosis Date   Allergy    Glaucoma    Hyperlipidemia    Hypertension    Osteoporosis    Pneumonia 03/29/2015    Past Surgical History: Past Surgical History:  Procedure Laterality Date   BREAST BIOPSY Left 03/16/2018   Affirm Biopsy- Ribbon clip- positive   BREAST LUMPECTOMY Left 04/2018   CATARACT EXTRACTION  2004   DILATION AND CURETTAGE OF UTERUS     HIP FRACTURE SURGERY  09/15/2010   left hip   WRIST FRACTURE SURGERY     ORIF    Allergies: Allergies as of 08/03/2024 - Review Complete 06/25/2024  Allergen Reaction Noted   Codeine Other (See Comments) 03/29/2015   Seasonal ic [cholestatin]  07/17/2017   Covera-hs [verapamil] Rash 03/29/2015    Medications: Outpatient Encounter Medications as of 08/03/2024  Medication Sig   Acetaminophen 500 MG capsule Take 2 capsules by mouth as needed for fever.   amLODipine  (NORVASC ) 5 MG tablet TAKE 1 TABLET BY MOUTH DAILY   aspirin 81 MG tablet Take 81 mg by mouth daily.   benazepril  (LOTENSIN ) 10 MG tablet Take 1 tablet (10 mg total) by mouth daily.   calcitonin, salmon, (MIACALCIN/FORTICAL) 200 UNIT/ACT nasal spray PLACE 1 SPRAY INTO ALTERNATE NOSTRILS DAILY  donepezil  (ARICEPT ) 5 MG tablet Take 1 tablet (5 mg total) by mouth at bedtime.   dorzolamide-timolol (COSOPT) 22.3-6.8 MG/ML ophthalmic solution    fluticasone  (FLONASE ) 50 MCG/ACT nasal spray USE TWO SPRAYS IN EACH NOSTRIL EACH DAY AS DIRECTED BY PHYSICIAN (Patient taking differently: Place 2 sprays into both nostrils as needed for allergies. USE TWO SPRAYS IN EACH NOSTRIL EACH DAY AS DIRECTED BY PHYSICIAN TAKES PRN)   gabapentin  (NEURONTIN ) 300 MG capsule TAKE ONE CAPSULE BY MOUTH TWICE A DAY   hydrochlorothiazide  (HYDRODIURIL ) 25 MG tablet Take 25 mg by mouth daily.    latanoprost (XALATAN) 0.005 % ophthalmic solution Place 1 drop into both eyes at bedtime.   loratadine (CLARITIN) 10 MG tablet Take 10 mg by mouth daily as needed for allergies.   lovastatin  (MEVACOR ) 40 MG tablet TAKE 1 TABLET BY MOUTH AT BEDTIME   LUMIGAN 0.01 % SOLN Place 1 drop into both eyes at bedtime.    metoprolol  succinate (TOPROL -XL) 50 MG 24 hr tablet TAKE 1 TABLET BY MOUTH DAILY WITH ANY MEAL OR IMMEDIATELY FOLLOWING A MEAL   povidone-iodine  (BETADINE ) 10 % external solution Apply 1 Application topically as needed for wound care.   timolol (TIMOPTIC) 0.5 % ophthalmic solution Apply to eye.   No facility-administered encounter medications on file as of 08/03/2024.    Social History: Social History   Tobacco Use   Smoking status: Never   Smokeless tobacco: Never  Vaping Use   Vaping status: Never Used  Substance Use Topics   Alcohol use: Yes    Alcohol/week: 1.0 standard drink of alcohol    Types: 1 Glasses of wine per week    Comment: Occasionally   Drug use: No    Family Medical History: Family History  Problem Relation Age of Onset   Uterine cancer Mother    Heart attack Father    Healthy Brother    Cancer Son        colon, died from sx   Breast cancer Neg Hx     Physical Examination: There were no vitals filed for this visit.    Awake, alert, oriented to person, place, and time.  Speech is clear and fluent. Fund of knowledge is appropriate.   Cranial Nerves: Pupils equal round and reactive to light.  Facial tone is symmetric.    No posterior thoracic or lumbar tenderness.   No abnormal lesions on exposed skin.   Strength: Side Iliopsoas Quads Hamstring PF DF EHL  R 5 5 5 5 5 5   L 5 5 5 5 5 5    Reflexes are 1+ and symmetric at the patella and achilles.    Clonus is not present.   Bilateral lower extremity sensation is intact to light touch.     No pain with IR/ER of both hips.   No tenderness over greater trochanteric bursa bilaterally.   She  ambulates slowly with a cane.   Medical Decision Making  Imaging: Lumbar xrays dated ***:  ***  Report for above xrays not yet available.    Assessment and Plan: Anna Johnson is doing better! She only has intermittent right ankle pain. No LBP or buttock pain. No right or left hip or leg pain. No numbness, tingling, or weakness. Pain is tolerable.    She has known history compression fractures at T11, T12, L1, L2, L5.   She has subacute fractures at L3 and L4 along with bilateral sacral insufficiency fractures. Also with mild central stenosis L3-L4 with mild right/moderate left  foraminal stenosis and mild bilateral foraminal stenosis L4-L5.    I'm not convinced that her right ankle pain is lumbar mediated and may be more localized to ankle, however she states ankle is not tender.    Treatment options discussed with patient and following plan made:   - We discussed compression fractures and sacral insufficiency fractures. She has no pain in her back/buttock. Recommend she avoid bending, twisting, or lifting.  - Pain is improving so will follow along for now.  - She has f/u with me in 4-6 weeks for recheck. - If pain gets worse, she will let me know so I can see her back for good exam to check for ankle tenderness. Could consider referral to ortho and/or PMR if pain appeared more radicular.   I spent a total of 35 minutes in face-to-face and non-face-to-face activities related to this patient's care today including review of outside records, review of imaging, review of symptoms, physical exam, discussion of differential diagnosis, discussion of treatment options, and documentation.   Glade Boys PA-C Neurosurgery "

## 2024-08-02 ENCOUNTER — Other Ambulatory Visit: Payer: Self-pay

## 2024-08-02 DIAGNOSIS — S32030G Wedge compression fracture of third lumbar vertebra, subsequent encounter for fracture with delayed healing: Secondary | ICD-10-CM

## 2024-08-03 ENCOUNTER — Encounter: Payer: Self-pay | Admitting: Orthopedic Surgery

## 2024-08-03 ENCOUNTER — Ambulatory Visit

## 2024-08-03 ENCOUNTER — Encounter: Payer: Self-pay | Admitting: Podiatry

## 2024-08-03 ENCOUNTER — Ambulatory Visit (INDEPENDENT_AMBULATORY_CARE_PROVIDER_SITE_OTHER): Admitting: Orthopedic Surgery

## 2024-08-03 ENCOUNTER — Ambulatory Visit: Admitting: Podiatry

## 2024-08-03 VITALS — BP 110/70 | Wt 94.0 lb

## 2024-08-03 VITALS — Ht 59.5 in | Wt 94.0 lb

## 2024-08-03 DIAGNOSIS — Z8781 Personal history of (healed) traumatic fracture: Secondary | ICD-10-CM

## 2024-08-03 DIAGNOSIS — S32040D Wedge compression fracture of fourth lumbar vertebra, subsequent encounter for fracture with routine healing: Secondary | ICD-10-CM | POA: Diagnosis not present

## 2024-08-03 DIAGNOSIS — M79675 Pain in left toe(s): Secondary | ICD-10-CM

## 2024-08-03 DIAGNOSIS — Z01818 Encounter for other preprocedural examination: Secondary | ICD-10-CM | POA: Diagnosis not present

## 2024-08-03 DIAGNOSIS — M79674 Pain in right toe(s): Secondary | ICD-10-CM | POA: Diagnosis not present

## 2024-08-03 DIAGNOSIS — B351 Tinea unguium: Secondary | ICD-10-CM

## 2024-08-03 DIAGNOSIS — S32030G Wedge compression fracture of third lumbar vertebra, subsequent encounter for fracture with delayed healing: Secondary | ICD-10-CM

## 2024-08-03 DIAGNOSIS — S32030D Wedge compression fracture of third lumbar vertebra, subsequent encounter for fracture with routine healing: Secondary | ICD-10-CM

## 2024-08-03 DIAGNOSIS — M48061 Spinal stenosis, lumbar region without neurogenic claudication: Secondary | ICD-10-CM | POA: Diagnosis not present

## 2024-08-03 MED ORDER — MUPIROCIN 2 % EX OINT: 1.0000 | TOPICAL_OINTMENT | Freq: Two times a day (BID) | CUTANEOUS | 1 refills | Status: AC

## 2024-08-03 MED ORDER — DOXYCYCLINE HYCLATE 100 MG PO TABS: 100.0000 mg | ORAL_TABLET | Freq: Two times a day (BID) | ORAL | 0 refills | Status: AC

## 2024-08-03 NOTE — Progress Notes (Signed)
 "  Chief Complaint  Patient presents with   Callouses    Pt is here due to callous to the side of the right pinky toe, states she has always had trouble to the foot, would like to have it shaved off.    HPI: 83 y.o. female presenting today for follow-up evaluation of pain and tenderness associated to the right fifth toe.  Patient states that the skin lesions to the plantar aspect of the foot have returned and are becoming symptomatic although she has been wearing the wide shoes.  Also requesting nail debridement today.  Past Medical History:  Diagnosis Date   Allergy    Glaucoma    Hyperlipidemia    Hypertension    Osteoporosis    Pneumonia 03/29/2015    Past Surgical History:  Procedure Laterality Date   BREAST BIOPSY Left 03/16/2018   Affirm Biopsy- Ribbon clip- positive   BREAST LUMPECTOMY Left 04/2018   CATARACT EXTRACTION  2004   DILATION AND CURETTAGE OF UTERUS     HIP FRACTURE SURGERY  09/15/2010   left hip   WRIST FRACTURE SURGERY     ORIF    Allergies  Allergen Reactions   Codeine Other (See Comments)   Seasonal Ic [Cholestatin]    Covera-Hs [Verapamil] Rash    Physical Exam: General: The patient is alert and oriented x3 in no acute distress.  Dermatology: No open wounds noted.  Hyperkeratotic callus tissue noted between the 4th and 5th digit right foot.  Hyperkeratotic dystrophic nails also noted 1-5 bilateral  Vascular: Palpable pedal pulses bilaterally. Capillary refill within normal limits.  No appreciable edema.  No erythema.  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  Adductovarus hammertoe fifth digit right foot 3.  Pain due to onychomycosis of toenails both  -Patient evaluated - Unfortunately the patient continues to have significant pain and  tenderness associated to the right fifth toe despite conservative treatment including wider shoes and routine debridement.  Again today we did discuss the possibility of hammertoe corrective surgery to the right foot.  The procedure would consist of a PIPJ arthroplasty to alleviate pressure associated to the head of the proximal phalanx of the fifth toe.  This can be performed in the office procedure room.  Today again we discussed the risk benefits advantages and disadvantages of the procedure as well as the postoperative recovery course.  Despite the patient's age it is very frustrating and symptomatic for her and she would like to proceed with surgery at this time. - Excisional debridement of the hyperkeratotic callus tissue was performed today using 312 scalpel and tissue nipper without incident or bleeding.  Patient did feel some relief.  Salicylic acid and Band-Aid applied -Mechanical debridement of nails 1-5 bilateral performed using a nail nipper without incident or bleeding - Authorization for an office procedure was initiated today.  The in office surgery will consist of PIPJ arthroplasty of the fifth digit right foot -Return to clinic [place] office morning of procedure      Thresa EMERSON Sar, DPM [facility]  Dr. Thresa EMERSON Sar, DPM    [address]  [place], [place] [address]                Office [phone]  Fax [phone]     "

## 2024-08-20 ENCOUNTER — Other Ambulatory Visit: Payer: Self-pay | Admitting: Family Medicine

## 2024-08-20 DIAGNOSIS — I10 Essential (primary) hypertension: Secondary | ICD-10-CM

## 2024-09-08 ENCOUNTER — Ambulatory Visit (INDEPENDENT_AMBULATORY_CARE_PROVIDER_SITE_OTHER)

## 2024-09-08 ENCOUNTER — Ambulatory Visit: Admitting: Podiatry

## 2024-09-08 VITALS — Ht 59.5 in | Wt 94.0 lb

## 2024-09-08 DIAGNOSIS — M205X1 Other deformities of toe(s) (acquired), right foot: Secondary | ICD-10-CM | POA: Diagnosis not present

## 2024-09-08 NOTE — Progress Notes (Signed)
" ° °  Chief Complaint  Patient presents with   Foot Pain    RM 6 Pain in R foot, 5th toe- last seen in [place] office  Consult regarding PIPJ arthroplasty to alleviate pressure; possibility of hammertoe corrective surgery to the right foot    HPI: 84 y.o. female presenting today for follow-up evaluation of pain and tenderness associated to the right fifth toe.  According to the patient she was scheduled today for the procedure however this is irregular appointment time and not an early in office procedure time which is normally scheduled for 7:30 AM.  Past Medical History:  Diagnosis Date   Allergy    Glaucoma    Hyperlipidemia    Hypertension    Osteoporosis    Pneumonia 03/29/2015    Past Surgical History:  Procedure Laterality Date   BREAST BIOPSY Left 03/16/2018   Affirm Biopsy- Ribbon clip- positive   BREAST LUMPECTOMY Left 04/2018   CATARACT EXTRACTION  2004   DILATION AND CURETTAGE OF UTERUS     HIP FRACTURE SURGERY  09/15/2010   left hip   WRIST FRACTURE SURGERY     ORIF    Allergies  Allergen Reactions   Codeine Other (See Comments)   Seasonal Ic [Cholestatin]    Covera-Hs [Verapamil] Rash    Physical Exam: General: The patient is alert and oriented x3 in no acute distress.  Dermatology: No open wounds noted.  Hyperkeratotic callus tissue noted between the 4th and 5th digit right foot.  Hyperkeratotic dystrophic nails also noted 1-5 bilateral  Vascular: Palpable pedal pulses bilaterally. Capillary refill within normal limits.  No appreciable edema.  No erythema.  Neurological: Grossly intact via light touch  Musculoskeletal Exam: Unchanged.  Adductovarus hammertoe deformity noted to the fifth digit of the foot  Assessment/Plan of Care: 1.  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  Adductovarus hammertoe fifth digit right foot  -Patient evaluated - Unfortunately there was some confusion with scheduling her here in the  [place] office.  She was scheduled for a regular routine office appointment instead of an early a.m. in office procedure appointment.   -We were able to get her rescheduled today to have an office procedure performed on Wednesday, 09/15/2024, at 7:30 AM for an office procedure.  Arrive by 715.  Our surgery scheduler discussed this with the patient and they are fine with that appt time.  -Return to clinic 09/15/2024 at 7:15 AM for an office procedure which will consist of PIPJ arthroplasty with derotational skin plasty to the fifth digit of the right foot      Thresa EMERSON Sar, DPM [facility]  Dr. Thresa EMERSON Sar, DPM    [address] [place], [place] [address]                Office [phone]  Fax [phone]     "

## 2024-09-09 ENCOUNTER — Telehealth: Payer: Self-pay | Admitting: Podiatry

## 2024-09-09 NOTE — Telephone Encounter (Signed)
 DOS- 09/15/2024  HAMMERTOE REPAIR 5TH RT- 71714  [employer] EFFECTIVE DATE- 08/12/2024  DEDUCTIBLE- N/A OOP- $4000 REMAINING- $4000 COINSURANCE- 0%  PER COHERE PORTAL, PRIOR AUTH FOR CPT CODE 71714 HAS BEEN APPROVED FROM 09/15/2024-12/13/2024. AUTH# [id number]

## 2024-09-15 ENCOUNTER — Encounter: Payer: Self-pay | Admitting: Podiatry

## 2024-09-15 ENCOUNTER — Ambulatory Visit: Admitting: Podiatry

## 2024-09-15 ENCOUNTER — Ambulatory Visit: Admitting: Family Medicine

## 2024-09-15 VITALS — BP 131/50 | HR 77 | Temp 98.8°F | Resp 16 | Ht 59.5 in | Wt 94.0 lb

## 2024-09-15 DIAGNOSIS — E559 Vitamin D deficiency, unspecified: Secondary | ICD-10-CM

## 2024-09-15 DIAGNOSIS — M2041 Other hammer toe(s) (acquired), right foot: Secondary | ICD-10-CM

## 2024-09-15 DIAGNOSIS — E785 Hyperlipidemia, unspecified: Secondary | ICD-10-CM

## 2024-09-15 DIAGNOSIS — I10 Essential (primary) hypertension: Secondary | ICD-10-CM

## 2024-09-15 DIAGNOSIS — M810 Age-related osteoporosis without current pathological fracture: Secondary | ICD-10-CM

## 2024-09-15 DIAGNOSIS — Z1231 Encounter for screening mammogram for malignant neoplasm of breast: Secondary | ICD-10-CM

## 2024-09-15 MED ORDER — HYDROCODONE-ACETAMINOPHEN 5-325 MG PO TABS: 1.0000 | ORAL_TABLET | Freq: Four times a day (QID) | ORAL | 0 refills | Status: AC | PRN

## 2024-09-15 NOTE — Progress Notes (Signed)
" ° °  OPERATIVE REPORT Patient name: Anna Johnson MRN: [id number] DOB: [birth year]  DOS:  09/15/24  Preop Dx: Symptomatic hammertoe fifth digit right foot Postop Dx: same  Procedure:  1. PIPJ arthroplasty with derotational arthroplasty fifth digit right foot  Surgeon: Thresa EMERSON Sar DPM  Anesthesia: 2% lidocaine plain totaling 3 mL infiltrated around the surgical area  Hemostasis: None  EBL: Minimal mL Materials: None Injectables: None Pathology: none  Condition: The patient tolerated the procedure and anesthesia well. No complications noted or reported   Justification for procedure: The patient is a 84 y.o. female who presents today for correction of symptomatic hammertoe of the fifth digit right foot. Conservative modalities of been unsuccessful in providing any sort of satisfactory alleviation of symptoms with the patient. The patient was told benefits as well as possible side effects of the surgery. The patient consented for surgical correction. The patient consent form was reviewed. All patient questions were answered. No guarantees were expressed or implied.   Procedure in Detail: The patient was brought to the procedure room, placed in the procedure chair in the supine position at which time an aseptic scrub and drape were performed about the patient's respective lower extremity after anesthesia was induced as described above. Attention was then directed to the surgical area where procedure number one commenced.  Procedure #1: PIPJ arthroplasty with derotational skin plasty fifth digit right foot A 1.5 cm obliquely oriented elliptical skin wedge was planned and made overlying the PIPJ and hypertrophic head of the proximal phalanx using #15 scalpel.  The skin wedge was removed as well as the underlying portion of the EDL tendon.  This exposed the underlying PIPJ and capsulotomy was performed using a #15 scalpel as well as release of the capsular tissue around the hypertrophic head  of the proximal phalanx.  Once the hypertrophic head of the proximal phalanx was exposed a bone cutting forcep was utilized to resect away leaving the head of the proximal phalanx at the phalangeal neck.  The hypertrophic head was removed in toto and irrigation was utilized in preparation for primary closure.  4-0 Vicryl suture was utilized to reapproximate the opposing ends of the EDL tendon followed by 4-0 nylon suture to reapproximate superficial skin edges.   Dry sterile compressive dressings were then applied about the patient's lower extremity. The patient was then discharged from the office with adequate prescriptions for analgesia. Verbal as well as written instructions were provided for the patient regarding postprocedural care. The patient is to keep the dressings clean dry and intact until they are to follow up in the office upon discharge in one week.   Thresa EMERSON Sar, DPM [facility]  Dr. Thresa EMERSON Sar, DPM    [address] [place], [place] [address]                Office [phone]  Fax [phone]   "

## 2024-09-21 ENCOUNTER — Ambulatory Visit: Admitting: Podiatry

## 2024-09-24 ENCOUNTER — Ambulatory Visit: Admitting: Podiatry

## 2024-10-05 ENCOUNTER — Ambulatory Visit: Admitting: Podiatry

## 2024-10-19 ENCOUNTER — Ambulatory Visit: Admitting: Podiatry

## 2025-05-10 ENCOUNTER — Ambulatory Visit
